# Patient Record
Sex: Male | Born: 1955 | Race: White | Hispanic: No | Marital: Married | Smoking: Former smoker
Health system: Southern US, Community
[De-identification: ages and names within clinical notes are randomized; demographics above are authoritative.]

## PROBLEM LIST (undated history)

## (undated) DIAGNOSIS — C61 Malignant neoplasm of prostate: Secondary | ICD-10-CM

## (undated) DIAGNOSIS — R351 Nocturia: Secondary | ICD-10-CM

## (undated) DIAGNOSIS — R399 Unspecified symptoms and signs involving the genitourinary system: Secondary | ICD-10-CM

## (undated) DIAGNOSIS — M1990 Unspecified osteoarthritis, unspecified site: Secondary | ICD-10-CM

## (undated) DIAGNOSIS — R350 Frequency of micturition: Secondary | ICD-10-CM

## (undated) DIAGNOSIS — R319 Hematuria, unspecified: Secondary | ICD-10-CM

## (undated) HISTORY — PX: PROSTATE BIOPSY: SHX241

## (undated) HISTORY — PX: ILEO CONDUIT: SHX1778

---

## 2009-05-13 ENCOUNTER — Emergency Department (HOSPITAL_COMMUNITY): Admission: EM | Admit: 2009-05-13 | Discharge: 2009-05-13 | Payer: Self-pay | Admitting: Emergency Medicine

## 2009-05-30 ENCOUNTER — Emergency Department (HOSPITAL_COMMUNITY): Admission: EM | Admit: 2009-05-30 | Discharge: 2009-05-30 | Payer: Self-pay | Admitting: Family Medicine

## 2012-01-08 ENCOUNTER — Emergency Department (HOSPITAL_COMMUNITY)
Admission: EM | Admit: 2012-01-08 | Discharge: 2012-01-08 | Disposition: A | Discharge status: Home / Self Care | Payer: Worker's Compensation | Attending: Emergency Medicine | Admitting: Emergency Medicine

## 2012-01-08 ENCOUNTER — Encounter (HOSPITAL_COMMUNITY): Payer: Self-pay

## 2012-01-08 DIAGNOSIS — W268XXA Contact with other sharp object(s), not elsewhere classified, initial encounter: Secondary | ICD-10-CM | POA: Insufficient documentation

## 2012-01-08 DIAGNOSIS — S61409A Unspecified open wound of unspecified hand, initial encounter: Secondary | ICD-10-CM | POA: Insufficient documentation

## 2012-01-08 DIAGNOSIS — IMO0002 Reserved for concepts with insufficient information to code with codable children: Secondary | ICD-10-CM

## 2012-01-08 MED ORDER — TETANUS-DIPHTH-ACELL PERTUSSIS 5-2.5-18.5 LF-MCG/0.5 IM SUSP
0.5000 mL | Freq: Once | INTRAMUSCULAR | Status: AC
  Administered 2012-01-08: 0.5 mL via INTRAMUSCULAR
  Filled 2012-01-08: qty 0.5

## 2012-01-08 NOTE — ED Notes (Signed)
When washing machine he cut the back of hand. X 2 1/2" lacerations on back of hand - behind rt. Pinky and rt. Ring finger; bleeding controlled.

## 2012-01-08 NOTE — Discharge Instructions (Signed)
Keep your wound clean and dry, you may wash it with soap and water.  Follow up with your Dr. in 2 weeks for suture removal.  Return for uncontrolled pain, spreading, swelling, fevers, or pus

## 2012-01-08 NOTE — ED Provider Notes (Signed)
History   This chart was scribed for Cheri Guppy, MD by Sofie Rower. The patient was seen in room TR05C/TR05C and the patient's care was started at 1:45 PM     CSN: [id number]  Arrival date & time 01/08/12  1234   First MD Initiated Contact with Patient 01/08/12 1344      Chief Complaint  Patient presents with  . Laceration    (Consider location/radiation/quality/duration/timing/severity/associated sxs/prior treatment) Patient is a 56 y.o. male presenting with skin laceration. The history is provided by the patient. No language interpreter was used.  Laceration  The incident occurred 3 to 5 hours ago. The laceration is located on the right hand. The laceration is 1 cm in size. The laceration mechanism was a a metal edge. The pain is moderate. The pain has been constant since onset. He reports no foreign bodies present.      History  Substance Use Topics  . Smoking status: Never Smoker   . Smokeless tobacco: Not on file  . Alcohol Use: Yes     occasionally      Review of Systems  Constitutional: Negative for fever.  Gastrointestinal: Negative for nausea and vomiting.  Skin: Positive for wound (Laceration). Negative for rash.    Allergies  Review of patient's allergies indicates no known allergies.  Home Medications  No current outpatient prescriptions on file.  BP 108/65  Pulse 87  Temp 97.6 F (36.4 C) (Oral)  Resp 12  SpO2 96%  Physical Exam  Nursing note and vitals reviewed. Constitutional: He appears well-developed and well-nourished.  Musculoskeletal: Normal range of motion. He exhibits no edema and no tenderness.        Two (2): 1 cm Lacerations over the 4th and 5th MCP joint on the right hand side. Clean, no bleeding.   Skin: Skin is warm and dry. No rash noted.  Psychiatric: He has a normal mood and affect. His behavior is normal.    ED Course  Procedures (including critical care time)  DIAGNOSTIC STUDIES: Oxygen Saturation is 96% on room  air, adequate by my interpretation.    COORDINATION OF CARE:  PROCEDURE Laceration repair X2 Location dorsum of left hand over 4-5 mcp. Superficial, clean, no active bleeding. No tendon nor nerve injuries. Betadine cleanse NS irrigation, moderate vol. With bulb syringe Lidocaine with epi anesthesia. 2 ml total Interrupted suture repair.  2 proline sutures in each lac, ( four total )  no complications.    Labs Reviewed - No data to display No results found.   No diagnosis found.    MDM  Left hand lac X2       I personally performed the services described in this documentation, which was scribed in my presence. The recorded information has been reviewed and considered.      Cheri Guppy, MD 01/08/12 1459

## 2012-01-22 ENCOUNTER — Emergency Department (HOSPITAL_COMMUNITY)
Admission: EM | Admit: 2012-01-22 | Discharge: 2012-01-22 | Disposition: A | Discharge status: Home / Self Care | Payer: Worker's Compensation | Attending: Emergency Medicine | Admitting: Emergency Medicine

## 2012-01-22 ENCOUNTER — Encounter (HOSPITAL_COMMUNITY): Payer: Self-pay | Admitting: Emergency Medicine

## 2012-01-22 DIAGNOSIS — IMO0002 Reserved for concepts with insufficient information to code with codable children: Secondary | ICD-10-CM

## 2012-01-22 DIAGNOSIS — Z4802 Encounter for removal of sutures: Secondary | ICD-10-CM | POA: Insufficient documentation

## 2012-01-22 NOTE — ED Notes (Signed)
Pt here for suture removal to R hand.  No complaints.

## 2012-01-22 NOTE — ED Notes (Signed)
Patient returns to have the sutures in his right hand removed.

## 2012-01-22 NOTE — ED Provider Notes (Signed)
History   This chart was scribed for Howard B. Bernette Mayers, MD by Toya Smothers. The patient was seen in room TR07C/TR07C. Patient's care was started at 1830.  CSN: [id number]  Arrival date & time 01/22/12  1830   First MD Initiated Contact with Patient 01/22/12 1853      Chief Complaint  Patient presents with  . Suture / Staple Removal   HPI  Howard Valentine is a 56 y.o. male who presents to the Emergency Department for remove sutures. Pt sutures for laceration to the  R hand 2 weeks ago. Pt is asymptomatic and lacerations are healing well.   History reviewed. No pertinent past medical history.  History reviewed. No pertinent past surgical history.  No family history on file.  History  Substance Use Topics  . Smoking status: Never Smoker   . Smokeless tobacco: Not on file  . Alcohol Use: Yes     occasionally    Review of Systems  Constitutional: Negative for fever and chills.  HENT: Negative for rhinorrhea and neck pain.   Eyes: Negative for pain.  Respiratory: Negative for cough and shortness of breath.   Cardiovascular: Negative for chest pain.  Gastrointestinal: Negative for nausea, vomiting, abdominal pain and diarrhea.  Genitourinary: Negative for dysuria.  Musculoskeletal: Negative for back pain.  Skin: Positive for wound (Healing laceration). Negative for rash.  Neurological: Negative for dizziness and weakness.  All other systems reviewed and are negative.    Allergies  Review of patient's allergies indicates no known allergies.  Home Medications  No current outpatient prescriptions on file.  BP 129/78  Pulse 86  Temp 98.2 F (36.8 C) (Oral)  Resp 20  SpO2 99%  Physical Exam  Nursing note and vitals reviewed. Constitutional: He is oriented to person, place, and time. He appears well-developed and well-nourished. No distress.  HENT:  Head: Normocephalic and atraumatic.  Eyes: EOM are normal. Pupils are equal, round, and reactive to light.  Neck: Neck  supple. No tracheal deviation present.  Cardiovascular: Normal rate.   Pulmonary/Chest: Effort normal. No respiratory distress.  Abdominal: Soft. He exhibits no distension.  Musculoskeletal: Normal range of motion. He exhibits no edema.  Neurological: He is alert and oriented to person, place, and time. No sensory deficit.  Skin: Skin is warm and dry.       2 small lacerations on the dorsal R hand without signs of infection.  Psychiatric: He has a normal mood and affect. His behavior is normal.    ED Course  Procedures (including critical care time) DIAGNOSTIC STUDIES: Oxygen Saturation is 99% on room air, normal by my interpretation.    COORDINATION OF CARE: 1856- Evaluated Pt and removed sutures.   Labs Reviewed - No data to display No results found.   No diagnosis found.    MDM  Sutures removed.      I personally performed the services described in the documentation, which were scribed in my presence. The recorded information has been reviewed and considered.     Howard B. Bernette Mayers, MD 01/22/12 6213

## 2012-11-22 ENCOUNTER — Other Ambulatory Visit (HOSPITAL_COMMUNITY): Payer: Self-pay | Admitting: Urology

## 2012-11-22 DIAGNOSIS — C61 Malignant neoplasm of prostate: Secondary | ICD-10-CM

## 2012-12-02 ENCOUNTER — Other Ambulatory Visit (HOSPITAL_COMMUNITY): Payer: Self-pay | Admitting: Urology

## 2012-12-02 ENCOUNTER — Ambulatory Visit (HOSPITAL_COMMUNITY)
Admission: RE | Admit: 2012-12-02 | Discharge: 2012-12-02 | Disposition: A | Discharge status: Home / Self Care | Payer: BC Managed Care – PPO | Source: Ambulatory Visit | Attending: Urology | Admitting: Urology

## 2012-12-02 DIAGNOSIS — S22009A Unspecified fracture of unspecified thoracic vertebra, initial encounter for closed fracture: Secondary | ICD-10-CM | POA: Insufficient documentation

## 2012-12-02 DIAGNOSIS — C61 Malignant neoplasm of prostate: Secondary | ICD-10-CM

## 2012-12-02 DIAGNOSIS — C7951 Secondary malignant neoplasm of bone: Secondary | ICD-10-CM | POA: Insufficient documentation

## 2012-12-02 DIAGNOSIS — X58XXXA Exposure to other specified factors, initial encounter: Secondary | ICD-10-CM | POA: Insufficient documentation

## 2012-12-02 MED ORDER — GADOBENATE DIMEGLUMINE 529 MG/ML IV SOLN
12.0000 mL | Freq: Once | INTRAVENOUS | Status: AC | PRN
  Administered 2012-12-02: 12 mL via INTRAVENOUS

## 2013-02-03 ENCOUNTER — Encounter (HOSPITAL_COMMUNITY): Payer: Self-pay | Admitting: Pharmacy Technician

## 2013-02-03 ENCOUNTER — Other Ambulatory Visit: Payer: Self-pay | Admitting: Urology

## 2013-02-03 NOTE — Patient Instructions (Addendum)
Howard Valentine  02/03/2013   Your procedure is scheduled on:  02/08/13                Surgery 0830am-222pm  Report to [facility] at    0630 AM.  Call this number if you have problems the morning of surgery: [phone]   Remember:   Do not eat food or drink liquids after midnight.   Take these medicines the morning of surgery with A SIP OF WATER:    Do not wear jewelry,  Do not wear lotions, powders, or perfumes.   . Men may shave face and neck.  Do not bring valuables to the hospital.  Contacts, dentures or bridgework may not be worn into surgery.  Leave suitcase in the car. After surgery it may be brought to your room.  For patients admitted to the hospital, checkout time is 11:00 AM the day of  discharge.       SEE CHG INSTRUCTION SHEET    Please read over the following fact sheets that you were given: , coughing and deep breathing exercises, leg exercises, Blood Transfusion Fact sheet                Failure to comply with these instructions may result in cancellation of your surgery.                Patient Signature ____________________________              Nurse Signature _____________________________

## 2013-02-06 ENCOUNTER — Encounter (HOSPITAL_COMMUNITY): Payer: Self-pay

## 2013-02-06 ENCOUNTER — Encounter (HOSPITAL_COMMUNITY)
Admission: RE | Admit: 2013-02-06 | Discharge: 2013-02-06 | Disposition: A | Discharge status: Home / Self Care | Payer: BC Managed Care – PPO | Source: Ambulatory Visit | Attending: Urology | Admitting: Urology

## 2013-02-06 HISTORY — DX: Unspecified osteoarthritis, unspecified site: M19.90

## 2013-02-06 LAB — CBC
HCT: 40.6 % (ref 39.0–52.0)
Platelets: 232 10*3/uL (ref 150–400)
WBC: 7.9 10*3/uL (ref 4.0–10.5)

## 2013-02-06 LAB — BASIC METABOLIC PANEL
BUN: 8 mg/dL (ref 6–23)
Chloride: 95 mEq/L — ABNORMAL LOW (ref 96–112)
GFR calc non Af Amer: >90 mL/min (ref >90)
Glucose, Bld: 89 mg/dL (ref 70–99)

## 2013-02-06 LAB — ABO/RH: ABO/RH(D): A POS

## 2013-02-08 ENCOUNTER — Ambulatory Visit (HOSPITAL_COMMUNITY)
Admission: RE | Admit: 2013-02-08 | Discharge: 2013-02-09 | Disposition: A | Discharge status: Home / Self Care | Payer: BC Managed Care – PPO | Source: Ambulatory Visit | Attending: Urology | Admitting: Urology

## 2013-02-08 ENCOUNTER — Encounter (HOSPITAL_COMMUNITY): Payer: Self-pay | Admitting: *Deleted

## 2013-02-08 ENCOUNTER — Encounter (HOSPITAL_COMMUNITY): Payer: Self-pay | Admitting: Anesthesiology

## 2013-02-08 ENCOUNTER — Encounter (HOSPITAL_COMMUNITY)
Admission: RE | Disposition: A | Discharge status: Home / Self Care | Payer: Self-pay | Source: Ambulatory Visit | Attending: Urology

## 2013-02-08 ENCOUNTER — Ambulatory Visit (HOSPITAL_COMMUNITY): Payer: BC Managed Care – PPO | Admitting: Anesthesiology

## 2013-02-08 DIAGNOSIS — C61 Malignant neoplasm of prostate: Secondary | ICD-10-CM | POA: Insufficient documentation

## 2013-02-08 DIAGNOSIS — Z01812 Encounter for preprocedural laboratory examination: Secondary | ICD-10-CM | POA: Insufficient documentation

## 2013-02-08 HISTORY — PX: LYMPHADENECTOMY: SHX5960

## 2013-02-08 HISTORY — PX: ROBOT ASSISTED LAPAROSCOPIC RADICAL PROSTATECTOMY: SHX5141

## 2013-02-08 LAB — CBC
MCH: 33 pg (ref 26.0–34.0)
Platelets: 203 10*3/uL (ref 150–400)
RBC: 4.03 MIL/uL — ABNORMAL LOW (ref 4.22–5.81)

## 2013-02-08 LAB — TYPE AND SCREEN
ABO/RH(D): A POS
Antibody Screen: NEGATIVE

## 2013-02-08 LAB — CREATININE, SERUM: Creatinine, Ser: 0.74 mg/dL (ref 0.50–1.35)

## 2013-02-08 SURGERY — ROBOTIC ASSISTED LAPAROSCOPIC RADICAL PROSTATECTOMY LEVEL 2
Anesthesia: General | Wound class: Clean Contaminated

## 2013-02-08 MED ORDER — ACETAMINOPHEN 10 MG/ML IV SOLN
1000.0000 mg | Freq: Four times a day (QID) | INTRAVENOUS | Status: DC
  Administered 2013-02-08 – 2013-02-09 (×3): 1000 mg via INTRAVENOUS
  Filled 2013-02-08 (×4): qty 100

## 2013-02-08 MED ORDER — HEPARIN SODIUM (PORCINE) 5000 UNIT/ML IJ SOLN
5000.0000 [IU] | INTRAMUSCULAR | Status: AC
  Administered 2013-02-08: 5000 [IU] via SUBCUTANEOUS
  Filled 2013-02-08: qty 1

## 2013-02-08 MED ORDER — NEOSTIGMINE METHYLSULFATE 1 MG/ML IJ SOLN
INTRAMUSCULAR | Status: DC | PRN
  Administered 2013-02-08: 4 mg via INTRAVENOUS

## 2013-02-08 MED ORDER — HYDROMORPHONE HCL PF 1 MG/ML IJ SOLN
0.2500 mg | INTRAMUSCULAR | Status: DC | PRN
  Administered 2013-02-08: 0.5 mg via INTRAVENOUS

## 2013-02-08 MED ORDER — ACETAMINOPHEN 325 MG PO TABS: 650.0000 mg | ORAL_TABLET | ORAL | Status: DC | PRN

## 2013-02-08 MED ORDER — SODIUM CHLORIDE 0.9 % IV BOLUS (SEPSIS)
1000.0000 mL | Freq: Once | INTRAVENOUS | Status: AC
  Administered 2013-02-08: 1000 mL via INTRAVENOUS

## 2013-02-08 MED ORDER — DEXAMETHASONE SODIUM PHOSPHATE 10 MG/ML IJ SOLN
INTRAMUSCULAR | Status: DC | PRN
  Administered 2013-02-08: 10 mg via INTRAVENOUS

## 2013-02-08 MED ORDER — ONDANSETRON HCL 4 MG/2ML IJ SOLN: 4.0000 mg | INTRAMUSCULAR | Status: DC | PRN

## 2013-02-08 MED ORDER — SODIUM CHLORIDE 0.9 % IR SOLN
Status: DC | PRN
  Administered 2013-02-08: 1000 mL

## 2013-02-08 MED ORDER — HEPARIN SODIUM (PORCINE) 5000 UNIT/ML IJ SOLN
5000.0000 [IU] | Freq: Three times a day (TID) | INTRAMUSCULAR | Status: DC
  Administered 2013-02-08 – 2013-02-09 (×2): 5000 [IU] via SUBCUTANEOUS
  Filled 2013-02-08 (×5): qty 1

## 2013-02-08 MED ORDER — HEPARIN SODIUM (PORCINE) 1000 UNIT/ML IJ SOLN
INTRAMUSCULAR | Status: AC
  Filled 2013-02-08: qty 1

## 2013-02-08 MED ORDER — MIDAZOLAM HCL 5 MG/5ML IJ SOLN
INTRAMUSCULAR | Status: DC | PRN
  Administered 2013-02-08: 2 mg via INTRAVENOUS

## 2013-02-08 MED ORDER — GLYCOPYRROLATE 0.2 MG/ML IJ SOLN
INTRAMUSCULAR | Status: DC | PRN
  Administered 2013-02-08: .6 mg via INTRAVENOUS

## 2013-02-08 MED ORDER — FENTANYL CITRATE 0.05 MG/ML IJ SOLN
INTRAMUSCULAR | Status: DC | PRN
  Administered 2013-02-08: 50 ug via INTRAVENOUS
  Administered 2013-02-08: 100 ug via INTRAVENOUS
  Administered 2013-02-08 (×7): 50 ug via INTRAVENOUS

## 2013-02-08 MED ORDER — INDIGOTINDISULFONATE SODIUM 8 MG/ML IJ SOLN
INTRAMUSCULAR | Status: AC
  Filled 2013-02-08: qty 10

## 2013-02-08 MED ORDER — SENNOSIDES-DOCUSATE SODIUM 8.6-50 MG PO TABS
1.0000 | ORAL_TABLET | Freq: Two times a day (BID) | ORAL | Status: DC
  Administered 2013-02-08: 1 via ORAL
  Filled 2013-02-08 (×3): qty 1

## 2013-02-08 MED ORDER — ACETAMINOPHEN 160 MG/5ML PO SOLN: 650.0000 mg | ORAL | Status: DC | PRN

## 2013-02-08 MED ORDER — BISACODYL 10 MG RE SUPP
10.0000 mg | Freq: Two times a day (BID) | RECTAL | Status: DC
  Administered 2013-02-08 – 2013-02-09 (×2): 10 mg via RECTAL
  Filled 2013-02-08 (×2): qty 1

## 2013-02-08 MED ORDER — LACTATED RINGERS IV SOLN
INTRAVENOUS | Status: DC | PRN
  Administered 2013-02-08: 09:00:00

## 2013-02-08 MED ORDER — HYDROMORPHONE HCL PF 1 MG/ML IJ SOLN
INTRAMUSCULAR | Status: AC
  Filled 2013-02-08: qty 1

## 2013-02-08 MED ORDER — OXYCODONE-ACETAMINOPHEN 5-325 MG/5ML PO SOLN: 5.0000 mL | ORAL | Status: DC | PRN

## 2013-02-08 MED ORDER — BACITRACIN-NEOMYCIN-POLYMYXIN 400-5-5000 EX OINT: 1.0000 "application " | TOPICAL_OINTMENT | Freq: Three times a day (TID) | CUTANEOUS | Status: DC | PRN

## 2013-02-08 MED ORDER — MORPHINE SULFATE 2 MG/ML IJ SOLN
2.0000 mg | INTRAMUSCULAR | Status: DC | PRN
  Administered 2013-02-08: 4 mg via INTRAVENOUS
  Filled 2013-02-08: qty 2

## 2013-02-08 MED ORDER — SODIUM CHLORIDE 0.9 % IJ SOLN: 3.0000 mL | Freq: Two times a day (BID) | INTRAMUSCULAR | Status: DC

## 2013-02-08 MED ORDER — INDIGOTINDISULFONATE SODIUM 8 MG/ML IJ SOLN
INTRAMUSCULAR | Status: DC | PRN
  Administered 2013-02-08 (×2): 5 mL via INTRAVENOUS

## 2013-02-08 MED ORDER — CEFAZOLIN SODIUM-DEXTROSE 2-3 GM-% IV SOLR
2.0000 g | INTRAVENOUS | Status: AC
  Administered 2013-02-08: 2 g via INTRAVENOUS

## 2013-02-08 MED ORDER — HYOSCYAMINE SULFATE 0.125 MG SL SUBL
0.1250 mg | SUBLINGUAL_TABLET | SUBLINGUAL | Status: DC | PRN
  Filled 2013-02-08: qty 1

## 2013-02-08 MED ORDER — SODIUM CHLORIDE 0.9 % IV SOLN
INTRAVENOUS | Status: DC
  Administered 2013-02-08 – 2013-02-09 (×2): via INTRAVENOUS

## 2013-02-08 MED ORDER — KETOROLAC TROMETHAMINE 30 MG/ML IJ SOLN: 15.0000 mg | Freq: Once | INTRAMUSCULAR | Status: DC | PRN

## 2013-02-08 MED ORDER — BUPIVACAINE-EPINEPHRINE PF 0.25-1:200000 % IJ SOLN
INTRAMUSCULAR | Status: AC
  Filled 2013-02-08: qty 30

## 2013-02-08 MED ORDER — PROMETHAZINE HCL 25 MG/ML IJ SOLN: 6.2500 mg | INTRAMUSCULAR | Status: DC | PRN

## 2013-02-08 MED ORDER — ROCURONIUM BROMIDE 100 MG/10ML IV SOLN
INTRAVENOUS | Status: DC | PRN
  Administered 2013-02-08 (×4): 10 mg via INTRAVENOUS
  Administered 2013-02-08: 40 mg via INTRAVENOUS
  Administered 2013-02-08: 20 mg via INTRAVENOUS
  Administered 2013-02-08 (×2): 10 mg via INTRAVENOUS

## 2013-02-08 MED ORDER — BUPIVACAINE LIPOSOME 1.3 % IJ SUSP
20.0000 mL | Freq: Once | INTRAMUSCULAR | Status: DC
  Filled 2013-02-08: qty 20

## 2013-02-08 MED ORDER — PROPOFOL 10 MG/ML IV BOLUS
INTRAVENOUS | Status: DC | PRN
  Administered 2013-02-08: 140 mg via INTRAVENOUS

## 2013-02-08 MED ORDER — ONDANSETRON HCL 4 MG/2ML IJ SOLN
INTRAMUSCULAR | Status: DC | PRN
  Administered 2013-02-08: 4 mg via INTRAVENOUS

## 2013-02-08 MED ORDER — SODIUM CHLORIDE 0.9 % IJ SOLN: 3.0000 mL | INTRAMUSCULAR | Status: DC | PRN

## 2013-02-08 MED ORDER — CEFAZOLIN SODIUM-DEXTROSE 2-3 GM-% IV SOLR
INTRAVENOUS | Status: AC
Start: 2013-02-08 — End: 2013-02-08
  Filled 2013-02-08: qty 50

## 2013-02-08 MED ORDER — LACTATED RINGERS IV SOLN
INTRAVENOUS | Status: DC
  Administered 2013-02-08: 1000 mL via INTRAVENOUS
  Administered 2013-02-08: 10:00:00 via INTRAVENOUS

## 2013-02-08 MED ORDER — CEFAZOLIN SODIUM-DEXTROSE 2-3 GM-% IV SOLR
2.0000 g | Freq: Three times a day (TID) | INTRAVENOUS | Status: AC
  Administered 2013-02-08 – 2013-02-09 (×2): 2 g via INTRAVENOUS
  Filled 2013-02-08 (×2): qty 50

## 2013-02-08 MED ORDER — BUPIVACAINE LIPOSOME 1.3 % IJ SUSP
INTRAMUSCULAR | Status: DC | PRN
  Administered 2013-02-08: 20 mL

## 2013-02-08 MED ORDER — CEFAZOLIN SODIUM-DEXTROSE 2-3 GM-% IV SOLR
INTRAVENOUS | Status: AC
  Filled 2013-02-08: qty 50

## 2013-02-08 MED ORDER — SODIUM CHLORIDE 0.9 % IV SOLN: 250.0000 mL | INTRAVENOUS | Status: DC | PRN

## 2013-02-08 SURGICAL SUPPLY — 49 items
APPLIER CLIP 5 13 M/L LIGAMAX5 (MISCELLANEOUS) ×3
CANISTER SUCTION 2500CC (MISCELLANEOUS) ×3 IMPLANT
CATH FOLEY 2WAY SLVR 30CC 16FR (CATHETERS) ×3 IMPLANT
CATH ROBINSON RED A/P 16FR (CATHETERS) ×3 IMPLANT
CATH ROBINSON RED A/P 8FR (CATHETERS) ×3 IMPLANT
CATH TIEMANN FOLEY 18FR 5CC (CATHETERS) ×3 IMPLANT
CHLORAPREP W/TINT 26ML (MISCELLANEOUS) ×3 IMPLANT
CLIP APPLIE 5 13 M/L LIGAMAX5 (MISCELLANEOUS) ×2 IMPLANT
CLIP LIGATING HEM O LOK PURPLE (MISCELLANEOUS) ×12 IMPLANT
CLOTH BEACON ORANGE TIMEOUT ST (SAFETY) ×3 IMPLANT
CORD HIGH FREQUENCY UNIPOLAR (ELECTROSURGICAL) ×3 IMPLANT
CORDS BIPOLAR (ELECTRODE) ×3 IMPLANT
COVER SURGICAL LIGHT HANDLE (MISCELLANEOUS) ×3 IMPLANT
COVER TIP SHEARS 8 DVNC (MISCELLANEOUS) ×2 IMPLANT
COVER TIP SHEARS 8MM DA VINCI (MISCELLANEOUS) ×1
CUTTER ECHEON FLEX ENDO 45 340 (ENDOMECHANICALS) ×3 IMPLANT
DECANTER SPIKE VIAL GLASS SM (MISCELLANEOUS) IMPLANT
DRAPE SURG IRRIG POUCH 19X23 (DRAPES) ×3 IMPLANT
DRSG TEGADERM 6X8 (GAUZE/BANDAGES/DRESSINGS) ×6 IMPLANT
ELECT REM PT RETURN 9FT ADLT (ELECTROSURGICAL) ×3
ELECTRODE REM PT RTRN 9FT ADLT (ELECTROSURGICAL) ×2 IMPLANT
GAUZE SPONGE 2X2 8PLY STRL LF (GAUZE/BANDAGES/DRESSINGS) ×2 IMPLANT
GLOVE BIOGEL M 7.0 STRL (GLOVE) IMPLANT
GLOVE BIOGEL PI IND STRL 7.5 (GLOVE) ×2 IMPLANT
GLOVE BIOGEL PI INDICATOR 7.5 (GLOVE) ×1
GLOVE ECLIPSE 7.0 STRL STRAW (GLOVE) ×3 IMPLANT
GOWN PREVENTION PLUS XLARGE (GOWN DISPOSABLE) ×6 IMPLANT
GOWN STRL NON-REIN LRG LVL3 (GOWN DISPOSABLE) ×6 IMPLANT
GOWN STRL REIN XL XLG (GOWN DISPOSABLE) ×6 IMPLANT
HOLDER FOLEY CATH W/STRAP (MISCELLANEOUS) ×3 IMPLANT
IV LACTATED RINGERS 1000ML (IV SOLUTION) IMPLANT
KIT ACCESSORY DA VINCI DISP (KITS) ×1
KIT ACCESSORY DVNC DISP (KITS) ×2 IMPLANT
NDL SAFETY ECLIPSE 18X1.5 (NEEDLE) ×2 IMPLANT
NEEDLE HYPO 18GX1.5 SHARP (NEEDLE) ×1
PACK ROBOT UROLOGY CUSTOM (CUSTOM PROCEDURE TRAY) ×3 IMPLANT
RELOAD GREEN ECHELON 45 (STAPLE) ×3 IMPLANT
SCRUB PCMX 4 OZ (MISCELLANEOUS) IMPLANT
SET TUBE IRRIG SUCTION NO TIP (IRRIGATION / IRRIGATOR) ×3 IMPLANT
SOLUTION ELECTROLUBE (MISCELLANEOUS) ×3 IMPLANT
SPONGE GAUZE 2X2 STER 10/PKG (GAUZE/BANDAGES/DRESSINGS) ×1
SUT V-LOC BARB 180 2/0GR6 GS22 (SUTURE) ×3
SUT VICRYL 0 UR6 27IN ABS (SUTURE) ×6 IMPLANT
SUTURE V-LC BRB 180 2/0GR6GS22 (SUTURE) ×2 IMPLANT
SYR 27GX1/2 1ML LL SAFETY (SYRINGE) ×3 IMPLANT
TOWEL OR 17X26 10 PK STRL BLUE (TOWEL DISPOSABLE) ×3 IMPLANT
TOWEL OR NON WOVEN STRL DISP B (DISPOSABLE) ×3 IMPLANT
TROCAR 12M 150ML BLUNT (TROCAR) IMPLANT
WATER STERILE IRR 1500ML POUR (IV SOLUTION) ×6 IMPLANT

## 2013-02-08 NOTE — Preoperative (Signed)
Beta Blockers   Reason not to administer Beta Blockers:Not Applicable 

## 2013-02-08 NOTE — Transfer of Care (Signed)
Immediate Anesthesia Transfer of Care Note  Patient: Howard Valentine  Procedure(s) Performed: Procedure(s) with comments: ROBOTIC ASSISTED LAPAROSCOPIC RADICAL PROSTATECTOMY LEVEL 2 (N/A) - ROBOTIC PROSTATECTOMY, BILATERAL PELVIC LYMPH NODE DISSECTION, RIGHT NON NERVE SPARING  LYMPHADENECTOMY (Bilateral)  Patient Location: PACU  Anesthesia Type:General  Level of Consciousness: awake, alert , oriented and patient cooperative  Airway & Oxygen Therapy: Patient Spontanous Breathing and Patient connected to face mask oxygen  Post-op Assessment: Report given to PACU RN and Post -op Vital signs reviewed and stable  Post vital signs: Reviewed and stable  Complications: No apparent anesthesia complications

## 2013-02-08 NOTE — Progress Notes (Signed)
Report received from Carlyn Reichert RN, agree with previous assessment.

## 2013-02-08 NOTE — Care Management Note (Addendum)
    Page 1 of 1   02/09/2013     11:25:55 AM   CARE MANAGEMENT NOTE 02/09/2013  Patient:  AMARIS, GARRETTE   Account Number:  [id number]  Date Initiated:  02/08/2013  Documentation initiated by:  Lanier Clam  Subjective/Objective Assessment:   ADMITTED W/PROSTATE CA.     Action/Plan:   FROM HOME.HAS PCP,PHARMACY.   Anticipated DC Date:  02/09/2013   Anticipated DC Plan:  HOME/SELF CARE      DC Planning Services  CM consult      Choice offered to / List presented to:             Status of service:  Completed, signed off Medicare Important Message given?   (If response is "NO", the following Medicare IM given date fields will be blank) Date Medicare IM given:   Date Additional Medicare IM given:    Discharge Disposition:  HOME/SELF CARE  Per UR Regulation:  Reviewed for med. necessity/level of care/duration of stay  If discussed at Long Length of Stay Meetings, dates discussed:    Comments:  02/09/13 Mikinzie Maciejewski RN,BSN NCM [phone] D/C HOME NO ORDERS OR NEEDS.  02/08/13 Kalyiah Saintil RN,BSN NCM [phone] S/P LAP RADICAL PROSTATECTOMY.

## 2013-02-08 NOTE — Anesthesia Postprocedure Evaluation (Signed)
  Anesthesia Post-op Note  Patient: Howard Valentine  Procedure(s) Performed: Procedure(s) (LRB): ROBOTIC ASSISTED LAPAROSCOPIC RADICAL PROSTATECTOMY LEVEL 2 (N/A) LYMPHADENECTOMY (Bilateral)  Patient Location: PACU  Anesthesia Type: General  Level of Consciousness: awake and alert   Airway and Oxygen Therapy: Patient Spontanous Breathing  Post-op Pain: mild  Post-op Assessment: Post-op Vital signs reviewed, Patient's Cardiovascular Status Stable, Respiratory Function Stable, Patent Airway and No signs of Nausea or vomiting  Last Vitals:  Filed Vitals:   02/08/13 1400  BP: 135/70  Pulse: 91  Temp: 36.4 C  Resp: 13    Post-op Vital Signs: stable   Complications: No apparent anesthesia complications

## 2013-02-08 NOTE — Op Note (Signed)
DATE OF PROCEDURE: 02/08/13   OPERATIVE REPORT  SURGEON: Natalia Leatherwood, MD  ASSISTANT:  Pecola Leisure, PA   PREOPERATIVE DIAGNOSIS: Prostate cancer.  POSTOPERATIVE DIAGNOSIS: Prostate cancer.   PROCEDURE PERFORMED:  Robotic-assisted laparoscopic radical prostatectomy (Bilateral non-nerve sparing.) Bilateral pelvic lymph node dissection.   ESTIMATED BLOOD LOSS: 150 cc.  DRAINS: Jackson-Pratt drain, left lower quadrant and Foley catheter.   SPECIMEN: Prostate sent with seminal vesicles in its entirety. Tip of left seminal vesicles sent separately. 3 frozen section sent during surgery including right pelvic sidewall near the apex of the prostate, right base of prostate/bladder neck, and left prostatic pedicle.  FINDINGS:  Frozen section of all 3 listed above specimen returned negative.    HISTORY OF PRESENT ILLNESS: Patient was discovered to have prostate cancer. Staging revealed concern over extraprostatic extension. After evaluation and discussion of management options, he elected for surgical extirpation of his prostate.   PROCEDURE IN DETAIL: Informed consent was obtained. He received subcutaneous heparin for DVT prophylaxis before being taken to the OR. The patient was taken to the operating room, where he was placed in the supine position. IV antibiotics were infused and general anesthesia was induced. A time- out was performed, in which the correct patient, surgical site, and procedure were identified and agreed upon by the team. Hair was removed  from his abdomen and genitals and then he was placed in a dorsal  lithotomy position. All pertinent neurovascular pressure points were padded appropriately. His arms were tucked to the side using gel padding. After this, his abdomen and genitals were prepped and draped in the usual sterile fashion. A Foley catheter was placed on the field, 10 cc of sterile water was placed into the balloon.   He was then placed in a Trendelenburg  position. Access was gained for laparoscopic procedure by using the Hasson technique by cutting down to the fascia in the midline above the umbilicus. Once the peritoneum was accessed, a 10 mm  port was placed and abdomen was insufflated with carbon dioxide. Opening pressures were initially low so insufflation was continued. Next, markings were made for placement of the 1st & 3rd robotic arm ports in the usual areas with the ports being placed approximately 10 cm from the midline on either  side of camera (2nd) arm.  A 10 mm assistant port was placed on the far right lateral side of the abdomen. A 5 mm assistant port was placed between the right robotic (1st arm) and the camera port (2nd arm). The 4th robotic arm port was placed at the far left lateral side of the abdomen. All these were placed under direct visualization, and the robot was docked to the robotic ports.  After this was done, the urachal remnant was identified and divided and the bladder was  Dissected from the anterior wall of the abdomen. This was taken down to  the endopelvic fascia bilaterally and the peritoneum was split down to the  vas deferens bilaterally. The vas deferens was divided with bipolar and monopolar electrocautery. After this was done, the prograsp was used to  retract the bladder cephalad.   Attention was turned to the right pelvis. The peritoneum was incised until the right external iliac vein was identified. Careful dissection was performed to release the nodal packet lateral to the vein. Dissection was then carried out inferior and medially to the vein until the lateral pelvic wall was encountered. Gentle dissection was then carried proximally and distally along the vein. The obturator nerve was  identified early at its proximal and distal ends. Dissection of this packet was carried out making sure not to cause injury to the obturator nerve. Hemostasis and lymph vessels were controlled with Weck clips. The lymph node  packet was then removed and sent for permanent pathology. Dissection of the tissue between the left external iliac vein and left iliac artery as well as lateral to the iliac artery was then dissected. I made sure to avoid the genitofemoral nerve. This tissue was removed and hemostasis was maintained with wet clips. This was sent as external iliac lymph node packages.  Attention was turned to the left pelvis. The peritoneum was incised until the left external iliac vein was identified. Careful dissection was performed to release the nodal packet lateral to the vein. Dissection was then carried out inferior and medially to the vein until the lateral pelvic wall was encountered. Gentle dissection was then carried proximally and distally along the vein. The obturator nerve was identified early at its proximal and distal ends. Dissection of this packet was carried out making sure not to cause injury to the obturator nerve. Hemostasis and lymph vessels were controlled with weck clips. The lymph node packet was then removed and sent for permanent pathology. Dissection of the tissue between the right external iliac vein and right iliac artery as well as lateral to the iliac artery was then dissected. I made sure to avoid the genitofemoral nerve. This tissue was removed and hemostasis was maintained with wet clips. This was sent as external iliac lymph node packages.   After this was done, the endopelvic fascia was incised  bilaterally and carried anterior and posteriorly bilaterally. The  puboprostatic ligaments were then divided and then the stapler device  was used to divide and ligate the dorsal venous complex.   There was noted to be adherent tissue at the right apex to the lateral pelvic wall. After division of this area additional tissue was taken off of the lateral pelvic wall and sent for frozen pathology. This returned negative for cancer.  After this was complete, attention was turned back to the  junction between the prostate and bladder neck.  Dissection was carried down between the prostate and the bladder until  the Foley catheter was encountered. It was deflated and then placed  through the dissection site and then anterior traction was placed by  grasping the catheter with the 4th robot arm. The remainder of the bladder neck  was then dissected out, and dissection was carried down posteriorly making sure to avoid reentry into the bladder until the seminal vesicles were encountered.   The seminal vesicles and vas deferens were then dissected out completely bilaterally. After  these were done, they were placed on traction anteriorly and blunt  dissection was carried out between the rectum and the prostate. During this dissection the tip of the left seminal vesicle was torn off the specimen and this was sent separately for permanent pathology.  Due to the large amount cancer in his right base and concerning findings on MRI after the bladder was divided off of the prostate additional tissue was taken off of the bladder neck and sent for frozen pathology. This returned negative for cancer.  Attention was turned to the left prostatic pedicle. It was evaluated and found to have no good surgical plane.  It was felt that further attempt at nerve sparing on this left side would jeopardize cancer control and a wide dissection of this side was carried out. The left  prostatic pedicle was controlled with sharp dissection and Hem-o-lok clips. Additional tissue was removed from the pelvis at the left pedicle and sent for frozen section and returned negative for cancer.  Attention was turned to the right prostatic pedicle. It was evaluated and found to have no good surgical plane.  It was felt that further attempt at nerve sparing on this right side would jeopardize cancer control and a wide dissection of this side was carried out. The right prostatic pedicle was controlled with sharp dissection and  Hem-o-lok clips.    Dissection was then carried up to the urethra which was all that remained attaching the prostate to the body. The urethra was  then divided with scissors and the prostate was placed to the side.   The pelvis was irrigated and flushing of air into the rectal catheter showed no air bubbles.   I was able to visualize efflux from both ureter orifices. After this, anastomosis of the bladder neck to the urethra was carried out. A 3-0 V-lock suture was used to approximate the posterior bladder to the posterior urethra.   Double-armed Monocryl suture was used in a running fashion to perform the anastomosis. I was careful to ensure mucosal to mucosal approximation.  After this was done, a Foley catheter was placed through the anastomosis and into the bladder. It was irrigated and found to be water tight. It was tied down and the suture needles were removed from the body.   A Jackson-Pratt drain was placed through the port of the 4th robot arm. The specimen was placed in the EndoCatch bag. The robot was  undocked and the operating table was leveled. The EndoCatch bag was removed from the body by enlarging  the midline of the umbilicus. All ports were checked and found to be  free of bleeding. The 10 mm assistant port was closed with a suture  passer under direct visualization. The fascia of the umbilical port was close with interrupted vicryl suture in a figure-of-eight fashion until there was no defect palpable. Care was taken to avoid incorporation of intra-abdominal contents into the closure. After this was done, the drain was sutured into place, and the local injection with Exparel was performed. All wounds were irrigated with  sterile normal saline and then closed with staples.  Tegaderm and drain gauze was placed over the Jackson-Pratt drain & incision sites, and  Foley catheter was placed to closed drainage. The patient was placed  back in supine position. Anesthesia was  reversed. He was taken to PACU  in stable condition. He will be kept overnight for evaluation.    All counts were correct at the end of the case.

## 2013-02-08 NOTE — Progress Notes (Signed)
GU Post-op check  Pain controlled.  We discussed the surgical findings and course of the surgery.  Filed Vitals:   02/08/13 1420  BP: 134/72  Pulse:   Temp: 97.5 F (36.4 C)  Resp: 17    Gen: NAD, AAO Abd: soft, appropriately TTP GU: foley draining appropriately  Hgb: 13.4  A/P: Prostate cancer Robotic prostatectomy today.  -IV fluids. -Ambulate today. -Regular diet. -Incentive spirometry. -Heparin

## 2013-02-08 NOTE — Progress Notes (Signed)
Stood at bedside, queasy, shaking, pale. Sat back down for a few minutes, then stood and walked 4 steps and back to bed.

## 2013-02-08 NOTE — H&P (Signed)
Urology History and Physical Exam  CC: Prostate cancer  HPI:   57 year old male presents today for prostate cancer. This was discovered based on elevated PSA of 15.2 on 10/18/12. He underwent prostate biopsy which revealed Gleason score 3+3 equals 6 involving 11/12 cores anywhere from 5-80% of the cores. His prostate volume was 28 cc. Preoperative SHIM score was 24. AUA symptom score was 2 with a cord of life score of 2. Staging was negative for metastatic disease. He underwent MRI of the prostate in May 2014 which revealed a right lateral 1.4 cm lesion and carcinoma lead to be in the right base and lateral base of the prostate. There appeared to be loss of capsular integrity on the right lateral mid gland as well as loss of normal signal in the right neurovascular bundle. There was negative involvement of the seminal vesicles were the left neurovascular bundle, bone, or adenopathy. We reviewed the various management options along with the risks, benefits, alternatives, and likelihood of achieving goals. He presents today for robotic assisted radical laparoscopic prostatectomy, collateral pelvic lymph node dissection, right non-nerve sparing, and left nerve sparing to be determined at the time of surgery. UA of 01/26/13 was negative for signs of infection. He has been typed and screened in preparation for surgery.  PMH: Past Medical History  Diagnosis Date  . Arthritis     PSH: Past Surgical History  Procedure Laterality Date  . Prostate biopsy      Allergies: No Known Allergies  Medications: No prescriptions prior to admission     Social History: History   Social History  . Marital Status: Married    Spouse Name: N/A    Number of Children: N/A  . Years of Education: N/A   Occupational History  . Not on file.   Social History Main Topics  . Smoking status: Former Games developer  . Smokeless tobacco: Never Used  . Alcohol Use: Yes     Comment: occasionally  . Drug Use: No  .  Sexually Active: Not on file   Other Topics Concern  . Not on file   Social History Narrative  . No narrative on file    Family History: No family history on file.  Review of Systems: Positive: None. Negative: Fever, SOB, or chest pain.  A further 10 point review of systems was negative except what is listed in the HPI.  Physical Exam: Filed Vitals:   02/08/13 0652  Pulse: 104  Temp: 98.5 F (36.9 C)  Resp: 18    General: No acute distress.  Awake. Head:  Normocephalic.  Atraumatic. ENT:  EOMI.  Mucous membranes moist Neck:  Supple.  No lymphadenopathy. CV:  S1 present. S2 present. Regular rate. Pulmonary: Equal effort bilaterally.  Clear to auscultation bilaterally. Abdomen: Soft.  Non- tender to palpation. Skin:  Normal turgor.  No visible rash. Extremity: No gross deformity of bilateral upper extremities.  No gross deformity of    bilateral lower extremities. Neurologic: Alert. Appropriate mood.    Studies:  Recent Labs     02/06/13  0845  HGB  13.9  WBC  7.9  PLT  232    Recent Labs     02/06/13  0845  NA  133*  K  4.4  CL  95*  CO2  30  BUN  8  CREATININE  0.84  CALCIUM  9.3  GFRNONAA  >90  GFRAA  >90     No results found for this basename: PT, INR, APTT,  in the last 72 hours   No components found with this basename: ABG,     Assessment:  Prostate cancer  Plan: To OR for robotic assisted radical laparoscopic prostatectomy, collateral pelvic lymph node dissection, right non-nerve sparing, and left nerve sparing to be determined at the time of surgery.

## 2013-02-08 NOTE — Anesthesia Preprocedure Evaluation (Addendum)
Anesthesia Evaluation  Patient identified by MRN, date of birth, ID band Patient awake    Reviewed: Allergy & Precautions, H&P , NPO status , Patient's Chart, lab work & pertinent test results  Airway Mallampati: II TM Distance: >3 FB Neck ROM: Full    Dental  (+) Caps and Dental Advisory Given   Pulmonary neg pulmonary ROS,  breath sounds clear to auscultation  Pulmonary exam normal       Cardiovascular negative cardio ROS  Rhythm:Regular Rate:Normal     Neuro/Psych negative neurological ROS  negative psych ROS   GI/Hepatic negative GI ROS, Neg liver ROS,   Endo/Other  negative endocrine ROS  Renal/GU negative Renal ROS  negative genitourinary   Musculoskeletal negative musculoskeletal ROS (+)   Abdominal   Peds negative pediatric ROS (+)  Hematology negative hematology ROS (+)   Anesthesia Other Findings   Reproductive/Obstetrics negative OB ROS                          Anesthesia Physical Anesthesia Plan  ASA: I  Anesthesia Plan: General   Post-op Pain Management:    Induction: Intravenous  Airway Management Planned: Oral ETT  Additional Equipment:   Intra-op Plan:   Post-operative Plan: Extubation in OR  Informed Consent: I have reviewed the patients History and Physical, chart, labs and discussed the procedure including the risks, benefits and alternatives for the proposed anesthesia with the patient or authorized representative who has indicated his/her understanding and acceptance.   Dental advisory given  Plan Discussed with: CRNA and Surgeon  Anesthesia Plan Comments:         Anesthesia Quick Evaluation

## 2013-02-09 ENCOUNTER — Encounter (HOSPITAL_COMMUNITY): Payer: Self-pay | Admitting: Urology

## 2013-02-09 LAB — HEMOGLOBIN AND HEMATOCRIT, BLOOD: HCT: 34.3 % — ABNORMAL LOW (ref 39.0–52.0)

## 2013-02-09 LAB — BASIC METABOLIC PANEL
BUN: 7 mg/dL (ref 6–23)
Calcium: 8.1 mg/dL — ABNORMAL LOW (ref 8.4–10.5)
GFR calc non Af Amer: >90 mL/min (ref >90)
Glucose, Bld: 186 mg/dL — ABNORMAL HIGH (ref 70–99)
Sodium: 132 mEq/L — ABNORMAL LOW (ref 135–145)

## 2013-02-09 MED ORDER — OXYCODONE-ACETAMINOPHEN 5-325 MG/5ML PO SOLN: 5.0000 mL | ORAL | Status: DC | PRN

## 2013-02-09 MED ORDER — CIPROFLOXACIN HCL 500 MG PO TABS: 500.0000 mg | ORAL_TABLET | Freq: Two times a day (BID) | ORAL | Status: DC

## 2013-02-09 MED ORDER — HYOSCYAMINE SULFATE 0.125 MG SL SUBL: 0.1250 mg | SUBLINGUAL_TABLET | SUBLINGUAL | Status: DC | PRN

## 2013-02-09 MED ORDER — SENNOSIDES-DOCUSATE SODIUM 8.6-50 MG PO TABS: 1.0000 | ORAL_TABLET | Freq: Two times a day (BID) | ORAL | Status: DC

## 2013-02-09 MED ORDER — BISACODYL 10 MG RE SUPP: 10.0000 mg | Freq: Two times a day (BID) | RECTAL | Status: DC

## 2013-02-09 NOTE — Discharge Summary (Signed)
Physician Discharge Summary  Patient ID: Howard Valentine MRN: [id number] DOB/AGE: 1955-09-18 57 y.o.  Admit date: 02/08/2013 Discharge date: 02/09/2013  Admission Diagnoses: Prostate cancer  Discharge Diagnoses:  Prostate cancer  Discharged Condition: good  Hospital Course:  Patient admitted following robotic radical prostatectomy for prostate cancer. This was uncomplicated. He was admitted overnight for IV fluids and pain control. He was able to ambulate early. He had some initial nausea, but this resolved. He was able to tolerate PO pain medication. His urine output was excellent and his JP output was appropriately low. He met discharge criteria and was discharged home.  Consults: None  Significant Diagnostic Studies: None.  Treatments: surgery: Robotic radical prostatectomy.  Discharge Exam: Blood pressure 109/61, pulse 84, temperature 98 F (36.7 C), temperature source Oral, resp. rate 18, height 5\' 5"  (1.651 m), weight 63.05 kg (139 lb), SpO2 100.00%. Refer to PE from progress note on discharge date.  Disposition: 01-Home or Self Care  Discharge Orders   Future Orders Complete By Expires     Discharge patient  As directed         Medication List         bisacodyl 10 MG suppository  Commonly known as:  DULCOLAX  Place 1 suppository (10 mg total) rectally 2 (two) times daily.     ciprofloxacin 500 MG tablet  Commonly known as:  CIPRO  Take 1 tablet (500 mg total) by mouth 2 (two) times daily. Begin this antibiotic the day before you return to clinic.     hyoscyamine 0.125 MG SL tablet  Commonly known as:  LEVSIN SL  Take 1 tablet (0.125 mg total) by mouth every 4 (four) hours as needed (Bladder spasms.).     oxyCODONE-acetaminophen 5-325 MG/5ML solution  Commonly known as:  ROXICET  Take 5-10 mLs by mouth every 4 (four) hours as needed.     senna-docusate 8.6-50 MG per tablet  Commonly known as:  Senokot-S  Take 1 tablet by mouth 2 (two) times daily.            Follow-up Information   Follow up with Milford Cage, MD On 02/20/2013. (at 9:30)    Contact information:   [address] [place] [address] [phone]       Signed: Milford Cage 02/09/2013, 7:30 AM

## 2013-02-09 NOTE — Progress Notes (Signed)
Urology Progress Note  Subjective:     No acute urologic events overnight. Nausea early last night; none now. Negative flatus or BM. Tolerating regular diet. Ambulated last night and today. Pain well controlled; states it is more sore than pain.  ROS: Negative: chest pain or SOB.  Objective:  Patient Vitals for the past 24 hrs:  BP Temp Temp src Pulse Resp SpO2 Height Weight  02/09/13 0556 109/61 mmHg 98 F (36.7 C) Oral 84 18 100 % - -  02/09/13 0019 101/56 mmHg 98.2 F (36.8 C) Oral 92 14 95 % - -  02/08/13 2105 123/76 mmHg 97.9 F (36.6 C) Oral 95 16 100 % - -  02/08/13 1420 134/72 mmHg 97.5 F (36.4 C) - - 17 - 5\' 5"  (1.651 m) 63.05 kg (139 lb)  02/08/13 1419 134/72 mmHg 97.5 F (36.4 C) - 84 17 100 % - -  02/08/13 1400 135/70 mmHg 97.5 F (36.4 C) - 91 13 100 % - -  02/08/13 1345 134/74 mmHg - - 81 12 100 % - -  02/08/13 1330 134/89 mmHg - - 80 17 100 % - -  02/08/13 1315 139/80 mmHg - - 87 13 100 % - -  02/08/13 1307 140/75 mmHg 97.5 F (36.4 C) - 90 15 100 % - -    Physical Exam: General:  No acute distress, awake Cardiovascular:    [x]   S1/S2 present, RRR  []   Irregularly irregular Chest:  CTA-B Abdomen:               []  Soft, appropriately TTP  []  Soft, NTTP  [x]  Soft, appropriately TTP, incision(s) dressed, JP serosanguinous.  Genitourinary: Foley in place. Clear yellow urine.     I/O last 3 completed shifts: In: 5404.3 [P.O.:600; I.V.:3454.3; IV Piggyback:1350] Out: 3225 [Urine:2975; Drains:150; Blood:100]  Recent Labs     02/06/13  0845   02/08/13  1448  02/09/13  0451  HGB  13.9   < >  13.3  11.5*  WBC  7.9   --   13.1*   --   PLT  232   --   203   --    < > = values in this interval not displayed.    Recent Labs     02/06/13  0845  02/08/13  1448  02/09/13  0451  NA  133*   --   132*  K  4.4   --   3.7  CL  95*   --   96  CO2  30   --   28  BUN  8   --   7  CREATININE  0.84  0.74  0.82  CALCIUM  9.3   --   8.1*  GFRNONAA  >90   >90  >90  GFRAA  >90  >90  >90     No results found for this basename: PT, INR, APTT,  in the last 72 hours   No components found with this basename: ABG,     Length of stay: 1 days.  Assessment: Prostate cancer POD#1 Robotic radical prostatectomy   Plan: Discharge home.  D/c JP drain.  Patient and wife given discharge instructions.   Natalia Leatherwood, MD [phone]

## 2014-09-25 ENCOUNTER — Other Ambulatory Visit: Payer: Self-pay | Admitting: Family Medicine

## 2014-09-25 DIAGNOSIS — R7989 Other specified abnormal findings of blood chemistry: Secondary | ICD-10-CM

## 2014-09-25 DIAGNOSIS — R945 Abnormal results of liver function studies: Principal | ICD-10-CM

## 2014-10-01 ENCOUNTER — Ambulatory Visit
Admission: RE | Admit: 2014-10-01 | Discharge: 2014-10-01 | Disposition: A | Discharge status: Home / Self Care | Payer: BLUE CROSS/BLUE SHIELD | Source: Ambulatory Visit | Attending: Family Medicine | Admitting: Family Medicine

## 2014-10-01 DIAGNOSIS — R945 Abnormal results of liver function studies: Principal | ICD-10-CM

## 2014-10-01 DIAGNOSIS — R7989 Other specified abnormal findings of blood chemistry: Secondary | ICD-10-CM

## 2014-10-05 ENCOUNTER — Other Ambulatory Visit: Payer: Self-pay | Admitting: Family Medicine

## 2014-10-05 DIAGNOSIS — R932 Abnormal findings on diagnostic imaging of liver and biliary tract: Secondary | ICD-10-CM

## 2014-10-23 ENCOUNTER — Ambulatory Visit
Admission: RE | Admit: 2014-10-23 | Discharge: 2014-10-23 | Disposition: A | Discharge status: Home / Self Care | Payer: BLUE CROSS/BLUE SHIELD | Source: Ambulatory Visit | Attending: Family Medicine | Admitting: Family Medicine

## 2014-10-23 DIAGNOSIS — R932 Abnormal findings on diagnostic imaging of liver and biliary tract: Secondary | ICD-10-CM

## 2014-10-23 MED ORDER — GADOBENATE DIMEGLUMINE 529 MG/ML IV SOLN
14.0000 mL | Freq: Once | INTRAVENOUS | Status: AC | PRN
Start: 2014-10-23 — End: 2014-10-23
  Administered 2014-10-23: 14 mL via INTRAVENOUS

## 2015-01-29 NOTE — Progress Notes (Addendum)
GU Location of Tumor / Histology:    If Prostate Cancer, Gleason Score is (3 + 3) and PSA is (15.20) 01/2013.  PSA 0.04 on 12/28/14   Howard Valentine presented with an "elevated PSA of 15.2 on 10/18/12. He underwent prostate biopsy which revealed Gleason score 3+3 equals 6 involving 11/12 cores anywhere from 5-80% of the cores. His prostate volume was 28 cc. Preoperative SHIM score was 24. AUA symptom score was 2 with a cord of life score of 2. Staging was negative for metastatic disease. He underwent MRI of the prostate in May 2014 which revealed a right lateral 1.4 cm lesion and carcinoma lead to be in the right base and lateral base of the prostate. There appeared to be loss of capsular integrity on the right lateral mid gland as well as loss of normal signal in the right neurovascular bundle. There was negative involvement of the seminal vesicles were the left neurovascular bundle, bone, or adenopathy."  Past/Anticipated interventions by urology, if any: Biopsy of the Prostate and Prostatectomy 01/2013  Past/Anticipated interventions by medical oncology, if any: None  Weight changes, if any: NO  Bowel/Bladder complaints, if any: Urinary Incontinence, strong stream, , does not strain to void. Intermittent blood on pad, sometimes rusty, sometimes pink,  wears depends,  Regular bowel movments  Nausea/Vomiting, if any: NO  Pain issues, if any: NO  SAFETY ISSUES:  Prior radiation? No  Pacemaker/ICD? No  Possible current pregnancy? N/A  Is the patient on methotrexate? No  Current Complaints / other details:  Married, 1 son, smoker 25 years 1/2ppd quit 1999/11/06, drinks beers  3-6 cans 12 oz daily, sister dioed colon ca Nov 06, 2011 age 54, Brother  Prostate ca, surgery dx 2013/11/05, Paternal Uncle died brain tumor, paternal 1st cousin, prostate cancer living, mother dx Pagets  Disease, had  radiation, Radiation tx in [place] age 64, paternal grandfather Liver cancer died age 9 7:52 AM BP 119/79 mmHg  Pulse 94   Temp(Src) 98.5 F (36.9 C) (Oral)  Resp 20  Ht 5\' 5"  (1.651 m)  Wt 141 lb 6.4 oz (64.139 kg)  BMI 23.53 kg/m2  Wt Readings from Last 3 Encounters:  02/05/15 141 lb 6.4 oz (64.139 kg)  02/08/13 139 lb (63.05 kg)  02/06/13 139 lb (63.05 kg)

## 2015-02-05 ENCOUNTER — Encounter: Payer: Self-pay | Admitting: Radiation Oncology

## 2015-02-05 ENCOUNTER — Ambulatory Visit
Admission: RE | Admit: 2015-02-05 | Discharge: 2015-02-05 | Disposition: A | Discharge status: Home / Self Care | Payer: BLUE CROSS/BLUE SHIELD | Source: Ambulatory Visit | Attending: Radiation Oncology | Admitting: Radiation Oncology

## 2015-02-05 VITALS — BP 119/79 | HR 94 | Temp 98.5°F | Resp 20 | Ht 65.0 in | Wt 141.4 lb

## 2015-02-05 DIAGNOSIS — C61 Malignant neoplasm of prostate: Secondary | ICD-10-CM

## 2015-02-05 DIAGNOSIS — Z510 Encounter for antineoplastic radiation therapy: Secondary | ICD-10-CM | POA: Insufficient documentation

## 2015-02-05 DIAGNOSIS — Z8042 Family history of malignant neoplasm of prostate: Secondary | ICD-10-CM | POA: Insufficient documentation

## 2015-02-05 HISTORY — DX: Malignant neoplasm of prostate: C61

## 2015-02-05 NOTE — Progress Notes (Signed)
Please see the Nurse Progress Note in the MD Initial Consult Encounter for this patient. 

## 2015-02-05 NOTE — Progress Notes (Signed)
[facility] NEW PATIENT EVALUATION  Name: Howard Valentine MRN: [id number]  Date:   02/05/2015           DOB: Nov 23, 1955  Status: outpatient   CC: [name], MD  [name], MD    REFERRING PHYSICIAN: [name], MD   DIAGNOSIS: PSA recurrent carcinoma the prostate   HISTORY OF PRESENT ILLNESS:  Howard Valentine is a 59 y.o. male who is seen today through the courtesy of Dr. Burman Nieves for evaluation of his PSA recurrent carcinoma the prostate.  He presented with an elevated PSA of 15.2 in April 2014.  Ultrasound-guided biopsies were diagnostic for Gleason score 6 (3+3) involving 11 of 12 cores with 5-80% involvement of each core.  Staging was negative for metastatic disease.  Prostate MRI showed apparent loss of capsular integrity along the right lateral mid gland as well as loss of signal in the right neurovascular bundle worrisome for extracapsular extension.  There was no adenopathy.  On 02/08/2013 he underwent a non-nerve sparing robotic prostatectomy by Dr. Jasmine December.  He was found to have disease involving both lobes and surgical margins were negative with no evidence for extracapsular extension.  3 lymph nodes were free of metastatic disease.  His Gleason score 7 (3+4).  There was perineural invasion and also LV I identified.  His postoperative PSA became undetectable but it did rise to 0.03 on 09/24/2014 with a further rise to 0.04 on 12/28/2014.  Over the past one to 2 months he has had intermittent blood on his pad was slightly more incontinence day and night.  His incontinence is not every day.  No GI difficulties.  He does have erectile dysfunction.  PREVIOUS RADIATION THERAPY: No   PAST MEDICAL HISTORY:  has a past medical history of Arthritis; Cancer; and Prostate cancer (02/09/13).     PAST SURGICAL HISTORY:  Past Surgical History  Procedure Laterality Date  . Prostate biopsy    . Robot assisted laparoscopic radical  prostatectomy N/A 02/08/2013    Procedure: ROBOTIC ASSISTED LAPAROSCOPIC RADICAL PROSTATECTOMY LEVEL 2;  Surgeon: Molli Hazard, MD;  Location: [facility];  Service: Urology;  Laterality: N/A;  ROBOTIC PROSTATECTOMY, BILATERAL PELVIC LYMPH NODE DISSECTION, RIGHT NON NERVE SPARING   . Lymphadenectomy Bilateral 02/08/2013    Procedure: LYMPHADENECTOMY;  Surgeon: Molli Hazard, MD;  Location: [facility];  Service: Urology;  Laterality: Bilateral;     FAMILY HISTORY: His father is alive and well at 28 and his mother is alive and well at 37.  A brother was diagnosed with prostate cancer at 16.  A paternal cousin was diagnosed in his 71s or 32s.  SOCIAL HISTORY:  reports that he has quit smoking. He has never used smokeless tobacco. He reports that he drinks about 2.4 oz of alcohol per week. He reports that he does not use illicit drugs.  Remarried, one son from his first marriage, age 36.  He works as an Public librarian.   ALLERGIES: Review of patient's allergies indicates no known allergies.   MEDICATIONS:  Current Outpatient Prescriptions  Medication Sig Dispense Refill  . Garlic 10 MG CAPS Take 1 capsule by mouth daily.    . naproxen sodium (ANAPROX) 220 MG tablet Take 220 mg by mouth as needed.     No current facility-administered medications for this encounter.     REVIEW OF SYSTEMS:  Pertinent items are noted in HPI.    PHYSICAL EXAM:  height is 5\' 5"  (  1.651 m) and weight is 141 lb 6.4 oz (64.139 kg). His oral temperature is 98.5 F (36.9 C). His blood pressure is 119/79 and his pulse is 94. His respiration is 20.   Alert and oriented 59 year old white male appearing his stated age.  Head and neck examination: Grossly unremarkable.  Nodes: Without palpable cervical or supraclavicular lymphadenopathy.  Chest: Lungs clear.  Abdomen: Without masses organomegaly.  Genitalia: Unremarkable to inspection.  Rectal: The prostate bed is flat and is without masses or nodularity.   Extremities: Without edema.     LABORATORY DATA:  Lab Results  Component Value Date   WBC 13.1* 02/08/2013   HGB 11.5* 02/09/2013   HCT 34.3* 02/09/2013   MCV 99.3 02/08/2013   PLT 203 02/08/2013   Lab Results  Component Value Date   NA 132* 02/09/2013   K 3.7 02/09/2013   CL 96 02/09/2013   CO2 28 02/09/2013   No results found for: ALT, AST, GGT, ALKPHOS, BILITOT   PSA 0.04 from 12/28/2014   IMPRESSION:  PSA recurrent carcinoma the prostate.  I explained to the patient and his wife that there are clinical predictors for a local regional recurrence, and possible "salvage" with radiation therapy.  The best predictive the factor is a positive surgical margin which he does not have.  In his favor is a disease-free interval and initial Gleason score of 6/7.  Not in his favor is an initial PSA of over 10.  I explained to them that he presents with a mixed picture.  Considering his young age, I think it would be reasonable to offer him potentially curable radiation therapy but success is probably less than one third.  We discussed the potential acute and late toxicities of radiation therapy including the possibility that his urinary incontinence may worsen.  We also discussed treatment with a comfortably full bladder to minimize urinary toxicity.  I think it does make sense to have him undergo office cystoscopy prior to initiation of radiation therapy just to make sure there is not another urologic/bladder issue.  I defer to Dr. Louis Meckel.  Consent is signed today.  From a technical standpoint, I will consider treatment of his the proximal lymphatics as well as his prostate bed.  PLAN: As discussed above.  We will try to have him come in next week for his CT simulation and then start him in early August.  I spent 45  minutes face to face with the patient and more than 50% of that time was spent in counseling and/or coordination of care.

## 2015-02-07 ENCOUNTER — Encounter: Payer: Self-pay | Admitting: Radiation Oncology

## 2015-02-07 NOTE — Progress Notes (Signed)
Chart note: Dr. Burman Nieves me to tell me that he performed office cystoscopy and found changes within the bladder requiring cystoscopy under anesthesia for biopsies.  This may be done within the next 2-3 weeks.  We will postpone his radiation therapy.

## 2015-02-11 ENCOUNTER — Other Ambulatory Visit: Payer: Self-pay | Admitting: Urology

## 2015-02-11 ENCOUNTER — Ambulatory Visit: Payer: BLUE CROSS/BLUE SHIELD | Admitting: Radiation Oncology

## 2015-02-25 ENCOUNTER — Encounter (HOSPITAL_BASED_OUTPATIENT_CLINIC_OR_DEPARTMENT_OTHER): Payer: Self-pay | Admitting: *Deleted

## 2015-02-25 NOTE — Progress Notes (Signed)
NPO AFTER MN.  ARRIVE AT 0700.  NEEDS HG.  

## 2015-03-01 ENCOUNTER — Ambulatory Visit (HOSPITAL_BASED_OUTPATIENT_CLINIC_OR_DEPARTMENT_OTHER): Payer: BLUE CROSS/BLUE SHIELD | Admitting: Anesthesiology

## 2015-03-01 ENCOUNTER — Encounter (HOSPITAL_BASED_OUTPATIENT_CLINIC_OR_DEPARTMENT_OTHER): Payer: Self-pay

## 2015-03-01 ENCOUNTER — Ambulatory Visit (HOSPITAL_BASED_OUTPATIENT_CLINIC_OR_DEPARTMENT_OTHER)
Admission: RE | Admit: 2015-03-01 | Discharge: 2015-03-01 | Disposition: A | Discharge status: Home / Self Care | Payer: BLUE CROSS/BLUE SHIELD | Source: Ambulatory Visit | Attending: Urology | Admitting: Urology

## 2015-03-01 ENCOUNTER — Encounter (HOSPITAL_BASED_OUTPATIENT_CLINIC_OR_DEPARTMENT_OTHER)
Admission: RE | Disposition: A | Discharge status: Home / Self Care | Payer: Self-pay | Source: Ambulatory Visit | Attending: Urology

## 2015-03-01 DIAGNOSIS — C61 Malignant neoplasm of prostate: Secondary | ICD-10-CM | POA: Diagnosis not present

## 2015-03-01 DIAGNOSIS — R310 Gross hematuria: Secondary | ICD-10-CM

## 2015-03-01 DIAGNOSIS — C679 Malignant neoplasm of bladder, unspecified: Secondary | ICD-10-CM | POA: Diagnosis not present

## 2015-03-01 DIAGNOSIS — Z87891 Personal history of nicotine dependence: Secondary | ICD-10-CM | POA: Insufficient documentation

## 2015-03-01 HISTORY — DX: Hematuria, unspecified: R31.9

## 2015-03-01 HISTORY — DX: Unspecified symptoms and signs involving the genitourinary system: R39.9

## 2015-03-01 HISTORY — PX: CYSTOSCOPY WITH RETROGRADE PYELOGRAM, URETEROSCOPY AND STENT PLACEMENT: SHX5789

## 2015-03-01 LAB — POCT HEMOGLOBIN-HEMACUE: Hemoglobin: 14 g/dL (ref 13.0–17.0)

## 2015-03-01 SURGERY — CYSTOURETEROSCOPY, WITH RETROGRADE PYELOGRAM AND STENT INSERTION
Anesthesia: General | Laterality: Bilateral

## 2015-03-01 MED ORDER — FENTANYL CITRATE (PF) 100 MCG/2ML IJ SOLN
INTRAMUSCULAR | Status: DC | PRN
  Administered 2015-03-01: 25 ug via INTRAVENOUS
  Administered 2015-03-01: 50 ug via INTRAVENOUS
  Administered 2015-03-01: 25 ug via INTRAVENOUS

## 2015-03-01 MED ORDER — CIPROFLOXACIN IN D5W 400 MG/200ML IV SOLN
400.0000 mg | INTRAVENOUS | Status: AC
  Administered 2015-03-01: 400 mg via INTRAVENOUS
  Filled 2015-03-01: qty 200

## 2015-03-01 MED ORDER — MIDAZOLAM HCL 5 MG/5ML IJ SOLN
INTRAMUSCULAR | Status: DC | PRN
  Administered 2015-03-01: 2 mg via INTRAVENOUS

## 2015-03-01 MED ORDER — BELLADONNA ALKALOIDS-OPIUM 16.2-60 MG RE SUPP
RECTAL | Status: AC
  Filled 2015-03-01: qty 1

## 2015-03-01 MED ORDER — LIDOCAINE HCL 2 % EX GEL
CUTANEOUS | Status: DC | PRN
  Administered 2015-03-01: 1 via URETHRAL

## 2015-03-01 MED ORDER — SODIUM CHLORIDE 0.9 % IR SOLN
Status: DC | PRN
  Administered 2015-03-01: 3000 mL via INTRAVESICAL

## 2015-03-01 MED ORDER — DEXAMETHASONE SODIUM PHOSPHATE 4 MG/ML IJ SOLN
INTRAMUSCULAR | Status: DC | PRN
  Administered 2015-03-01: 8 mg via INTRAVENOUS

## 2015-03-01 MED ORDER — CIPROFLOXACIN IN D5W 400 MG/200ML IV SOLN
INTRAVENOUS | Status: AC
  Filled 2015-03-01: qty 200

## 2015-03-01 MED ORDER — PHENAZOPYRIDINE HCL 100 MG PO TABS
ORAL_TABLET | ORAL | Status: AC
  Filled 2015-03-01: qty 2

## 2015-03-01 MED ORDER — LIDOCAINE HCL (CARDIAC) 20 MG/ML IV SOLN
INTRAVENOUS | Status: DC | PRN
  Administered 2015-03-01: 60 mg via INTRAVENOUS

## 2015-03-01 MED ORDER — PHENAZOPYRIDINE HCL 200 MG PO TABS
200.0000 mg | ORAL_TABLET | Freq: Three times a day (TID) | ORAL | Status: DC
  Administered 2015-03-01: 200 mg via ORAL
  Filled 2015-03-01: qty 1

## 2015-03-01 MED ORDER — ONDANSETRON HCL 4 MG/2ML IJ SOLN
INTRAMUSCULAR | Status: DC | PRN
  Administered 2015-03-01: 4 mg via INTRAVENOUS

## 2015-03-01 MED ORDER — PROPOFOL 10 MG/ML IV BOLUS
INTRAVENOUS | Status: DC | PRN
  Administered 2015-03-01: 150 mg via INTRAVENOUS

## 2015-03-01 MED ORDER — FENTANYL CITRATE (PF) 100 MCG/2ML IJ SOLN
INTRAMUSCULAR | Status: AC
  Filled 2015-03-01: qty 6

## 2015-03-01 MED ORDER — FENTANYL CITRATE (PF) 100 MCG/2ML IJ SOLN
25.0000 ug | INTRAMUSCULAR | Status: DC | PRN
  Filled 2015-03-01: qty 1

## 2015-03-01 MED ORDER — BELLADONNA ALKALOIDS-OPIUM 16.2-60 MG RE SUPP
RECTAL | Status: DC | PRN
  Administered 2015-03-01: 1 via RECTAL

## 2015-03-01 MED ORDER — STERILE WATER FOR IRRIGATION IR SOLN
Status: DC | PRN
  Administered 2015-03-01: 3000 mL via INTRAVESICAL

## 2015-03-01 MED ORDER — MIDAZOLAM HCL 2 MG/2ML IJ SOLN
INTRAMUSCULAR | Status: AC
  Filled 2015-03-01: qty 2

## 2015-03-01 MED ORDER — PHENAZOPYRIDINE HCL 200 MG PO TABS: 200.0000 mg | ORAL_TABLET | Freq: Three times a day (TID) | ORAL | Status: DC | PRN

## 2015-03-01 MED ORDER — PROMETHAZINE HCL 25 MG/ML IJ SOLN
6.2500 mg | INTRAMUSCULAR | Status: DC | PRN
  Filled 2015-03-01: qty 1

## 2015-03-01 MED ORDER — LACTATED RINGERS IV SOLN
INTRAVENOUS | Status: DC
  Administered 2015-03-01: 08:00:00 via INTRAVENOUS
  Filled 2015-03-01: qty 1000

## 2015-03-01 SURGICAL SUPPLY — 28 items
BAG DRAIN URO-CYSTO SKYTR STRL (DRAIN) ×3 IMPLANT
BASKET LASER NITINOL 1.9FR (BASKET) IMPLANT
BASKET STNLS GEMINI 4WIRE 3FR (BASKET) IMPLANT
BASKET STONE 1.7 NGAGE (UROLOGICAL SUPPLIES) IMPLANT
BASKET ZERO TIP NITINOL 2.4FR (BASKET) IMPLANT
CANISTER SUCT LVC 12 LTR MEDI- (MISCELLANEOUS) ×3 IMPLANT
CATH URET 5FR 28IN OPEN ENDED (CATHETERS) ×3 IMPLANT
CATH URET DUAL LUMEN 6-10FR 50 (CATHETERS) IMPLANT
CLOTH BEACON ORANGE TIMEOUT ST (SAFETY) ×3 IMPLANT
FIBER LASER FLEXIVA 365 (UROLOGICAL SUPPLIES) IMPLANT
FIBER LASER TRAC TIP (UROLOGICAL SUPPLIES) IMPLANT
GLOVE BIO SURGEON STRL SZ7.5 (GLOVE) ×3 IMPLANT
GOWN STRL REUS W/ TWL XL LVL3 (GOWN DISPOSABLE) ×1 IMPLANT
GOWN STRL REUS W/TWL XL LVL3 (GOWN DISPOSABLE) ×2
GUIDEWIRE 0.038 PTFE COATED (WIRE) IMPLANT
GUIDEWIRE ANG ZIPWIRE 038X150 (WIRE) IMPLANT
GUIDEWIRE STR DUAL SENSOR (WIRE) ×3 IMPLANT
IV NS IRRIG 3000ML ARTHROMATIC (IV SOLUTION) IMPLANT
KIT BALLIN UROMAX 15FX10 (LABEL) IMPLANT
KIT BALLN UROMAX 15FX4 (MISCELLANEOUS) IMPLANT
KIT BALLN UROMAX 26 75X4 (MISCELLANEOUS)
MANIFOLD NEPTUNE II (INSTRUMENTS) IMPLANT
NS IRRIG 500ML POUR BTL (IV SOLUTION) ×3 IMPLANT
PACK CYSTO (CUSTOM PROCEDURE TRAY) ×3 IMPLANT
SET HIGH PRES BAL DIL (LABEL)
SHEATH ACCESS URETERAL 38CM (SHEATH) IMPLANT
TUBE FEEDING 8FR 16IN STR KANG (MISCELLANEOUS) IMPLANT
WATER STERILE IRR 3000ML UROMA (IV SOLUTION) ×6 IMPLANT

## 2015-03-01 NOTE — Discharge Instructions (Signed)
Transurethral Resection of Bladder Tumor (TURBT) or Bladder Biopsy ° ° °Definition: ° Transurethral Resection of the Bladder Tumor is a surgical procedure used to diagnose and remove tumors within the bladder. TURBT is the most common treatment for early stage bladder cancer. ° °General instructions: °   ° Your recent bladder surgery requires very little post hospital care but some definite precautions. ° °Despite the fact that no skin incisions were used, the area around the bladder incisions are raw and covered with scabs to promote healing and prevent bleeding. Certain precautions are needed to insure that the scabs are not disturbed over the next 2-4 weeks while the healing proceeds. ° °Because the raw surface inside your bladder and the irritating effects of urine you may expect frequency of urination and/or urgency (a stronger desire to urinate) and perhaps even getting up at night more often. This will usually resolve or improve slowly over the healing period. You may see some blood in your urine over the first 6 weeks. Do not be alarmed, even if the urine was clear for a while. Get off your feet and drink lots of fluids until clearing occurs. If you start to pass clots or don't improve call us. ° °Diet: ° °You may return to your normal diet immediately. Because of the raw surface of your bladder, alcohol, spicy foods, foods high in acid and drinks with caffeine may cause irritation or frequency and should be used in moderation. To keep your urine flowing freely and avoid constipation, drink plenty of fluids during the day (8-10 glasses). Tip: Avoid cranberry juice because it is very acidic. ° °Activity: ° °Your physical activity doesn't need to be restricted. However, if you are very active, you may see some blood in the urine. We suggest that you reduce your activity under the circumstances until the bleeding has stopped. ° °Bowels: ° °It is important to keep your bowels regular during the postoperative  period. Straining with bowel movements can cause bleeding. A bowel movement every other day is reasonable. Use a mild laxative if needed, such as milk of magnesia 2-3 tablespoons, or 2 Dulcolax tablets. Call if you continue to have problems. If you had been taking narcotics for pain, before, during or after your surgery, you may be constipated. Take a laxative if necessary. ° ° ° °Medication: ° °You should resume your pre-surgery medications unless told not to. In addition you may be given an antibiotic to prevent or treat infection. Antibiotics are not always necessary. All medication should be taken as prescribed until the bottles are finished unless you are having an unusual reaction to one of the drugs. ° ° ° ° ° °Post Anesthesia Home Care Instructions ° °Activity: °Get plenty of rest for the remainder of the day. A responsible adult should stay with you for 24 hours following the procedure.  °For the next 24 hours, DO NOT: °-Drive a car °-Operate machinery °-Drink alcoholic beverages °-Take any medication unless instructed by your physician °-Make any legal decisions or sign important papers. ° °Meals: °Start with liquid foods such as gelatin or soup. Progress to regular foods as tolerated. Avoid greasy, spicy, heavy foods. If nausea and/or vomiting occur, drink only clear liquids until the nausea and/or vomiting subsides. Call your physician if vomiting continues. ° °Special Instructions/Symptoms: °Your throat may feel dry or sore from the anesthesia or the breathing tube placed in your throat during surgery. If this causes discomfort, gargle with warm salt water. The discomfort should disappear within 24   hours. ° °If you had a scopolamine patch placed behind your ear for the management of post- operative nausea and/or vomiting: ° °1. The medication in the patch is effective for 72 hours, after which it should be removed.  Wrap patch in a tissue and discard in the trash. Wash hands thoroughly with soap and  water. °2. You may remove the patch earlier than 72 hours if you experience unpleasant side effects which may include dry mouth, dizziness or visual disturbances. °3. Avoid touching the patch. Wash your hands with soap and water after contact with the patch. °  ° °

## 2015-03-01 NOTE — Transfer of Care (Signed)
  Last Vitals:  Filed Vitals:   03/01/15 0705  BP: 134/70  Pulse: 89  Temp: 36.9 C  Resp: 14    Immediate Anesthesia Transfer of Care Note  Patient: Howard Valentine  Procedure(s) Performed: Procedure(s) (LRB): CYSTOSCOPY WITH BLADDER BIOPSY RETROGRADE BILATERAL  PYELOGRAM (Bilateral)  Patient Location: PACU  Anesthesia Type: General  Level of Consciousness: awake, alert  and oriented  Airway & Oxygen Therapy: Patient Spontanous Breathing and Patient connected to face mask oxygen  Post-op Assessment: Report given to PACU RN and Post -op Vital signs reviewed and stable  Post vital signs: Reviewed and stable  Complications: No apparent anesthesia complications

## 2015-03-01 NOTE — Anesthesia Procedure Notes (Signed)
Procedure Name: LMA Insertion Date/Time: 03/01/2015 8:40 AM Performed by: Denna Haggard D Pre-anesthesia Checklist: Patient identified, Emergency Drugs available, Suction available and Patient being monitored Patient Re-evaluated:Patient Re-evaluated prior to inductionOxygen Delivery Method: Circle System Utilized Preoxygenation: Pre-oxygenation with 100% oxygen Intubation Type: IV induction Ventilation: Mask ventilation without difficulty LMA: LMA inserted LMA Size: 4.0 Number of attempts: 1 Airway Equipment and Method: Bite block Placement Confirmation: positive ETCO2 Tube secured with: Tape Dental Injury: Teeth and Oropharynx as per pre-operative assessment  Comments: Very loose upper front teeth

## 2015-03-01 NOTE — Anesthesia Preprocedure Evaluation (Addendum)
Anesthesia Evaluation  Patient identified by MRN, date of birth, ID band Patient awake    Reviewed: Allergy & Precautions, NPO status , Patient's Chart, lab work & pertinent test results  Airway Mallampati: II  TM Distance: >3 FB Neck ROM: Full    Dental  (+) Poor Dentition,    Pulmonary former smoker,  breath sounds clear to auscultation  Pulmonary exam normal       Cardiovascular negative cardio ROS Normal cardiovascular examRhythm:Regular Rate:Normal     Neuro/Psych negative neurological ROS  negative psych ROS   GI/Hepatic negative GI ROS, Neg liver ROS,   Endo/Other  negative endocrine ROS  Renal/GU negative Renal ROS  negative genitourinary   Musculoskeletal negative musculoskeletal ROS (+)   Abdominal   Peds negative pediatric ROS (+)  Hematology negative hematology ROS (+)   Anesthesia Other Findings   Reproductive/Obstetrics negative OB ROS                           Anesthesia Physical Anesthesia Plan  ASA: II  Anesthesia Plan: General   Post-op Pain Management:    Induction: Intravenous  Airway Management Planned: LMA  Additional Equipment:   Intra-op Plan:   Post-operative Plan: Extubation in OR  Informed Consent: I have reviewed the patients History and Physical, chart, labs and discussed the procedure including the risks, benefits and alternatives for the proposed anesthesia with the patient or authorized representative who has indicated his/her understanding and acceptance.   Dental advisory given  Plan Discussed with: CRNA  Anesthesia Plan Comments:         Anesthesia Quick Evaluation

## 2015-03-01 NOTE — Anesthesia Postprocedure Evaluation (Signed)
  Anesthesia Post-op Note  Patient: Howard Valentine  Procedure(s) Performed: Procedure(s) (LRB): CYSTOSCOPY WITH BLADDER BIOPSY RETROGRADE BILATERAL  PYELOGRAM (Bilateral)  Patient Location: PACU  Anesthesia Type: General  Level of Consciousness: awake and alert   Airway and Oxygen Therapy: Patient Spontanous Breathing  Post-op Pain: mild  Post-op Assessment: Post-op Vital signs reviewed, Patient's Cardiovascular Status Stable, Respiratory Function Stable, Patent Airway and No signs of Nausea or vomiting  Last Vitals:  Filed Vitals:   03/01/15 1045  BP: 129/70  Pulse: 66  Temp: 36.4 C  Resp: 14    Post-op Vital Signs: stable   Complications: No apparent anesthesia complications

## 2015-03-01 NOTE — Op Note (Signed)
Preoperative diagnosis:  1. Gross hematuria 2. Recurrent prostate cancer   Postoperative diagnosis:  1. same   Procedure: 1. Cystoscopy with bilateral retrograde pyelogram and interpretations. 2. Bladder biopsy x4 with fulguration  Surgeon: [name], MD  Anesthesia: General  Complications: None  Intraoperative findings: The bladder findings were diffuse, involving the entire bladder. There was one discrete tumor in the back wall which was fibrotic and broad-based. Entire urothelial lining was thickened and edematous with streaks of velvety red mucosal lesions.  EBL: Minimal  Specimens: #1 back wall bladder lesion, #2 left lateral wall biopsy, #3 right lateral wall biopsy, #4 trigonal mucosal biopsy   Indication: Howard Valentine is a 59 y.o. patient with history of prostate cancer with subsequent biochemical recurrence.the patient also recently developed gross hematuria. As part of a workup for his hematuria he was noted to have unusual appearance of his bladder necessitating exam under anesthesia and bladder biopsies.  After reviewing the management options for treatment, he elected to proceed with the above surgical procedure(s). We have discussed the potential benefits and risks of the procedure, side effects of the proposed treatment, the likelihood of the patient achieving the goals of the procedure, and any potential problems that might occur during the procedure or recuperation. Informed consent has been obtained.  Description of procedure:  The patient was taken to the operating room and general anesthesia was induced.  The patient was placed in the dorsal lithotomy position, prepped and draped in the usual sterile fashion, and preoperative antibiotics were administered. A preoperative time-out was performed.   21 French 30 cystoscope was injected passed to the patient's urethra and into the bladder. Prostate was surgically absent. A 360 cystoscopic evaluation was  then performed with the above findings. The ureteral orifices were noted to be orthotopic. A 5 French open-ended ureteral catheter was then used to perform retrograde pyelograms bilaterally. Using 10 mL of Omnipaque contrast the distal ureter was injected and an retrograde pyelograms were performed. The ureters were noted to have normal caliber throughout the ureter with no evidence of filling defects or hydronephrosis in the renal pelvis. There may be some narrowing of the distal ureters bilaterally. This was hard to ascertain, but there was no significant hydronephrosis or transition point. Yet, the ureters did appear to be narrowed at the distal aspect.  Then using a cold cup biopsy forcep, I took multiple biopsies from the posterior bladder wall lesion. I then performed random biopsies on the left lateral wall, right lateral wall, and trigonal region. Then using the Bugbee cautery, I fulgurated all the biopsy sites. Hemostasis was noted to be excellent.  An exam under anesthesia was then performed noting a mobile bladder without evidence of any tumors or masses.  A B and O suppository was then placed in the patient's rectum and lidocaine jelly injected into patient's urethra. He was subsequently awoken and returned to PACU in excellent condition.  [name], M.D.

## 2015-03-01 NOTE — H&P (Signed)
Reason For Visit hematuria   History of Present Illness 59 year old male seen for eval of hematuria.    #1 Prostate cancer:  RALP, BPLND 02/08/13: 3+4=7, negative margins, pT2c N0, positive perineural and lymphovascular.  PSA:   0.04 on 12/28/14  0.03 on 09/24/14  <0.01 on 12/07/13.  <0.01 on 03/16/14  >0.01 on 06/18/14    #2 impotence. non-nerve sparing procedure - has chosen no treatment up to now.     #3 urinary incontinence. Post-prostatectomy, He uses one shield every 2 days. Improved with physical therapy exercises. Worse over the last several months.    Interval: Patient has noted to have biochemical recurrent prostate cancer. He was referred to Dr. Valere Dross for salvage EBRT. Patient has had intermittent hematuria over the last several months. Presents today for more thorough eval prior to initiation of radiation therapy. The patient states that he is had intermittent hematuria, this seems to drip after his voids into his underwear. There is minimal. He also has the feeling of incomplete bladder emptying. This is intermittent as well. Denies any dysuria. He has had some worsening leakage. The patient has no history of recurrent urinary tract infections.   Past Medical History Problems  1. History of No Medical Problems  Surgical History Problems  1. History of Laparoscopy With Bilateral Total Pelvic Lymphadenectomy 2. History of No Surgical Problems 3. History of Prostatect Retropubic Radical W/ Nerve Sparing Laparoscopic  Current Meds 1. No Reported Medications  Requested for: [id number] Recorded  Allergies Medication  1. No Known Drug Allergies  Family History Problems  1. Family history of Family Health Status - Father's Age : Father 2. Family history of Family Health Status - Mother's Age : Mother 3. Family history of Family Health Status Number Of Children : Father   1 son 4. Family history of Hypercholesterolemia : Father  Social History Problems   1. Alcohol Use   4 beers per day 2. Caffeine Use   weekends only 3. Exercise Habits   Does not currently participate in a scheduled exercise program. 4. Former smoker [phone])   smoked 1/2 ppd for 32 yrs & quit in 2002 5. Living Independently With Spouse 6. Marital History - Currently Married 7. Occupation:   Pharmacist, community Vital Signs [Data Includes: Last 1 Day]  Recorded: 21Jul2016 02:03PM  Blood Pressure: 132 / 83 Temperature: 98 F Heart Rate: 95  Physical Exam Constitutional: Well nourished and well developed . No acute distress.  ENT:. The ears and nose are normal in appearance.  Neck: The appearance of the neck is normal and no neck mass is present.  Abdomen: The abdomen is soft and nontender. No masses are palpated. No CVA tenderness. No hernias are palpable. No hepatosplenomegaly noted.  Genitourinary: Examination of the penis demonstrates no discharge, no masses, no lesions and a normal meatus. The scrotum is without lesions. The right epididymis is palpably normal and non-tender. The left epididymis is palpably normal and non-tender. The right testis is non-tender and without masses. The left testis is non-tender and without masses.  Lymphatics: The femoral and inguinal nodes are not enlarged or tender.  Skin: Normal skin turgor, no visible rash and no visible skin lesions.  Neuro/Psych:. Mood and affect are appropriate.    Results/Data Urine [Data Includes: Last 1 Day]   [id number]  COLOR RED   APPEARANCE CLOUDY   SPECIFIC GRAVITY <1.005   pH 5.5   GLUCOSE NEG mg/dL  BILIRUBIN NEG   KETONE NEG mg/dL  BLOOD  LARGE   PROTEIN NEG mg/dL  UROBILINOGEN 0.2 mg/dL  NITRITE NEG   LEUKOCYTE ESTERASE NEG   SQUAMOUS EPITHELIAL/HPF NONE SEEN   WBC 0-2 WBC/hpf  RBC TNTC RBC/hpf  BACTERIA RARE   CRYSTALS NONE SEEN   CASTS NONE SEEN    The patient's urinalysis today demonstrates microscopic hematuria, too numerous to count, without evidence of  infection.   Procedure  Procedure: Cystoscopy   Indication: Hematuria. Lower Urinary Tract Symptoms.  Informed Consent: Risks, benefits, and potential adverse events were discussed and informed consent was obtained from the patient.  Prep: The patient was prepped with betadine.  Anesthesia:. Local anesthesia was administered intraurethrally with 2% lidocaine jelly.  Antibiotic prophylaxis: Ciprofloxacin.  Procedure Note:  Urethral meatus:. No abnormalities.  Anterior urethra: No abnormalities.  Bladder: Visulization was clear. The ureteral orifices were in the normal anatomic position bilaterally. Examination of the bladder demonstrated erythematous mucosa and edema. There were small nodules scattered throughout the bladder, these were not typical papillary transitional cell nodules. There is also a velvet appearance over the vasculature on the floor of the bladder. A saline bladder washing was obtained and sent for cytologic analysis. Urine was sent for culture. The patient tolerated the procedure well.  Complications: None.    Assessment Assessed  1. Microscopic hematuria (R31.2) 2. Prostate cancer (C61)  The patient's hematuria is likely coming from whenever process is involved in the unusual appearance of his bladder. 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 It may well just be Malacaplakia. However either way the patient needs a biopsy.    Patient with intermediate risk prostate cancer status post robotic-assisted laparoscopic prostatectomy July 2014, and ED - he now appears to have biochemical recurrence. He is scheduled to undergo salvage radiation in the immediate future.   Plan Health Maintenance  1. UA With REFLEX; [Do Not Release]; Status:Complete;   Done: [id number] 01:48PM Male stress incontinence  2. Cysto; Status:Hold For - Appointment,Date of Service; Requested for:21Jul2016;  Microscopic hematuria  3. URINE CULTURE; Status:Hold For - Specimen/Data  Collection,Appointment; Requested  for:21Jul2016;  4. URINE CYTOLOGY; Status:Hold For - Specimen/Data Collection,Appointment;  Requested for:21Jul2016;  5. Follow-up Schedule Surgery Office  Follow-up  Status: Complete  Done: [id number]  Discussion/Summary I discussed the findings with the patient from our cystoscopy. I recommended that the patient consider bladder biopsy under anesthesia. At this point we would also perform retrograde pyelograms bilaterally. I discussed the surgery with the patient in detail. We went over the risks and benefits of it. I explained to the patient the potential risks of pain, progressive voiding symptoms, worsening incontinence, gross hematuria. We'll try to get this scheduled as soon as possible. Once this has been sorted out, we will touch base radiation oncology so they can initiate salvage radiation therapy for his biochemical recurrent prostate cancer .

## 2015-03-04 ENCOUNTER — Encounter (HOSPITAL_BASED_OUTPATIENT_CLINIC_OR_DEPARTMENT_OTHER): Payer: Self-pay | Admitting: Urology

## 2015-03-12 ENCOUNTER — Other Ambulatory Visit: Payer: Self-pay | Admitting: Urology

## 2015-03-12 ENCOUNTER — Ambulatory Visit (HOSPITAL_COMMUNITY)
Admission: RE | Admit: 2015-03-12 | Discharge: 2015-03-12 | Disposition: A | Discharge status: Home / Self Care | Payer: BLUE CROSS/BLUE SHIELD | Source: Ambulatory Visit | Attending: Urology | Admitting: Urology

## 2015-03-12 DIAGNOSIS — Z87891 Personal history of nicotine dependence: Secondary | ICD-10-CM | POA: Diagnosis not present

## 2015-03-12 DIAGNOSIS — Z8546 Personal history of malignant neoplasm of prostate: Secondary | ICD-10-CM | POA: Diagnosis not present

## 2015-03-12 DIAGNOSIS — C679 Malignant neoplasm of bladder, unspecified: Secondary | ICD-10-CM

## 2015-03-15 ENCOUNTER — Other Ambulatory Visit: Payer: Self-pay | Admitting: Urology

## 2015-03-26 ENCOUNTER — Encounter (HOSPITAL_COMMUNITY): Payer: Self-pay

## 2015-03-26 ENCOUNTER — Encounter (HOSPITAL_COMMUNITY)
Admission: RE | Admit: 2015-03-26 | Discharge: 2015-03-26 | Disposition: A | Discharge status: Home / Self Care | Payer: BLUE CROSS/BLUE SHIELD | Source: Ambulatory Visit | Attending: Urology | Admitting: Urology

## 2015-03-26 DIAGNOSIS — C679 Malignant neoplasm of bladder, unspecified: Secondary | ICD-10-CM | POA: Diagnosis not present

## 2015-03-26 DIAGNOSIS — Z01812 Encounter for preprocedural laboratory examination: Secondary | ICD-10-CM | POA: Diagnosis present

## 2015-03-26 HISTORY — DX: Frequency of micturition: R35.0

## 2015-03-26 HISTORY — DX: Nocturia: R35.1

## 2015-03-26 LAB — COMPREHENSIVE METABOLIC PANEL
ALBUMIN: 4.1 g/dL (ref 3.5–5.0)
ALT: 20 U/L (ref 17–63)
AST: 30 U/L (ref 15–41)
Alkaline Phosphatase: 50 U/L (ref 38–126)
Anion gap: 7 (ref 5–15)
BUN: 5 mg/dL — ABNORMAL LOW (ref 6–20)
CHLORIDE: 99 mmol/L — AB (ref 101–111)
CO2: 28 mmol/L (ref 22–32)
CREATININE: 0.86 mg/dL (ref 0.61–1.24)
Calcium: 9.3 mg/dL (ref 8.9–10.3)
GFR calc non Af Amer: >60 mL/min (ref >60)
GLUCOSE: 86 mg/dL (ref 65–99)
Potassium: 4.8 mmol/L (ref 3.5–5.1)
SODIUM: 134 mmol/L — AB (ref 135–145)
Total Bilirubin: 0.6 mg/dL (ref 0.3–1.2)
Total Protein: 7.9 g/dL (ref 6.5–8.1)

## 2015-03-26 LAB — CBC
HCT: 39.2 % (ref 39.0–52.0)
Hemoglobin: 13.5 g/dL (ref 13.0–17.0)
MCH: 34.1 pg — ABNORMAL HIGH (ref 26.0–34.0)
MCHC: 34.4 g/dL (ref 30.0–36.0)
MCV: 99 fL (ref 78.0–100.0)
PLATELETS: 255 10*3/uL (ref 150–400)
RBC: 3.96 MIL/uL — AB (ref 4.22–5.81)
RDW: 12.4 % (ref 11.5–15.5)
WBC: 10.5 10*3/uL (ref 4.0–10.5)

## 2015-03-26 NOTE — Patient Instructions (Signed)
Howard Valentine  03/26/2015   Your procedure is scheduled on: Monday April 01, 2015   Report to [facility] Main  Entrance take [place]  elevators to 3rd floor to  [place] at 5:30 AM.  Call this number if you have problems the morning of surgery [phone]   Remember: ONLY 1 PERSON MAY GO WITH YOU TO SHORT STAY TO GET  READY MORNING OF [place].  Do not eat food or drink liquids :After Midnight.     Take these medicines the morning of surgery with A SIP OF WATER:Loratadine (Claritin) if needed                                You may not have any metal on your body including hair pins and              piercings  Do not wear jewelry lotions, powders or colognes, deodorant                           Men may shave face and neck.   Do not bring valuables to the hospital. [place].  Contacts, dentures or bridgework may not be worn into surgery.  Leave suitcase in the car. After surgery it may be brought to your room.                Please read over the following fact sheets you were given:BLOOD TRANSFUSION INFORMATION SHEET _____________________________________________________________________             [facility] - Preparing for Surgery Before surgery, you can play an important role.  Because skin is not sterile, your skin needs to be as free of germs as possible.  You can reduce the number of germs on your skin by washing with CHG (chlorahexidine gluconate) soap before surgery.  CHG is an antiseptic cleaner which kills germs and bonds with the skin to continue killing germs even after washing. Please DO NOT use if you have an allergy to CHG or antibacterial soaps.  If your skin becomes reddened/irritated stop using the CHG and inform your nurse when you arrive at Short Stay. Do not shave (including legs and underarms) for at least 48 hours prior to the first CHG shower.  You may shave your  face/neck. Please follow these instructions carefully:  1.  Shower with CHG Soap the night before surgery and the  morning of Surgery.  2.  If you choose to wash your hair, wash your hair first as usual with your  normal  shampoo.  3.  After you shampoo, rinse your hair and body thoroughly to remove the  shampoo.                           4.  Use CHG as you would any other liquid soap.  You can apply chg directly  to the skin and wash                       Gently with a scrungie or clean washcloth.  5.  Apply the CHG Soap to your body ONLY FROM THE NECK DOWN.  Do not use on face/ open                           Wound or open sores. Avoid contact with eyes, ears mouth and genitals (private parts).                       Wash face,  Genitals (private parts) with your normal soap.             6.  Wash thoroughly, paying special attention to the area where your surgery  will be performed.  7.  Thoroughly rinse your body with warm water from the neck down.  8.  DO NOT shower/wash with your normal soap after using and rinsing off  the CHG Soap.                9.  Pat yourself dry with a clean towel.            10.  Wear clean pajamas.            11.  Place clean sheets on your bed the night of your first shower and do not  sleep with pets. Day of Surgery : Do not apply any lotions/deodorants the morning of surgery.  Please wear clean clothes to the hospital/surgery center.  FAILURE TO FOLLOW THESE INSTRUCTIONS MAY RESULT IN THE CANCELLATION OF YOUR SURGERY PATIENT SIGNATURE_________________________________  NURSE SIGNATURE__________________________________  ________________________________________________________________________  WHAT IS A BLOOD TRANSFUSION? Blood Transfusion Information  A transfusion is the replacement of blood or some of its parts. Blood is made up of multiple cells which provide different functions.  Red blood cells carry oxygen and are used for blood loss  replacement.  White blood cells fight against infection.  Platelets control bleeding.  Plasma helps clot blood.  Other blood products are available for specialized needs, such as hemophilia or other clotting disorders. BEFORE THE TRANSFUSION  Who gives blood for transfusions?   Healthy volunteers who are fully evaluated to make sure their blood is safe. This is blood bank blood. Transfusion therapy is the safest it has ever been in the practice of medicine. Before blood is taken from a donor, a complete history is taken to make sure that person has no history of diseases nor engages in risky social behavior (examples are intravenous drug use or sexual activity with multiple partners). The donor's travel history is screened to minimize risk of transmitting infections, such as malaria. The donated blood is tested for signs of infectious diseases, such as HIV and hepatitis. The blood is then tested to be sure it is compatible with you in order to minimize the chance of a transfusion reaction. If you or a relative donates blood, this is often done in anticipation of surgery and is not appropriate for emergency situations. It takes many days to process the donated blood. RISKS AND COMPLICATIONS Although transfusion therapy is very safe and saves many lives, the main dangers of transfusion include:   Getting an infectious disease.  Developing a transfusion reaction. This is an allergic reaction to something in the blood you were given. Every precaution is taken to prevent this. The decision to have a blood transfusion has been considered carefully by your caregiver before blood is given. Blood is not given unless the benefits outweigh the risks. AFTER THE TRANSFUSION  Right after receiving a blood transfusion, you will usually feel much better and more energetic. This is especially  true if your red blood cells have gotten low (anemic). The transfusion raises the level of the red blood cells which  carry oxygen, and this usually causes an energy increase.  The nurse administering the transfusion will monitor you carefully for complications. HOME CARE INSTRUCTIONS  No special instructions are needed after a transfusion. You may find your energy is better. Speak with your caregiver about any limitations on activity for underlying diseases you may have. SEEK MEDICAL CARE IF:   Your condition is not improving after your transfusion.  You develop redness or irritation at the intravenous (IV) site. SEEK IMMEDIATE MEDICAL CARE IF:  Any of the following symptoms occur over the next 12 hours:  Shaking chills.  You have a temperature by mouth above 102 F (38.9 C), not controlled by medicine.  Chest, back, or muscle pain.  People around you feel you are not acting correctly or are confused.  Shortness of breath or difficulty breathing.  Dizziness and fainting.  You get a rash or develop hives.  You have a decrease in urine output.  Your urine turns a dark color or changes to pink, red, or brown. Any of the following symptoms occur over the next 10 days:  You have a temperature by mouth above 102 F (38.9 C), not controlled by medicine.  Shortness of breath.  Weakness after normal activity.  The white part of the eye turns yellow (jaundice).  You have a decrease in the amount of urine or are urinating less often.  Your urine turns a dark color or changes to pink, red, or brown. Document Released: 07/03/2000 Document Revised: 09/28/2011 Document Reviewed: 02/20/2008 [facility] Patient Information 2014 [place], [place].  _______________________________________________________________________

## 2015-03-26 NOTE — Consult Note (Signed)
WOC ostomy consult note Patient seen today for preoperative stoma site selection and teaching session per Dr. Carlton Adam request.  Wife present with patient for session.  Abdomen assessed in the standing and sitting position.  Two sites are selected, both in the upper quadrants as patient wears his pants, belt and underwear low on his hips.   Right site is marked 5.5cm to the right of the umbilicus and in line with the umbilicus (which presents with a mild umbilical hernia) Left site is marked 6.5cm to the left of the umbilicus and 8.2PP above the umbilicus (which presents with a mild umbilical hernia) Sites are marked with a surgical skin marking pen and are covered with a thin film transparent dressing. Patient and wife are provided with information regarding stoma characteristics, pouch characteristics, GI and GU anatomy.  They are taught that there will be a specialty nurse (a [place]) working with the surgeon to select the post operative pouching system and to teach them to care for the stoma and pouching system. Patient and spouse are asking appropriate questions and express appreciation for conversation and teaching session. The only unresolved questions they have pertain to his ability to return to work and they seem pleased to know about the possibility of compression undergarments that can provide abdominal support. They are provided my contact information in the event they have questions prior to Monday. [place] nursing team will follow, and will remain available to this patient, the nursing, surgical and medical teams.  Please re-consult post operatively for stoma management. Thanks, Maudie Flakes, MSN, RN, [place], [place], [place] [phone])

## 2015-03-26 NOTE — Progress Notes (Signed)
CXR epic 03/12/2015

## 2015-03-27 NOTE — Progress Notes (Signed)
CMP results in epic per PAT visit 03/26/2015 sent to Dr Louis Meckel

## 2015-04-01 ENCOUNTER — Inpatient Hospital Stay (HOSPITAL_COMMUNITY)
Admission: RE | Admit: 2015-04-01 | Discharge: 2015-04-07 | DRG: 653 | Disposition: A | Discharge status: Home Health | Payer: BLUE CROSS/BLUE SHIELD | Source: Ambulatory Visit | Attending: Urology | Admitting: Urology

## 2015-04-01 ENCOUNTER — Inpatient Hospital Stay (HOSPITAL_COMMUNITY): Payer: BLUE CROSS/BLUE SHIELD | Admitting: Anesthesiology

## 2015-04-01 ENCOUNTER — Encounter (HOSPITAL_COMMUNITY)
Admission: RE | Disposition: A | Discharge status: Home Health | Payer: Self-pay | Source: Ambulatory Visit | Attending: Urology

## 2015-04-01 ENCOUNTER — Encounter (HOSPITAL_COMMUNITY): Payer: Self-pay | Admitting: *Deleted

## 2015-04-01 DIAGNOSIS — D696 Thrombocytopenia, unspecified: Secondary | ICD-10-CM | POA: Diagnosis present

## 2015-04-01 DIAGNOSIS — M1990 Unspecified osteoarthritis, unspecified site: Secondary | ICD-10-CM | POA: Diagnosis present

## 2015-04-01 DIAGNOSIS — E872 Acidosis: Secondary | ICD-10-CM | POA: Diagnosis present

## 2015-04-01 DIAGNOSIS — Z800 Family history of malignant neoplasm of digestive organs: Secondary | ICD-10-CM | POA: Diagnosis not present

## 2015-04-01 DIAGNOSIS — E875 Hyperkalemia: Secondary | ICD-10-CM | POA: Diagnosis present

## 2015-04-01 DIAGNOSIS — A419 Sepsis, unspecified organism: Secondary | ICD-10-CM | POA: Diagnosis not present

## 2015-04-01 DIAGNOSIS — D62 Acute posthemorrhagic anemia: Secondary | ICD-10-CM | POA: Diagnosis not present

## 2015-04-01 DIAGNOSIS — Z01812 Encounter for preprocedural laboratory examination: Secondary | ICD-10-CM

## 2015-04-01 DIAGNOSIS — Z87891 Personal history of nicotine dependence: Secondary | ICD-10-CM | POA: Diagnosis not present

## 2015-04-01 DIAGNOSIS — R579 Shock, unspecified: Secondary | ICD-10-CM | POA: Diagnosis not present

## 2015-04-01 DIAGNOSIS — E876 Hypokalemia: Secondary | ICD-10-CM | POA: Diagnosis not present

## 2015-04-01 DIAGNOSIS — E869 Volume depletion, unspecified: Secondary | ICD-10-CM | POA: Diagnosis present

## 2015-04-01 DIAGNOSIS — R351 Nocturia: Secondary | ICD-10-CM | POA: Diagnosis present

## 2015-04-01 DIAGNOSIS — D6489 Other specified anemias: Secondary | ICD-10-CM | POA: Diagnosis not present

## 2015-04-01 DIAGNOSIS — I9581 Postprocedural hypotension: Secondary | ICD-10-CM | POA: Diagnosis not present

## 2015-04-01 DIAGNOSIS — C679 Malignant neoplasm of bladder, unspecified: Secondary | ICD-10-CM | POA: Diagnosis present

## 2015-04-01 DIAGNOSIS — R739 Hyperglycemia, unspecified: Secondary | ICD-10-CM | POA: Diagnosis not present

## 2015-04-01 DIAGNOSIS — Z9079 Acquired absence of other genital organ(s): Secondary | ICD-10-CM | POA: Diagnosis present

## 2015-04-01 DIAGNOSIS — D649 Anemia, unspecified: Secondary | ICD-10-CM | POA: Diagnosis present

## 2015-04-01 DIAGNOSIS — Z8546 Personal history of malignant neoplasm of prostate: Secondary | ICD-10-CM

## 2015-04-01 DIAGNOSIS — G8918 Other acute postprocedural pain: Secondary | ICD-10-CM | POA: Diagnosis not present

## 2015-04-01 DIAGNOSIS — J9601 Acute respiratory failure with hypoxia: Secondary | ICD-10-CM | POA: Diagnosis not present

## 2015-04-01 DIAGNOSIS — E871 Hypo-osmolality and hyponatremia: Secondary | ICD-10-CM | POA: Diagnosis present

## 2015-04-01 DIAGNOSIS — R350 Frequency of micturition: Secondary | ICD-10-CM | POA: Diagnosis present

## 2015-04-01 HISTORY — PX: CYSTECTOMY: SHX5119

## 2015-04-01 LAB — BASIC METABOLIC PANEL WITH GFR
Anion gap: 10 (ref 5–15)
BUN: 8 mg/dL (ref 6–20)
CO2: 20 mmol/L — ABNORMAL LOW (ref 22–32)
Calcium: 7.1 mg/dL — ABNORMAL LOW (ref 8.9–10.3)
Chloride: 105 mmol/L (ref 101–111)
Creatinine, Ser: 1.13 mg/dL (ref 0.61–1.24)
GFR calc Af Amer: >60 mL/min
GFR calc non Af Amer: >60 mL/min
Glucose, Bld: 252 mg/dL — ABNORMAL HIGH (ref 65–99)
Potassium: 5.6 mmol/L — ABNORMAL HIGH (ref 3.5–5.1)
Sodium: 135 mmol/L (ref 135–145)

## 2015-04-01 LAB — BASIC METABOLIC PANEL
ANION GAP: 8 (ref 5–15)
BUN: 10 mg/dL (ref 6–20)
CALCIUM: 6.9 mg/dL — AB (ref 8.9–10.3)
CO2: 21 mmol/L — ABNORMAL LOW (ref 22–32)
Chloride: 106 mmol/L (ref 101–111)
Creatinine, Ser: 1.05 mg/dL (ref 0.61–1.24)
GFR calc Af Amer: >60 mL/min (ref >60)
GLUCOSE: 251 mg/dL — AB (ref 65–99)
Potassium: 4.7 mmol/L (ref 3.5–5.1)
SODIUM: 135 mmol/L (ref 135–145)

## 2015-04-01 LAB — CBC
HCT: 24.3 % — ABNORMAL LOW (ref 39.0–52.0)
HEMATOCRIT: 26 % — AB (ref 39.0–52.0)
HEMOGLOBIN: 8.8 g/dL — AB (ref 13.0–17.0)
Hemoglobin: 8.3 g/dL — ABNORMAL LOW (ref 13.0–17.0)
MCH: 31.7 pg (ref 26.0–34.0)
MCH: 31.9 pg (ref 26.0–34.0)
MCHC: 33.8 g/dL (ref 30.0–36.0)
MCHC: 34.2 g/dL (ref 30.0–36.0)
MCV: 93.5 fL (ref 78.0–100.0)
MCV: 93.5 fL (ref 78.0–100.0)
PLATELETS: 135 10*3/uL — AB (ref 150–400)
Platelets: 158 10*3/uL (ref 150–400)
RBC: 2.78 MIL/uL — ABNORMAL LOW (ref 4.22–5.81)
RBC: 2.6 MIL/uL — ABNORMAL LOW (ref 4.22–5.81)
RDW: 17.3 % — AB (ref 11.5–15.5)
RDW: 17.9 % — AB (ref 11.5–15.5)
WBC: 22.9 10*3/uL — AB (ref 4.0–10.5)
WBC: 23.4 10*3/uL — ABNORMAL HIGH (ref 4.0–10.5)

## 2015-04-01 LAB — POCT I-STAT 4, (NA,K, GLUC, HGB,HCT)
GLUCOSE: 223 mg/dL — AB (ref 65–99)
Glucose, Bld: 167 mg/dL — ABNORMAL HIGH (ref 65–99)
Glucose, Bld: 166 mg/dL — ABNORMAL HIGH (ref 65–99)
HCT: 25 % — ABNORMAL LOW (ref 39.0–52.0)
HCT: 24 % — ABNORMAL LOW (ref 39.0–52.0)
HEMATOCRIT: 18 % — AB (ref 39.0–52.0)
HEMOGLOBIN: 6.1 g/dL — AB (ref 13.0–17.0)
Hemoglobin: 8.5 g/dL — ABNORMAL LOW (ref 13.0–17.0)
Hemoglobin: 8.2 g/dL — ABNORMAL LOW (ref 13.0–17.0)
POTASSIUM: 4.9 mmol/L (ref 3.5–5.1)
Potassium: 4.2 mmol/L (ref 3.5–5.1)
Potassium: 4.6 mmol/L (ref 3.5–5.1)
SODIUM: 133 mmol/L — AB (ref 135–145)
SODIUM: 134 mmol/L — AB (ref 135–145)
Sodium: 134 mmol/L — ABNORMAL LOW (ref 135–145)

## 2015-04-01 LAB — GLUCOSE, CAPILLARY: Glucose-Capillary: 227 mg/dL — ABNORMAL HIGH (ref 65–99)

## 2015-04-01 LAB — MAGNESIUM: Magnesium: 1.3 mg/dL — ABNORMAL LOW (ref 1.7–2.4)

## 2015-04-01 LAB — PREPARE RBC (CROSSMATCH)

## 2015-04-01 LAB — PHOSPHORUS: PHOSPHORUS: 3.8 mg/dL (ref 2.5–4.6)

## 2015-04-01 LAB — LACTIC ACID, PLASMA
Lactic Acid, Venous: 3.4 mmol/L (ref 0.5–2.0)
Lactic Acid, Venous: 3.2 mmol/L (ref 0.5–2.0)

## 2015-04-01 LAB — PROCALCITONIN: Procalcitonin: 0.6 ng/mL

## 2015-04-01 LAB — TSH: TSH: 1.623 u[IU]/mL (ref 0.350–4.500)

## 2015-04-01 LAB — TROPONIN I: Troponin I: <0.03 ng/mL (ref <0.031)

## 2015-04-01 SURGERY — CYSTECTOMY, COMPLETE
Anesthesia: General

## 2015-04-01 MED ORDER — ROPIVACAINE HCL 2 MG/ML IJ SOLN
4.0000 mL/h | INTRAMUSCULAR | Status: DC
Start: 2015-04-01 — End: 2015-04-07
  Administered 2015-04-01: 8 mL/h via EPIDURAL
  Administered 2015-04-03: 4 mL/h via EPIDURAL
  Filled 2015-04-01 (×5): qty 200

## 2015-04-01 MED ORDER — PROPOFOL 10 MG/ML IV BOLUS
INTRAVENOUS | Status: DC | PRN
  Administered 2015-04-01: 100 mg via INTRAVENOUS

## 2015-04-01 MED ORDER — HYDROMORPHONE HCL 2 MG/ML IJ SOLN
INTRAMUSCULAR | Status: AC
  Filled 2015-04-01: qty 1

## 2015-04-01 MED ORDER — ALBUMIN HUMAN 5 % IV SOLN
INTRAVENOUS | Status: AC
  Filled 2015-04-01: qty 250

## 2015-04-01 MED ORDER — SUCCINYLCHOLINE CHLORIDE 20 MG/ML IJ SOLN
INTRAMUSCULAR | Status: DC | PRN
  Administered 2015-04-01: 100 mg via INTRAVENOUS

## 2015-04-01 MED ORDER — SODIUM CHLORIDE 0.9 % IV SOLN
INTRAVENOUS | Status: DC | PRN
  Administered 2015-04-01: 13:00:00 via INTRAVENOUS

## 2015-04-01 MED ORDER — ROCURONIUM BROMIDE 100 MG/10ML IV SOLN
INTRAVENOUS | Status: AC
  Filled 2015-04-01: qty 1

## 2015-04-01 MED ORDER — PHENYLEPHRINE HCL 10 MG/ML IJ SOLN: 30.0000 ug/min | INTRAVENOUS | Status: DC

## 2015-04-01 MED ORDER — SODIUM CHLORIDE 0.9 % IJ SOLN
INTRAMUSCULAR | Status: AC
  Filled 2015-04-01: qty 20

## 2015-04-01 MED ORDER — HYDROMORPHONE HCL 1 MG/ML IJ SOLN
INTRAMUSCULAR | Status: DC | PRN
  Administered 2015-04-01: .2 mg via INTRAVENOUS
  Administered 2015-04-01: .4 mg via INTRAVENOUS
  Administered 2015-04-01: .2 mg via INTRAVENOUS
  Administered 2015-04-01: .4 mg via INTRAVENOUS
  Administered 2015-04-01: .2 mg via INTRAVENOUS
  Administered 2015-04-01: .4 mg via INTRAVENOUS

## 2015-04-01 MED ORDER — BUPIVACAINE HCL (PF) 0.25 % IJ SOLN
INTRAMUSCULAR | Status: AC
  Filled 2015-04-01: qty 30

## 2015-04-01 MED ORDER — MIDAZOLAM HCL 5 MG/5ML IJ SOLN
INTRAMUSCULAR | Status: DC | PRN
  Administered 2015-04-01 (×2): 1 mg via INTRAVENOUS

## 2015-04-01 MED ORDER — SODIUM CHLORIDE 0.9 % IV SOLN: INTRAVENOUS | Status: DC | PRN

## 2015-04-01 MED ORDER — DEXTROSE 5 % IV SOLN
1.0000 g | INTRAVENOUS | Status: DC
  Filled 2015-04-01: qty 10

## 2015-04-01 MED ORDER — CALCIUM CARBONATE ANTACID 500 MG PO CHEW: 1.0000 | CHEWABLE_TABLET | Freq: Every day | ORAL | Status: DC | PRN

## 2015-04-01 MED ORDER — GLYCOPYRROLATE 0.2 MG/ML IJ SOLN
INTRAMUSCULAR | Status: DC | PRN
  Administered 2015-04-01: 0.6 mg via INTRAVENOUS

## 2015-04-01 MED ORDER — PHENYLEPHRINE HCL 10 MG/ML IJ SOLN
INTRAMUSCULAR | Status: AC
  Filled 2015-04-01: qty 1

## 2015-04-01 MED ORDER — ALVIMOPAN 12 MG PO CAPS
12.0000 mg | ORAL_CAPSULE | Freq: Two times a day (BID) | ORAL | Status: DC
  Administered 2015-04-02 – 2015-04-03 (×5): 12 mg via ORAL
  Filled 2015-04-01 (×6): qty 1

## 2015-04-01 MED ORDER — ALBUMIN HUMAN 5 % IV SOLN
INTRAVENOUS | Status: DC | PRN
  Administered 2015-04-01 (×2): via INTRAVENOUS

## 2015-04-01 MED ORDER — SODIUM CHLORIDE 0.9 % IV BOLUS (SEPSIS)
500.0000 mL | Freq: Once | INTRAVENOUS | Status: AC
  Administered 2015-04-01: 500 mL via INTRAVENOUS

## 2015-04-01 MED ORDER — LACTATED RINGERS IV SOLN
INTRAVENOUS | Status: DC | PRN
  Administered 2015-04-01 (×8): via INTRAVENOUS

## 2015-04-01 MED ORDER — DOPAMINE-DEXTROSE 3.2-5 MG/ML-% IV SOLN
INTRAVENOUS | Status: AC
  Filled 2015-04-01: qty 250

## 2015-04-01 MED ORDER — PHENYLEPHRINE HCL 10 MG/ML IJ SOLN
INTRAMUSCULAR | Status: DC | PRN
  Administered 2015-04-01: 40 ug via INTRAVENOUS
  Administered 2015-04-01: 80 ug via INTRAVENOUS
  Administered 2015-04-01: 40 ug via INTRAVENOUS
  Administered 2015-04-01 (×2): 80 ug via INTRAVENOUS
  Administered 2015-04-01 (×2): 40 ug via INTRAVENOUS

## 2015-04-01 MED ORDER — 0.9 % SODIUM CHLORIDE (POUR BTL) OPTIME
TOPICAL | Status: DC | PRN
  Administered 2015-04-01: 3000 mL

## 2015-04-01 MED ORDER — HYDROMORPHONE HCL 1 MG/ML IJ SOLN: 0.2500 mg | INTRAMUSCULAR | Status: DC | PRN

## 2015-04-01 MED ORDER — ONDANSETRON HCL 4 MG/2ML IJ SOLN
4.0000 mg | INTRAMUSCULAR | Status: DC | PRN
  Administered 2015-04-01 – 2015-04-07 (×6): 4 mg via INTRAVENOUS
  Filled 2015-04-01 (×5): qty 2

## 2015-04-01 MED ORDER — ONDANSETRON HCL 4 MG/2ML IJ SOLN
INTRAMUSCULAR | Status: AC
  Filled 2015-04-01: qty 2

## 2015-04-01 MED ORDER — CHLORHEXIDINE GLUCONATE 0.12 % MT SOLN
15.0000 mL | Freq: Two times a day (BID) | OROMUCOSAL | Status: DC
  Administered 2015-04-01 – 2015-04-06 (×9): 15 mL via OROMUCOSAL
  Filled 2015-04-01 (×7): qty 15

## 2015-04-01 MED ORDER — MIDAZOLAM HCL 2 MG/2ML IJ SOLN
INTRAMUSCULAR | Status: AC
  Filled 2015-04-01: qty 4

## 2015-04-01 MED ORDER — DOPAMINE-DEXTROSE 3.2-5 MG/ML-% IV SOLN
2.0000 ug/kg/min | INTRAVENOUS | Status: DC
  Administered 2015-04-01: 4.246 ug/kg/min via INTRAVENOUS

## 2015-04-01 MED ORDER — CETYLPYRIDINIUM CHLORIDE 0.05 % MT LIQD
7.0000 mL | Freq: Two times a day (BID) | OROMUCOSAL | Status: DC
  Administered 2015-04-02 – 2015-04-06 (×8): 7 mL via OROMUCOSAL

## 2015-04-01 MED ORDER — DEXTROSE 5 % IV SOLN
1.0000 g | Freq: Once | INTRAVENOUS | Status: AC
  Administered 2015-04-01: 1 g via INTRAVENOUS
  Filled 2015-04-01: qty 10

## 2015-04-01 MED ORDER — SODIUM CHLORIDE 0.9 % IV SOLN
INTRAVENOUS | Status: DC
  Administered 2015-04-01 – 2015-04-02 (×2): via INTRAVENOUS

## 2015-04-01 MED ORDER — LACTATED RINGERS IV SOLN: INTRAVENOUS | Status: DC

## 2015-04-01 MED ORDER — LACTATED RINGERS IV SOLN
INTRAVENOUS | Status: DC
  Administered 2015-04-01: 21:00:00 via INTRAVENOUS

## 2015-04-01 MED ORDER — DEXTROSE 5 % IV SOLN: 1.0000 g | INTRAVENOUS | Status: DC

## 2015-04-01 MED ORDER — PROPOFOL 10 MG/ML IV BOLUS
INTRAVENOUS | Status: AC
  Filled 2015-04-01: qty 20

## 2015-04-01 MED ORDER — DEXAMETHASONE SODIUM PHOSPHATE 10 MG/ML IJ SOLN
INTRAMUSCULAR | Status: DC | PRN
  Administered 2015-04-01: 10 mg via INTRAVENOUS

## 2015-04-01 MED ORDER — DEXTROSE 5 % IV SOLN
2.0000 g | INTRAVENOUS | Status: AC
  Administered 2015-04-02: 2 g via INTRAVENOUS
  Filled 2015-04-01: qty 2

## 2015-04-01 MED ORDER — FENTANYL CITRATE (PF) 250 MCG/5ML IJ SOLN
INTRAMUSCULAR | Status: DC | PRN
  Administered 2015-04-01 (×2): 25 ug via INTRAVENOUS
  Administered 2015-04-01: 50 ug via INTRAVENOUS
  Administered 2015-04-01 (×4): 25 ug via INTRAVENOUS
  Administered 2015-04-01: 50 ug via INTRAVENOUS

## 2015-04-01 MED ORDER — GLYCOPYRROLATE 0.2 MG/ML IJ SOLN
INTRAMUSCULAR | Status: AC
  Filled 2015-04-01: qty 3

## 2015-04-01 MED ORDER — INSULIN ASPART 100 UNIT/ML ~~LOC~~ SOLN
0.0000 [IU] | SUBCUTANEOUS | Status: DC
  Administered 2015-04-02: 2 [IU] via SUBCUTANEOUS
  Administered 2015-04-02: 3 [IU] via SUBCUTANEOUS
  Administered 2015-04-03 (×2): 1 [IU] via SUBCUTANEOUS

## 2015-04-01 MED ORDER — PROMETHAZINE HCL 25 MG/ML IJ SOLN: 6.2500 mg | INTRAMUSCULAR | Status: DC | PRN

## 2015-04-01 MED ORDER — PHENYLEPHRINE 40 MCG/ML (10ML) SYRINGE FOR IV PUSH (FOR BLOOD PRESSURE SUPPORT)
PREFILLED_SYRINGE | INTRAVENOUS | Status: AC
  Filled 2015-04-01: qty 10

## 2015-04-01 MED ORDER — ROCURONIUM BROMIDE 100 MG/10ML IV SOLN
INTRAVENOUS | Status: DC | PRN
  Administered 2015-04-01 (×2): 10 mg via INTRAVENOUS
  Administered 2015-04-01: 30 mg via INTRAVENOUS
  Administered 2015-04-01 (×2): 20 mg via INTRAVENOUS
  Administered 2015-04-01: 5 mg via INTRAVENOUS
  Administered 2015-04-01: 10 mg via INTRAVENOUS
  Administered 2015-04-01 (×3): 20 mg via INTRAVENOUS
  Administered 2015-04-01: 10 mg via INTRAVENOUS
  Administered 2015-04-01: 20 mg via INTRAVENOUS

## 2015-04-01 MED ORDER — BUPIVACAINE LIPOSOME 1.3 % IJ SUSP
20.0000 mL | Freq: Once | INTRAMUSCULAR | Status: DC
  Filled 2015-04-01: qty 20

## 2015-04-01 MED ORDER — SODIUM CHLORIDE 0.9 % IV SOLN
INTRAVENOUS | Status: DC
  Administered 2015-04-01: 20:00:00 via INTRAVENOUS

## 2015-04-01 MED ORDER — NEOSTIGMINE METHYLSULFATE 10 MG/10ML IV SOLN
INTRAVENOUS | Status: DC | PRN
  Administered 2015-04-01: 4 mg via INTRAVENOUS

## 2015-04-01 MED ORDER — MEPERIDINE HCL 50 MG/ML IJ SOLN: 6.2500 mg | INTRAMUSCULAR | Status: DC | PRN

## 2015-04-01 MED ORDER — DEXAMETHASONE SODIUM PHOSPHATE 10 MG/ML IJ SOLN
INTRAMUSCULAR | Status: AC
  Filled 2015-04-01: qty 1

## 2015-04-01 MED ORDER — LIDOCAINE-EPINEPHRINE 2 %-1:100000 IJ SOLN
INTRAMUSCULAR | Status: DC | PRN
  Administered 2015-04-01: 3 mL via INTRADERMAL
  Administered 2015-04-01: 2 mL via INTRADERMAL

## 2015-04-01 MED ORDER — PHENYLEPHRINE HCL 10 MG/ML IJ SOLN
10.0000 mg | INTRAVENOUS | Status: DC | PRN
  Administered 2015-04-01: 18:00:00 via INTRAVENOUS
  Administered 2015-04-01: 5 ug/min via INTRAVENOUS

## 2015-04-01 MED ORDER — ALVIMOPAN 12 MG PO CAPS
12.0000 mg | ORAL_CAPSULE | Freq: Once | ORAL | Status: AC
  Administered 2015-04-01: 12 mg via ORAL
  Filled 2015-04-01: qty 1

## 2015-04-01 MED ORDER — BUPIVACAINE HCL (PF) 0.25 % IJ SOLN
INTRAMUSCULAR | Status: DC | PRN
  Administered 2015-04-01: 3 mL via EPIDURAL
  Administered 2015-04-01: 4 mL via EPIDURAL
  Administered 2015-04-01: 3 mL via EPIDURAL

## 2015-04-01 MED ORDER — FENTANYL CITRATE (PF) 250 MCG/5ML IJ SOLN
INTRAMUSCULAR | Status: AC
  Filled 2015-04-01: qty 25

## 2015-04-01 MED ORDER — PHENYLEPHRINE HCL 10 MG/ML IJ SOLN
30.0000 ug/min | INTRAMUSCULAR | Status: DC
  Administered 2015-04-01: 60 ug/min via INTRAVENOUS
  Administered 2015-04-02: 100 ug/min via INTRAVENOUS
  Administered 2015-04-02 (×2): 80 ug/min via INTRAVENOUS
  Administered 2015-04-02: 60 ug/min via INTRAVENOUS
  Filled 2015-04-01 (×7): qty 1

## 2015-04-01 MED ORDER — PANTOPRAZOLE SODIUM 40 MG IV SOLR
40.0000 mg | Freq: Every day | INTRAVENOUS | Status: DC
  Administered 2015-04-01 – 2015-04-03 (×3): 40 mg via INTRAVENOUS
  Filled 2015-04-01 (×3): qty 40

## 2015-04-01 MED ORDER — ONDANSETRON HCL 4 MG/2ML IJ SOLN
INTRAMUSCULAR | Status: DC | PRN
  Administered 2015-04-01: 4 mg via INTRAVENOUS

## 2015-04-01 SURGICAL SUPPLY — 78 items
BLADE EXTENDED COATED 6.5IN (ELECTRODE) ×3 IMPLANT
BLADE HEX COATED 2.75 (ELECTRODE) IMPLANT
CATH FOLEY 2WAY SLVR 30CC 24FR (CATHETERS) ×3 IMPLANT
CATH SIMPLASTIC 24 30ML (CATHETERS) IMPLANT
CHLORAPREP W/TINT 26ML (MISCELLANEOUS) ×3 IMPLANT
CLIP LIGATING HEM O LOK PURPLE (MISCELLANEOUS) ×6 IMPLANT
CLIP LIGATING HEMO O LOK GREEN (MISCELLANEOUS) ×6 IMPLANT
COVER MAYO STAND STRL (DRAPES) ×3 IMPLANT
COVER SURGICAL LIGHT HANDLE (MISCELLANEOUS) ×3 IMPLANT
DISSECTOR ROUND CHERRY 3/8 STR (MISCELLANEOUS) ×3 IMPLANT
DRAIN CHANNEL RND F F (WOUND CARE) ×3 IMPLANT
DRAPE LAPAROSCOPIC ABDOMINAL (DRAPES) IMPLANT
DRAPE LG THREE QUARTER DISP (DRAPES) ×3 IMPLANT
DRAPE TABLE BACK 44X90 PK DISP (DRAPES) IMPLANT
DRAPE UTILITY XL STRL (DRAPES) ×3 IMPLANT
DRAPE WARM FLUID 44X44 (DRAPE) ×3 IMPLANT
DRSG TEGADERM 8X12 (GAUZE/BANDAGES/DRESSINGS) ×3 IMPLANT
DRSG TELFA 4X10 ISLAND STR (GAUZE/BANDAGES/DRESSINGS) ×3 IMPLANT
ELECT REM PT RETURN 9FT ADLT (ELECTROSURGICAL) ×3
ELECTRODE REM PT RTRN 9FT ADLT (ELECTROSURGICAL) ×1 IMPLANT
EVACUATOR SILICONE 100CC (DRAIN) ×3 IMPLANT
GAUZE SPONGE 4X4 12PLY STRL (GAUZE/BANDAGES/DRESSINGS) IMPLANT
GAUZE SPONGE 4X4 16PLY XRAY LF (GAUZE/BANDAGES/DRESSINGS) ×3 IMPLANT
GLOVE BIO SURGEON STRL SZ 6.5 (GLOVE) ×6 IMPLANT
GLOVE BIO SURGEON STRL SZ8 (GLOVE) ×3 IMPLANT
GLOVE BIO SURGEONS STRL SZ 6.5 (GLOVE) ×3
GLOVE BIOGEL M STRL SZ7.5 (GLOVE) ×12 IMPLANT
GLOVE BIOGEL PI IND STRL 6.5 (GLOVE) ×2 IMPLANT
GLOVE BIOGEL PI INDICATOR 6.5 (GLOVE) ×4
GLOVE INDICATOR 8.0 STRL GRN (GLOVE) ×9 IMPLANT
GOWN STRL REUS W/ TWL XL LVL3 (GOWN DISPOSABLE) ×3 IMPLANT
GOWN STRL REUS W/TWL LRG LVL3 (GOWN DISPOSABLE) ×12 IMPLANT
GOWN STRL REUS W/TWL XL LVL3 (GOWN DISPOSABLE) ×6
HEMOSTAT SURGICEL 2X14 (HEMOSTASIS) ×3 IMPLANT
KIT BASIN OR (CUSTOM PROCEDURE TRAY) ×3 IMPLANT
LIGASURE IMPACT 36 18CM CVD LR (INSTRUMENTS) ×3 IMPLANT
LOOP VESSEL MAXI BLUE (MISCELLANEOUS) ×3 IMPLANT
LUBRICANT JELLY K Y 4OZ (MISCELLANEOUS) ×3 IMPLANT
NS IRRIG 1000ML POUR BTL (IV SOLUTION) ×6 IMPLANT
PACK GENERAL/GYN (CUSTOM PROCEDURE TRAY) ×3 IMPLANT
PLUG CATH AND CAP STER (CATHETERS) ×3 IMPLANT
RELOAD LINEAR CUT PROX 55 BLUE (ENDOMECHANICALS) ×6 IMPLANT
RELOAD PROXIMATE 75MM BLUE (ENDOMECHANICALS) IMPLANT
SHEET LAVH (DRAPES) ×3 IMPLANT
SPONGE LAP 18X18 X RAY DECT (DISPOSABLE) ×12 IMPLANT
STAPLER GUN LINEAR PROX 60 (STAPLE) ×3 IMPLANT
STAPLER PROXIMATE 55 BLUE (STAPLE) ×3 IMPLANT
STAPLER PROXIMATE 75MM BLUE (STAPLE) IMPLANT
STAPLER VISISTAT 35W (STAPLE) IMPLANT
STENT SET URETHERAL LEFT 7FR (STENTS) ×3 IMPLANT
STENT SET URETHERAL RIGHT 7FR (STENTS) ×3 IMPLANT
SUCTION FRAZIER TIP 10 FR DISP (SUCTIONS) IMPLANT
SUT CHROMIC 3 0 SH 27 (SUTURE) ×36 IMPLANT
SUT ETHILON 3 0 PS 1 (SUTURE) ×3 IMPLANT
SUT MON AB 3-0 SH 27 (SUTURE)
SUT MON AB 3-0 SH27 (SUTURE) IMPLANT
SUT PDS AB 1 CTX 36 (SUTURE) IMPLANT
SUT PDS AB 1 TP1 96 (SUTURE) ×6 IMPLANT
SUT SILK 0 (SUTURE) ×2
SUT SILK 0 30XBRD TIE 6 (SUTURE) ×1 IMPLANT
SUT SILK 0 CT 1 30 (SUTURE) ×3 IMPLANT
SUT SILK 3 0 SH CR/8 (SUTURE) ×6 IMPLANT
SUT VIC AB 0 CT1 27 (SUTURE) ×4
SUT VIC AB 0 CT1 27XBRD ANTBC (SUTURE) ×2 IMPLANT
SUT VIC AB 2-0 SH 27 (SUTURE) ×2
SUT VIC AB 2-0 SH 27X BRD (SUTURE) ×1 IMPLANT
SUT VIC AB 2-0 UR6 27 (SUTURE) IMPLANT
SUT VIC AB 3-0 SH 18 (SUTURE) IMPLANT
SUT VIC AB 3-0 SH 27 (SUTURE) ×4
SUT VIC AB 3-0 SH 27X BRD (SUTURE) ×2 IMPLANT
SUT VIC AB 4-0 RB1 27 (SUTURE) ×18
SUT VIC AB 4-0 RB1 27XBRD (SUTURE) ×9 IMPLANT
SUT VICRYL 0 27 CT2 27 ABS (SUTURE) ×3 IMPLANT
SYR 30ML LL (SYRINGE) ×3 IMPLANT
SYRINGE 10CC LL (SYRINGE) ×3 IMPLANT
SYSTEM UROSTOMY GENTLE TOUCH (WOUND CARE) IMPLANT
TOWEL BLUE STERILE X RAY DET (MISCELLANEOUS) ×3 IMPLANT
TOWEL OR 17X26 10 PK STRL BLUE (TOWEL DISPOSABLE) ×3 IMPLANT

## 2015-04-01 NOTE — Anesthesia Preprocedure Evaluation (Addendum)
Anesthesia Evaluation  Patient identified by MRN, date of birth, ID band Patient awake    Reviewed: Allergy & Precautions, NPO status , Patient's Chart, lab work & pertinent test results  Airway Mallampati: I  TM Distance: >3 FB Neck ROM: Full    Dental  (+) Teeth Intact, Poor Dentition, Loose, Dental Advisory Given   Pulmonary former smoker,    breath sounds clear to auscultation       Cardiovascular negative cardio ROS   Rhythm:Regular Rate:Normal     Neuro/Psych negative neurological ROS  negative psych ROS   GI/Hepatic negative GI ROS, Neg liver ROS,   Endo/Other  negative endocrine ROS  Renal/GU negative Renal ROS  negative genitourinary   Musculoskeletal  (+) Arthritis ,   Abdominal   Peds negative pediatric ROS (+)  Hematology negative hematology ROS (+)   Anesthesia Other Findings   Reproductive/Obstetrics negative OB ROS                          Lab Results  Component Value Date   WBC 10.5 03/26/2015   HGB 13.5 03/26/2015   HCT 39.2 03/26/2015   MCV 99.0 03/26/2015   PLT 255 03/26/2015   Lab Results  Component Value Date   CREATININE 0.86 03/26/2015   BUN 5* 03/26/2015   NA 134* 03/26/2015   K 4.8 03/26/2015   CL 99* 03/26/2015   CO2 28 03/26/2015    Anesthesia Physical Anesthesia Plan  ASA: II  Anesthesia Plan: General   Post-op Pain Management: GA combined w/ Regional for post-op pain   Induction: Intravenous  Airway Management Planned: Oral ETT  Additional Equipment:   Intra-op Plan:   Post-operative Plan:   Informed Consent: I have reviewed the patients History and Physical, chart, labs and discussed the procedure including the risks, benefits and alternatives for the proposed anesthesia with the patient or authorized representative who has indicated his/her understanding and acceptance.   Dental advisory given  Plan Discussed with:  CRNA  Anesthesia Plan Comments:        Anesthesia Quick Evaluation

## 2015-04-01 NOTE — Progress Notes (Signed)
Dopamine started m mcg/kg/min

## 2015-04-01 NOTE — Progress Notes (Signed)
Spoke with Dr Louis Meckel re BP, Neo gtt.  Instructed to start Dopamine to keep map above 60 if needed.  Order received to tx pt to stepdown bed.

## 2015-04-01 NOTE — Progress Notes (Signed)
Patient ID: Howard Valentine, male   DOB: 1956-05-28, 59 y.o.   MRN: [id number] Took call from [place] about Howard Valentine.    He was tachycardic to about 180 with SVT on EKG.  He was borderline hypotensive.   E-link contacted and they will order a bolus of fluid, check labs and hold the dopamine.

## 2015-04-01 NOTE — Progress Notes (Signed)
[facility] Progress Note Patient Name: QUINTRELL BAZE DOB: 1955-09-01 MRN: [id number]   Date of Service  04/01/2015  HPI/Events of Note  Called by RN , per Urology  S/p OR cystectomy, long case Tachy, likely SV, pt was recently put on dopmaine by primary. Pt alert, no distress  eICU Interventions  STAT ecg, trop, cortisol, tsh, lytes, cbc Dc dop, add neo Bolus Lactic acid      Intervention Category Major Interventions: Arrhythmia - evaluation and management  Aireal Slater J. 04/01/2015, 8:44 PM

## 2015-04-01 NOTE — Anesthesia Postprocedure Evaluation (Signed)
  Anesthesia Post-op Note  Patient: Howard Valentine  Procedure(s) Performed: Procedure(s): CYSTECTOMY WITH ILEAL CONDUIT  (N/A)  Patient Location: PACU  Anesthesia Type:General  Level of Consciousness: awake, alert  and oriented  Airway and Oxygen Therapy: Patient Spontanous Breathing  Post-op Pain: none  Post-op Assessment: Post-op Vital signs reviewed and Patient's Cardiovascular Status Stable              Post-op Vital Signs: Reviewed and stable  Last Vitals:  Filed Vitals:   04/01/15 1952  BP:   Pulse:   Temp: 36.5 C  Resp:     Complications: No apparent anesthesia complications

## 2015-04-01 NOTE — Progress Notes (Signed)
eLink Physician-Brief Progress Note Patient Name: Howard Valentine DOB: [birth year] MRN: [id number]   Date of Service  04/01/2015  HPI/Events of Note  LA noted Increase volume, repeat Dc LR as K was up prior slight No fevers,  demarg WBC hypolomeia  eICU Interventions  LA repeat Volume At risk transient bacteremia cosnider empitc abx PCT     Intervention Category Major Interventions: Hypotension - evaluation and management  Raylene Miyamoto. 04/01/2015, 10:31 PM

## 2015-04-01 NOTE — Transfer of Care (Signed)
Immediate Anesthesia Transfer of Care Note  Patient: Howard Valentine  Procedure(s) Performed: Procedure(s): CYSTECTOMY WITH ILEAL CONDUIT  (N/A)  Patient Location: PACU  Anesthesia Type:General  Level of Consciousness:  sedated, patient cooperative and responds to stimulation  Airway & Oxygen Therapy:Patient Spontanous Breathing and Patient connected to face mask oxgen  Post-op Assessment:  Report given to PACU RN and Post -op Vital signs reviewed and stable  Post vital signs:  Reviewed and stable. BP low, phenylephrine gtt restarted.  Last Vitals:  Filed Vitals:   04/01/15 1800  BP: 64/41  Pulse: 80  Temp:   Resp: 16    Complications: No apparent anesthesia complications

## 2015-04-01 NOTE — Anesthesia Procedure Notes (Addendum)
Epidural Patient location during procedure: pre-op Start time: 04/01/2015 7:10 AM End time: 04/01/2015 7:15 AM  Staffing Anesthesiologist: Suella Broad D Performed by: anesthesiologist   Preanesthetic Checklist Completed: patient identified, site marked, surgical consent, pre-op evaluation, timeout performed, IV checked, risks and benefits discussed and monitors and equipment checked  Epidural Patient position: sitting Prep: DuraPrep Patient monitoring: heart rate, continuous pulse ox and blood pressure Approach: midline Location: thoracic (1-12) Injection technique: LOR saline  Needle:  Needle type: Tuohy  Needle gauge: 18 G Needle length: 9 cm and 9 Catheter type: closed end flexible Catheter size: 20 Guage Test dose: negative and 1.5% lidocaine with Epi 1:200 K  Assessment Events: blood not aspirated, injection not painful, no injection resistance, negative IV test and no paresthesia  Additional Notes No complications noted. LOR @ 5.  Patient tolerated the insertion well without complications.Reason for block:procedure for pain  Procedure Name: Intubation Date/Time: 04/01/2015 7:52 AM Performed by: Dione Booze Pre-anesthesia Checklist: Emergency Drugs available, Suction available, Patient being monitored and Patient identified Patient Re-evaluated:Patient Re-evaluated prior to inductionOxygen Delivery Method: Circle system utilized Preoxygenation: Pre-oxygenation with 100% oxygen Intubation Type: IV induction Laryngoscope Size: Mac and 4 Grade View: Grade II Tube type: Oral Tube size: 8.0 mm Number of attempts: 1 Airway Equipment and Method: Stylet Placement Confirmation: ETT inserted through vocal cords under direct vision and positive ETCO2 Secured at: 22 cm Tube secured with: Tape Dental Injury: Teeth and Oropharynx as per pre-operative assessment  Comments: Poor dentition. Loose upper rt tooth. Gauze bite block

## 2015-04-01 NOTE — Progress Notes (Signed)
Dr. Jeffie Pollock updated about pt status and JP drain dressing saturation. Told to change and reinforce dressing. VSS stable o n 50 mcg of Neosynephrine. Will continue to monitor.

## 2015-04-01 NOTE — H&P (Signed)
Patient presents for cystectomy for HG invasive bladder cancer. He is otherwise healthy.  I last saw him in the office two weeks ago.  He has had no changes to his PMH.  Past Medical History  Diagnosis Date  . Arthritis   . Hematuria   . Lower urinary tract symptoms (LUTS)   . Prostate cancer urology--  dr herrick/ dr Valere Dross    DX 2014 ---  PSA 15.2,  Gleason 6/7,  vol 28cc,  s/p radical prostatectomy and external beam radiation therapy--  recurrent , PSA 0.04 (12-28-2014)  . Nocturia   . Urinary frequency    No current facility-administered medications on file prior to encounter.   Current Outpatient Prescriptions on File Prior to Encounter  Medication Sig Dispense Refill  . phenazopyridine (PYRIDIUM) 200 MG tablet Take 1 tablet (200 mg total) by mouth 3 (three) times daily as needed for pain. (Patient not taking: Reported on 03/18/2015) 10 tablet 0   Past Surgical History  Procedure Laterality Date  . Prostate biopsy    . Robot assisted laparoscopic radical prostatectomy N/A 02/08/2013    Procedure: ROBOTIC ASSISTED LAPAROSCOPIC RADICAL PROSTATECTOMY LEVEL 2;  Surgeon: Molli Hazard, MD;  Location: [facility];  Service: Urology;  Laterality: N/A;  ROBOTIC PROSTATECTOMY, BILATERAL PELVIC LYMPH NODE DISSECTION, RIGHT NON NERVE SPARING   . Lymphadenectomy Bilateral 02/08/2013    Procedure: LYMPHADENECTOMY;  Surgeon: Molli Hazard, MD;  Location: [facility];  Service: Urology;  Laterality: Bilateral;  . Cystoscopy with retrograde pyelogram, ureteroscopy and stent placement Bilateral 03/01/2015    Procedure: CYSTOSCOPY WITH BLADDER BIOPSY RETROGRADE BILATERAL  PYELOGRAM;  Surgeon: [name], MD;  Location: [facility];  Service: Urology;  Laterality: Bilateral;   Filed Vitals:   04/01/15 0534  BP: 126/79  Pulse: 102  Temp: 98 F (36.7 C)  Resp: 18   NAD RRR CTA-B ABdomen is soft Patient has been marked. Ext symmetric  CT - thickened bladder  without lymphadenopathy No results for input(s): WBC, HGB, HCT in the last 72 hours. No results for input(s): NA, K, CL, CO2, GLUCOSE, BUN, CREATININE, CALCIUM in the last 72 hours. No results for input(s): LABPT, INR in the last 72 hours. No results for input(s): PSA in the last 72 hours. No results for input(s): LABURIN in the last 72 hours. No results found for this or any previous visit.   Imp: HG invasive bladder cancer Plan - cystectomy with ileal conduit.

## 2015-04-01 NOTE — Progress Notes (Signed)
CRITICAL VALUE ALERT  Critical value received:  Lactic 3.4  Date of notification:  04/01/15  Time of notification:  2224  Critical value read back:Yes.    Nurse who received alert:  Leola Brazil, RN  MD notified (1st page):  Dr. Titus Mould  Time of first page:  2220  MD notified (2nd page):  Time of second page:  Responding MD:  Dr. Titus Mould  Time MD responded:  2220

## 2015-04-01 NOTE — Op Note (Signed)
Preoperative diagnosis:  1. High grade invasive bladder cancer  Postoperative diagnosis:  1. same  Procedure: 1. Cystectomy with ileal urinary conduit 2. Rigid sigmoidoscopy performed by Dr. Johnathan Hausen  Surgeon: [name], MD 1st assistant: Jearld Adjutant, MD  Anesthesia: General  Complications: None  Intraoperative findings: Bladder stuck anteriorly and all around, especially at the vesico-urethral anastamosis and the posterior plan where the prosate was removed.  Dr. Hassell Done of general surgery consulted intraoperatively to evaluate resection bed and intergrity of the rectum.  He performed rigid simoidoscopy confirming no violation of the rectal mucosa.  Left sided lymph node dissection was attempted but there were no planes and no tissue within the boundaries of external and interal ilac vessels.    EBL: 1500cc  Specimens: Frozen sections sent from distal ureteral margins - negative.  A distal urethral frozen section was also send was negative.  The bladder was sent as a permanent specimen.  Indication: Howard Valentine is a 60 y.o. patient with HG invasive bladder cancer.  He has a history of prostate cancer as well and underwent a prostatectomy in 2014.Marland Kitchen  After reviewing the management options for treatment, he elected to proceed with the above surgical procedure(s). We have discussed the potential benefits and risks of the procedure, side effects of the proposed treatment, the likelihood of the patient achieving the goals of the procedure, and any potential problems that might occur during the procedure or recuperation. Informed consent has been obtained.  Description of procedure:  Prior to taking the patient back to the OR and epidural was placed.  The patient was taken to the operating room and general anesthesia was induced.  The patient was placed in the modified lithotomy position with a gel pad placed under his sacrum. prepped and draped in the usual sterile fashion,  and preoperative antibiotics were administered. A preoperative time-out was performed.   I started by making the ostomy incision which had been marked pre-operatively.  The skin and most of the fat were taken down to the anterior rectus sheath.  Lower midline incision was made and taken down to the anterior rectus fascia.  We then sharply opened up the fascia in the midline and split the rectus muscle before opening of the peritoneum.  The urachus had been taken down during his prior surgery.  We started by mobilizing the bowel out of the pelvis and then packing it into the upper abdomen.  A Bookwalter retractor was used for retraction and placed on the bedside rail at this point.  Once the bowel had been mobilized enough to pack away we isolated the right ureter enough to get a vesi-loop around it.  The same was done on the left.  We then moblized the bladder anteriorly.  The bladder was adherent to the pelvic side wall as well as the pubic bone.  Using a combination of blunt dissection, electric cautery and sharp dissection the anterior and lateral walls of the bladder were dissected and freed from the side wall.  Then using the ligasure the bladder pedicles were taken starting on the left.  We divided the left ureter and came under the bladder to establish the posterior plane bluntly.  The distal left ureteral margin was sent as a frozen specimen - which ultimately returned back as negative for malignancy.  The ureter was tagged with a chromic suture.  The right pedicle was then taken in a similar fashion.  The right ureter was taken and the distal margin sent  as a frozen section - returned as negative for malignancy.   I then began to develop the posterior plane down towards the bladder neck. This proved to be incredibly difficult with dense adhesions to the rectum where his prostate was resected.  There were numerous laparoscopic clips in this area from prior RALP.  Getting these adhesions down ultimately  required a 15 blade knife. Once most of the posterior plane had been freed, I took the urethra sharply with a 15 blade.  A stitch was placed at the at the urethra and after a frozen section was sent as a distal urethral margin (returned negative) the stitch was used to control the bleeding.    I then attempted to obtain lymph node tissue from the obturator fossa, internal and external iliac nodal packets.  There was very little tissue in this area, as it had mostly been removed at the time of his prostatectomy.  Having spent ~1 hour separating out the vessels and getting very little tissue I opted to forego the right nodal dissection.  Next we lysed the left ureter more proximal to free it and provide length so that it could be tunneled through the sigmoid mesentary and passed to the right side.  The terminal ileum was identified and 15 cm of ileum was then isolated proximally from the TI. Using a head lamp to transilluminate the mesentery the mesentery was then taken down so as to avoid any large arcades or vessels. The conduit loop was noted to have a strong blood supply. Using a gastrointestinal stapler 51mm the conduit loop was then transected and removed from continuity of the ileum. This was pushed into the pelvis and the 2 ends of ileum were then reanastomosed using a gastrointestinal 55 mm stapler by passing the 2 ends into the separate loops and firing the stapler. The TA stapler was then used to close the opening of the bowel so as to create a side to side functional end-to-end anastomosis of the ileum. The staple line was then imbricated with 3-0 silk sutures.  The conduit was then opened the distal end and irrigated several times to clear any of the fecal contents. The leftt ureter was then spatulated and the distal end resected to freshen up the end. A small enterotomy was made in the bottom of the conduit loop into 4-0 Vicryl's were placed at the apex of the spatulated ureter and the bottom of  the enterotomy tied down and then sewn up to the apex of the ureter in a running fashion. Once at the top 2 stitches were tied off. Halfway up the anastomosis a blue 75 cm single-J stent was passed through the conduit and into the ureter. The right ureter was then spatulated and the enterotomy was then made on the contralateral side of the conduit loop from the left ureteral ileal anastomosis. Again,  4-0 Vicryl's were placed at the bottom of the spatulated ureter and the sutures were run up each side of the anastomosis, and at the halfway point a red stent was placed in the ureter. The anastomosis was then completed and the sutures were tied at the top.  The conduit was then brought through the ostomy site what had been created at the beginning of the case.  The stoma was secured to the fascia with a 3-0 chromic.  The stoma was then matured with 3-0 vicryl at the corners and 4-0 vicryls in between.  A 19 [place] Blake drain was then placed in the pelvis  at the ureteral ileal anastomoses. This was brought out the patient's left lower quadrant and sewn to the skin with a 3-0 nylon.  Fascia was then closed with a 0-loop PDS starting from each end and meeting/tying the sutures in the middle.  The would was then irrigated prior to closing the skin with a stapler.  Ostomy appliance was applied and the incision was dressed.  The patient was subsequently extubated and returned to the PACU in stable condition.   [name], M.D.

## 2015-04-01 NOTE — Plan of Care (Signed)
Problem: Phase I Progression Outcomes Goal: Incision/dressings dry and intact Outcome: Not Progressing Dressing saturated, MD aware

## 2015-04-01 NOTE — Progress Notes (Signed)
AssistedDr. Smith Robert with epidural placement. Side rails up, monitors on throughout procedure. See vital signs in flow sheet. Tolerated Procedure well.

## 2015-04-02 ENCOUNTER — Encounter (HOSPITAL_COMMUNITY): Payer: Self-pay | Admitting: Pulmonary Disease

## 2015-04-02 DIAGNOSIS — E872 Acidosis: Secondary | ICD-10-CM

## 2015-04-02 DIAGNOSIS — C679 Malignant neoplasm of bladder, unspecified: Principal | ICD-10-CM

## 2015-04-02 DIAGNOSIS — J9601 Acute respiratory failure with hypoxia: Secondary | ICD-10-CM

## 2015-04-02 DIAGNOSIS — R6521 Severe sepsis with septic shock: Secondary | ICD-10-CM

## 2015-04-02 DIAGNOSIS — E875 Hyperkalemia: Secondary | ICD-10-CM

## 2015-04-02 DIAGNOSIS — A419 Sepsis, unspecified organism: Secondary | ICD-10-CM

## 2015-04-02 LAB — BASIC METABOLIC PANEL
ANION GAP: 6 (ref 5–15)
BUN: 8 mg/dL (ref 6–20)
CHLORIDE: 106 mmol/L (ref 101–111)
CO2: 21 mmol/L — AB (ref 22–32)
Calcium: 6.7 mg/dL — ABNORMAL LOW (ref 8.9–10.3)
Creatinine, Ser: 1.01 mg/dL (ref 0.61–1.24)
GFR calc Af Amer: >60 mL/min (ref >60)
GLUCOSE: 190 mg/dL — AB (ref 65–99)
POTASSIUM: 4.1 mmol/L (ref 3.5–5.1)
Sodium: 133 mmol/L — ABNORMAL LOW (ref 135–145)

## 2015-04-02 LAB — PROCALCITONIN: Procalcitonin: 0.75 ng/mL

## 2015-04-02 LAB — CBC
HEMATOCRIT: 22.8 % — AB (ref 39.0–52.0)
Hemoglobin: 7.7 g/dL — ABNORMAL LOW (ref 13.0–17.0)
MCH: 31.6 pg (ref 26.0–34.0)
MCHC: 33.8 g/dL (ref 30.0–36.0)
MCV: 93.4 fL (ref 78.0–100.0)
PLATELETS: 144 10*3/uL — AB (ref 150–400)
RBC: 2.44 MIL/uL — AB (ref 4.22–5.81)
RDW: 17.9 % — ABNORMAL HIGH (ref 11.5–15.5)
WBC: 20.5 10*3/uL — ABNORMAL HIGH (ref 4.0–10.5)

## 2015-04-02 LAB — GLUCOSE, CAPILLARY
GLUCOSE-CAPILLARY: 105 mg/dL — AB (ref 65–99)
GLUCOSE-CAPILLARY: 217 mg/dL — AB (ref 65–99)
GLUCOSE-CAPILLARY: 98 mg/dL (ref 65–99)
Glucose-Capillary: 161 mg/dL — ABNORMAL HIGH (ref 65–99)
Glucose-Capillary: 137 mg/dL — ABNORMAL HIGH (ref 65–99)

## 2015-04-02 LAB — HEMOGLOBIN AND HEMATOCRIT, BLOOD
HEMATOCRIT: 20.8 % — AB (ref 39.0–52.0)
Hemoglobin: 7.3 g/dL — ABNORMAL LOW (ref 13.0–17.0)

## 2015-04-02 LAB — TROPONIN I: Troponin I: <0.03 ng/mL (ref <0.031)

## 2015-04-02 LAB — MRSA PCR SCREENING: MRSA by PCR: NEGATIVE

## 2015-04-02 LAB — LACTIC ACID, PLASMA
LACTIC ACID, VENOUS: 1.3 mmol/L (ref 0.5–2.0)
LACTIC ACID, VENOUS: 1.8 mmol/L (ref 0.5–2.0)
Lactic Acid, Venous: 1.7 mmol/L (ref 0.5–2.0)

## 2015-04-02 LAB — CORTISOL: Cortisol, Plasma: 25.6 ug/dL

## 2015-04-02 MED ORDER — SODIUM CHLORIDE 0.9 % IV BOLUS (SEPSIS): 1000.0000 mL | Freq: Once | INTRAVENOUS | Status: DC

## 2015-04-02 MED ORDER — NALOXONE HCL 0.4 MG/ML IJ SOLN: 0.4000 mg | INTRAMUSCULAR | Status: DC | PRN

## 2015-04-02 MED ORDER — LACTATED RINGERS IV SOLN
INTRAVENOUS | Status: DC
  Administered 2015-04-02: 12:00:00 via INTRAVENOUS

## 2015-04-02 MED ORDER — DIPHENHYDRAMINE HCL 12.5 MG/5ML PO ELIX: 12.5000 mg | ORAL_SOLUTION | Freq: Four times a day (QID) | ORAL | Status: DC | PRN

## 2015-04-02 MED ORDER — SODIUM CHLORIDE 0.9 % IV SOLN
12.5000 mg | Freq: Four times a day (QID) | INTRAVENOUS | Status: DC | PRN
  Administered 2015-04-02 – 2015-04-03 (×2): 12.5 mg via INTRAVENOUS
  Filled 2015-04-02 (×4): qty 0.5

## 2015-04-02 MED ORDER — ACETAMINOPHEN 10 MG/ML IV SOLN
1000.0000 mg | Freq: Four times a day (QID) | INTRAVENOUS | Status: AC
  Administered 2015-04-02 – 2015-04-03 (×4): 1000 mg via INTRAVENOUS
  Filled 2015-04-02 (×4): qty 100

## 2015-04-02 MED ORDER — SODIUM CHLORIDE 0.9 % IJ SOLN: 9.0000 mL | INTRAMUSCULAR | Status: DC | PRN

## 2015-04-02 MED ORDER — ONDANSETRON HCL 4 MG/2ML IJ SOLN: 4.0000 mg | Freq: Four times a day (QID) | INTRAMUSCULAR | Status: DC | PRN

## 2015-04-02 MED ORDER — DIPHENHYDRAMINE HCL 50 MG/ML IJ SOLN: 12.5000 mg | Freq: Four times a day (QID) | INTRAMUSCULAR | Status: DC | PRN

## 2015-04-02 MED ORDER — VANCOMYCIN HCL IN DEXTROSE 750-5 MG/150ML-% IV SOLN
750.0000 mg | Freq: Two times a day (BID) | INTRAVENOUS | Status: DC
  Filled 2015-04-02: qty 150

## 2015-04-02 MED ORDER — HYDROMORPHONE 0.3 MG/ML IV SOLN
INTRAVENOUS | Status: DC
  Administered 2015-04-02: 1.2 mg via INTRAVENOUS
  Administered 2015-04-02: 11:00:00 via INTRAVENOUS
  Filled 2015-04-02: qty 25

## 2015-04-02 MED ORDER — HYDROMORPHONE 0.3 MG/ML IV SOLN
INTRAVENOUS | Status: DC
  Administered 2015-04-02: 2.8 mg via INTRAVENOUS
  Administered 2015-04-02: 0.8 mg via INTRAVENOUS
  Administered 2015-04-02: 1.2 mg via INTRAVENOUS
  Administered 2015-04-03: 0.8 mg via INTRAVENOUS
  Administered 2015-04-03: 0 mg via INTRAVENOUS

## 2015-04-02 MED ORDER — VANCOMYCIN HCL IN DEXTROSE 1-5 GM/200ML-% IV SOLN
1000.0000 mg | Freq: Once | INTRAVENOUS | Status: AC
  Administered 2015-04-02: 1000 mg via INTRAVENOUS
  Filled 2015-04-02: qty 200

## 2015-04-02 MED ORDER — HEPARIN SODIUM (PORCINE) 5000 UNIT/ML IJ SOLN
5000.0000 [IU] | Freq: Three times a day (TID) | INTRAMUSCULAR | Status: DC
  Administered 2015-04-02 – 2015-04-07 (×13): 5000 [IU] via SUBCUTANEOUS
  Filled 2015-04-02 (×13): qty 1

## 2015-04-02 NOTE — Consult Note (Signed)
Name: Howard Valentine MRN: [id number] DOB: 1955-08-27    ADMISSION DATE:  04/01/2015 CONSULTATION DATE:  04/02/2015  REFERRING MD :  Urology Service  CHIEF COMPLAINT:  Septic Shock  BRIEF PATIENT DESCRIPTION: 59 year old male with high-grade invasive bladder cancer. Underwent cystectomy with ileal conduit on 9/13. Patient had postoperative hypotension in the PACU.  SIGNIFICANT EVENTS  9/12 - Admit to [facility] 9/12 - Cystectomy & ileal conduit 9/12 - Transfer to ICU for shock from PACU  STUDIES:   HISTORY OF PRESENT ILLNESS:  59 year old male with high-grade bladder cancer. Status post cystectomy with placement of ileal conduit. Denies any chest pain or pressure. Denies any difficulty breathing or cough. No nausea or vomiting. Does have postoperative abdominal pain. Denies any subjective fever or chills but does feel mildly diaphoretic this morning. Patient underwent volume resuscitation overnight and was transitioned to vasopressor support. Currently has an epidural in place for pain control.  PAST MEDICAL HISTORY :   has a past medical history of Arthritis; Hematuria; Lower urinary tract symptoms (LUTS); Prostate cancer (urology--  dr herrick/ dr Valere Dross); Nocturia; and Urinary frequency.  has past surgical history that includes Prostate biopsy; Robot assisted laparoscopic radical prostatectomy (N/A, 02/08/2013); Lymphadenectomy (Bilateral, 02/08/2013); and Cystoscopy with retrograde pyelogram, ureteroscopy and stent placement (Bilateral, 03/01/2015). Prior to Admission medications   Medication Sig Start Date End Date Taking? Authorizing Provider  acetaminophen (TYLENOL) 500 MG tablet Take 500-1,000 mg by mouth every 6 (six) hours as needed for moderate pain.   Yes Historical Provider, MD  calcium carbonate (TUMS EX) 750 MG chewable tablet Chew 1 tablet by mouth daily.   Yes Historical Provider, MD  loratadine (CLARITIN) 10 MG tablet Take 10 mg by mouth daily as needed for allergies.   Yes  Historical Provider, MD  phenazopyridine (PYRIDIUM) 200 MG tablet Take 1 tablet (200 mg total) by mouth 3 (three) times daily as needed for pain. Patient not taking: Reported on 03/18/2015 03/01/15   [name], MD   No Known Allergies  FAMILY HISTORY:  Family History  Problem Relation Age of Onset  . Colon cancer     SOCIAL HISTORY:  reports that he quit smoking about 15 years ago. His smoking use included Cigarettes. He has a 20 pack-year smoking history. He has never used smokeless tobacco. He reports that he drinks about 12.6 oz of alcohol per week. He reports that he does not use illicit drugs.  REVIEW OF SYSTEMS:  Denies any rashes or bruising. Denies any headache or vision changes. A pertinent 14 point review of systems is negative except as per the history of presenting illness.  SUBJECTIVE:   VITAL SIGNS: Temp:  [97.7 F (36.5 C)-99.4 F (37.4 C)] 99.4 F (37.4 C) (09/13 0345) Pulse Rate:  [75-186] 82 (09/13 0600) Resp:  [8-16] 14 (09/13 0600) BP: (47-122)/(27-83) 110/50 mmHg (09/13 0645) SpO2:  [94 %-100 %] 100 % (09/13 0600)  PHYSICAL EXAMINATION: General:  Awake. Alert. No acute distress. Wife at bedside..  Integument:  No cyanosis. Warm & dry. No rash on exposed skin. No bruising. Lymphatics:  No appreciated cervical or supraclavicular lymphadenoapthy. HEENT:  Tacky mucus membranes. No oral ulcers. No scleral injection or icterus. PERRL. Cardiovascular:  Regular rate. No edema. No appreciable JVD.  Pulmonary:  Good aeration & clear to auscultation bilaterally. Symmetric chest wall rise. No accessory muscle use on nasal cannula oxygen. Abdomen: Soft. Hypoactive bowel sounds. Abdominal dressing in place clean & dry. Abdominal drain in place with minimal blood-tinged serous  drainage. Urostomy in place. Musculoskeletal:  Normal bulk and tone. Hand grip strength 5/5 bilaterally. No joint deformity or effusion appreciated. Neurological:  CN 2-12 grossly in tact. No  meningismus. Moving all 4 extremities equally.  Psychiatric:  Mood and affect congruent. Speech normal rhythm, rate & tone.    Recent Labs Lab 04/01/15 1814 04/01/15 2120 04/02/15 0348  NA 135 135 133*  K 5.6* 4.7 4.1  CL 105 106 106  CO2 20* 21* 21*  BUN 8 10 8   CREATININE 1.13 1.05 1.01  GLUCOSE 252* 251* 190*    Recent Labs Lab 04/01/15 1814 04/01/15 2120 04/02/15 0348  HGB 8.8* 8.3* 7.7*  HCT 26.0* 24.3* 22.8*  WBC 22.9* 23.4* 20.5*  PLT 158 135* 144*   No results found.  ASSESSMENT / PLAN: 59 year old male status post cystectomy with ileal conduit for high-grade bladder cancer. Patient did undergo volume resuscitation overnight. Has a lactic acidosis postoperatively that appears to be largely stable. No chest pain or pressure. Patient does have anemia without obvious signs of blood loss. Currently in a state of shock on a vasopressor infusions through peripheral IV. I did discuss the risk of hand necrosis with the patient at bedside with wife present. Given his difficulty with obtaining labs I would favor placement of central IV access. Given his persistent hypotension and leukocytosis I would also favor broadening antibiotics and checking blood cultures at this time. We will continue to follow this patient while he remains in the ICU.  1. Septic shock: Possible urologic/intra-abdominal source. Checking blood cultures. Plan for central line placement for vasopressor support with Neo-Synephrine to maintain mean arterial pressure >65. Checking blood cultures. Patient continuing on Rocephin & adding vancomycin to her regimen. Trending cardiac biomarkers. Continuing to improve Procalcitonin. 2. Lactic acidosis: Continuing to trend lactic acid every 6 hours. 3. Anemia: No signs of active bleeding. Repeat hemoglobin at 1400 hrs today.  4. Acute hypoxic respiratory failure: Likely some element of ulnar edema from volume resuscitation. Instructed patient on proper technique with  incentive spirometry to minimize atelectasis. Continue supplemental oxygen to maintain saturation greater than 90%. 5. Hyponatremia: Likely due to volume resuscitation and fluids. Continuing to monitor electrolytes daily. 6. Hyperkalemia: Resolved. Continuing to monitor electrolytes daily. 7. Thrombocytopenia: Likely some element of dilution. Continue to monitor platelet count daily. 8. Postoperative pain: Management per primary service. Currently has epidural in place with plan to transition to PCA per documentation. 9. Diet: Per primary service. 10. Prophylaxis: SCDs & heparin subcutaneous every 8 hours.  I spent a total of 36 minutes of critical care time today caring for the patient, reviewing the patient's electronic medical record, & discussing the plan of care with the patient and his wife at bedside.  Sonia Baller Ashok Cordia, M.D. [facility] Pulmonary [facility] Critical Care Pager:  [phone] After 3pm or if no response, call [phone] 04/02/2015, 8:40 AM

## 2015-04-02 NOTE — Progress Notes (Signed)
ANTIBIOTIC CONSULT NOTE - INITIAL  Pharmacy Consult for Vancomycin Indication: sepsis  No Known Allergies  Patient Measurements: Height: 5\' 6"  (167.6 cm) Weight: 138 lb 8 oz (62.823 kg) IBW/kg (Calculated) : 63.8 Adjusted Body Weight:   Vital Signs: Temp: 98.4 F (36.9 C) (09/13 0835) Temp Source: Oral (09/13 0835) BP: 110/50 mmHg (09/13 0645) Pulse Rate: 82 (09/13 0600) Intake/Output from previous day: 09/12 0701 - 09/13 0700 In: 12447.2 [I.V.:10160; Blood:670; IV Piggyback:1550] Out: 2790 [Urine:1050; Drains:240; Blood:1500] Intake/Output from this shift:    Labs:  Recent Labs  04/01/15 1814 04/01/15 2120 04/02/15 0348  WBC 22.9* 23.4* 20.5*  HGB 8.8* 8.3* 7.7*  PLT 158 135* 144*  CREATININE 1.13 1.05 1.01   Estimated Creatinine Clearance: 69.1 mL/min (by C-G formula based on Cr of 1.01). No results for input(s): VANCOTROUGH, VANCOPEAK, VANCORANDOM, GENTTROUGH, GENTPEAK, GENTRANDOM, TOBRATROUGH, TOBRAPEAK, TOBRARND, AMIKACINPEAK, AMIKACINTROU, AMIKACIN in the last 72 hours.   Microbiology: Recent Results (from the past 720 hour(s))  MRSA PCR Screening     Status: None   Collection Time: 04/01/15 10:43 PM  Result Value Ref Range Status   MRSA by PCR NEGATIVE NEGATIVE Final    Comment:        The GeneXpert MRSA Assay (FDA approved for NASAL specimens only), is one component of a comprehensive MRSA colonization surveillance program. It is not intended to diagnose MRSA infection nor to guide or monitor treatment for MRSA infections.     Medical History: Past Medical History  Diagnosis Date  . Arthritis   . Hematuria   . Lower urinary tract symptoms (LUTS)   . Prostate cancer urology--  dr herrick/ dr Valere Dross    DX 2014 ---  PSA 15.2,  Gleason 6/7,  vol 28cc,  s/p radical prostatectomy and external beam radiation therapy--  recurrent , PSA 0.04 (12-28-2014)  . Nocturia   . Urinary frequency      Assessment: 2 yoM with high-grade invasive bladder  cancer. Underwent cystectomy with ileal conduit on 9/12. Patient had postoperative hypotension in the PACU and was transferred to ICU.  WBC, LA, and PCT elevated.  Patient on Ceftriaxone.  Pharmacy consulted to dose vancomycin in addition for septic shock.  Patient already receiving Ceftriaxone.  Possible urologic/intra-abdominal source.  9/12 >> Ceftriaxone >> 9/13 >> Vancomycin >>  9/13 blood cx x 2: ordered  CrCl~68 ml/min (CG)  Goal of Therapy:  Vancomycin trough level 15-20 mcg/ml  Doses adjusted per renal function Eradication of infection  Plan:  1.  Vancomycin 1g x 1 then 750 mg IV q12h. 2.  F/u culture results, trough levels as indicated, clinical course.  Hershal Coria 04/02/2015,9:53 AM

## 2015-04-02 NOTE — Progress Notes (Signed)
1 Day Post-Op Subjective: S/p cystectomy, trouble maintaining BP in PACU, got some dopamine and then transitioned to neosynephrine and on high level of fluids. Pt has good pain control and no nausea or vomiting. No flatus. Lactate overnight elevated, consistent with large open surgery, troponins negative.   Objective: Vital signs in last 24 hours: Temp:  [97.7 F (36.5 C)-99.4 F (37.4 C)] 99.4 F (37.4 C) (09/13 0345) Pulse Rate:  [75-186] 82 (09/13 0600) Resp:  [8-16] 14 (09/13 0600) BP: (47-122)/(27-83) 110/50 mmHg (09/13 0645) SpO2:  [94 %-100 %] 100 % (09/13 0600)  Intake/Output from previous day: 09/12 0701 - 09/13 0700 In: 12447.2 [I.V.:10160; Blood:670; IV Piggyback:1550] Out: 2790 [Urine:1050; Drains:240; Blood:1500] Intake/Output this shift:    Physical Exam:  General: Alert and oriented CV: RRR Lungs: Clear Abdomen: Soft, ND, incision c/d/i, ostomy P/P/P Incisions: Ext: NT, No erythema  Lab Results:  Recent Labs  04/01/15 1814 04/01/15 2120 04/02/15 0348  HGB 8.8* 8.3* 7.7*  HCT 26.0* 24.3* 22.8*   BMET  Recent Labs  04/01/15 2120 04/02/15 0348  NA 135 133*  K 4.7 4.1  CL 106 106  CO2 21* 21*  GLUCOSE 251* 190*  BUN 10 8  CREATININE 1.05 1.01  CALCIUM 6.9* 6.7*     Studies/Results: No results found.   Assessment/Plan: 59 y.o. male s/p open radical cystectomy and ileal conduit on 04/01/15.  - Transition to PCA for hypotension - IV tylenol - Continue Entereg - Wean Neosynephrin  - LR at 125 - NPO - Hgb stable at 7.7, will consider PRBC for low BP if trouble weaning pressors - WBC elevated and down trending, likely related to stress response, afebrile  - Making good UOP - Wound ostomy consulted - SCDs, IC, and heparin prophylaxis    LOS: 1 day   Christell Faith 04/02/2015, 8:11 AM

## 2015-04-02 NOTE — Progress Notes (Signed)
Postop Day 1 Epidural. Has been off mostly due to Cv instability. Remains on Neo gtt for support. Is more uncomfortable at this time but will need better CV stability before restart. For now , will leave in place and follow-up in am . Talked c nurses regarding restart if BP stable.

## 2015-04-02 NOTE — Consult Note (Signed)
WOC ostomy follow up Stoma type/location: RMQ urostomy (ileal conduit). Patient has been leaking constantly since arrival to unit last pm.  Stoma close to midline incision, in a circumferential gulley and with os tipped to 12 o'clock.  Two stents intact, Red/Blue. Stomal assessment/size: 7/8 inches round, edematous, red, moist Peristomal assessment: intact, macerated Treatment options for stomal/peristomal skin: skin barrier ring, convex pouching system Output: amber to yellow urine Ostomy pouching: 1pc.convex pouching system with skin barrier ring.  Education provided: patient is reassured that we will work together to find a pouching system that will keep him clean and dry. Enrolled patient in [place] Start Discharge program: No WOC nursing team will follow, and will remain available to this patient, the nursing, surgical and medical teams.   Thanks, Maudie Flakes, MSN, RN, [place], [place], [place] [phone])

## 2015-04-02 NOTE — Care Management Note (Signed)
Case Management Note  Patient Details  Name: Howard Valentine MRN: [id number] Date of Birth: 28-Jan-1956  Subjective/Objective:          Hypotension and svt post op           Action/Plan:Date:  Sept. 13, 2016 U.R. performed for needs and level of care. Will continue to follow for Case Management needs.  Velva Harman, RN, BSN, [place]   [phone]  Expected Discharge Date:                  Expected Discharge Plan:  Home/Self Care  In-House Referral:  NA  Discharge planning Services  CM Consult  Post Acute Care Choice:  NA Choice offered to:  NA  DME Arranged:    DME Agency:     HH Arranged:    HH Agency:     Status of Service:  In process, will continue to follow  Medicare Important Message Given:    Date Medicare IM Given:    Medicare IM give by:    Date Additional Medicare IM Given:    Additional Medicare Important Message give by:     If discussed at [place] of Stay Meetings, dates discussed:    Additional Comments:  Leeroy Cha, RN 04/02/2015, 10:50 AM

## 2015-04-03 DIAGNOSIS — D6489 Other specified anemias: Secondary | ICD-10-CM

## 2015-04-03 DIAGNOSIS — R579 Shock, unspecified: Secondary | ICD-10-CM

## 2015-04-03 LAB — CBC
HEMATOCRIT: 19.1 % — AB (ref 39.0–52.0)
HEMOGLOBIN: 6.5 g/dL — AB (ref 13.0–17.0)
MCH: 31.4 pg (ref 26.0–34.0)
MCHC: 34 g/dL (ref 30.0–36.0)
MCV: 92.3 fL (ref 78.0–100.0)
Platelets: 123 10*3/uL — ABNORMAL LOW (ref 150–400)
RBC: 2.07 MIL/uL — AB (ref 4.22–5.81)
RDW: 17 % — ABNORMAL HIGH (ref 11.5–15.5)
WBC: 12.1 10*3/uL — ABNORMAL HIGH (ref 4.0–10.5)

## 2015-04-03 LAB — GLUCOSE, CAPILLARY
GLUCOSE-CAPILLARY: 104 mg/dL — AB (ref 65–99)
GLUCOSE-CAPILLARY: 108 mg/dL — AB (ref 65–99)
GLUCOSE-CAPILLARY: 120 mg/dL — AB (ref 65–99)
GLUCOSE-CAPILLARY: 122 mg/dL — AB (ref 65–99)
Glucose-Capillary: 122 mg/dL — ABNORMAL HIGH (ref 65–99)
Glucose-Capillary: 109 mg/dL — ABNORMAL HIGH (ref 65–99)
Glucose-Capillary: 90 mg/dL (ref 65–99)

## 2015-04-03 LAB — BASIC METABOLIC PANEL
Anion gap: 5 (ref 5–15)
BUN: 8 mg/dL (ref 6–20)
CHLORIDE: 102 mmol/L (ref 101–111)
CO2: 26 mmol/L (ref 22–32)
Calcium: 7 mg/dL — ABNORMAL LOW (ref 8.9–10.3)
Creatinine, Ser: 0.84 mg/dL (ref 0.61–1.24)
GFR calc Af Amer: >60 mL/min (ref >60)
GFR calc non Af Amer: >60 mL/min (ref >60)
GLUCOSE: 118 mg/dL — AB (ref 65–99)
POTASSIUM: 3.7 mmol/L (ref 3.5–5.1)
Sodium: 133 mmol/L — ABNORMAL LOW (ref 135–145)

## 2015-04-03 LAB — PROCALCITONIN: PROCALCITONIN: 0.31 ng/mL

## 2015-04-03 LAB — PREPARE RBC (CROSSMATCH)

## 2015-04-03 MED ORDER — FUROSEMIDE 10 MG/ML IJ SOLN
20.0000 mg | Freq: Once | INTRAMUSCULAR | Status: AC
  Administered 2015-04-03: 20 mg via INTRAVENOUS
  Filled 2015-04-03: qty 2

## 2015-04-03 MED ORDER — SENNA 8.6 MG PO TABS
2.0000 | ORAL_TABLET | Freq: Every day | ORAL | Status: DC
  Administered 2015-04-03: 17.2 mg via ORAL
  Filled 2015-04-03 (×3): qty 2

## 2015-04-03 MED ORDER — DOCUSATE SODIUM 100 MG PO CAPS
100.0000 mg | ORAL_CAPSULE | Freq: Two times a day (BID) | ORAL | Status: DC
Start: 2015-04-03 — End: 2015-04-05
  Administered 2015-04-03 – 2015-04-04 (×3): 100 mg via ORAL
  Filled 2015-04-03 (×4): qty 1

## 2015-04-03 MED ORDER — ACETAMINOPHEN 10 MG/ML IV SOLN
1000.0000 mg | Freq: Four times a day (QID) | INTRAVENOUS | Status: AC
  Administered 2015-04-03 – 2015-04-04 (×3): 1000 mg via INTRAVENOUS
  Filled 2015-04-03 (×7): qty 100

## 2015-04-03 MED ORDER — INFLUENZA VAC SPLIT QUAD 0.5 ML IM SUSY
0.5000 mL | PREFILLED_SYRINGE | INTRAMUSCULAR | Status: AC
  Administered 2015-04-06: 0.5 mL via INTRAMUSCULAR
  Filled 2015-04-03: qty 0.5

## 2015-04-03 MED ORDER — HYDROMORPHONE 0.3 MG/ML IV SOLN
INTRAVENOUS | Status: DC
  Administered 2015-04-03: 12:00:00 via INTRAVENOUS
  Filled 2015-04-03: qty 25

## 2015-04-03 MED ORDER — DEXTROSE-NACL 5-0.45 % IV SOLN
INTRAVENOUS | Status: DC
  Administered 2015-04-03: 09:00:00 via INTRAVENOUS

## 2015-04-03 MED ORDER — SODIUM CHLORIDE 0.9 % IV SOLN
Freq: Once | INTRAVENOUS | Status: AC
  Administered 2015-04-03: 07:00:00 via INTRAVENOUS

## 2015-04-03 MED ORDER — LACTATED RINGERS IV SOLN
INTRAVENOUS | Status: DC
  Administered 2015-04-03: 25 mL/h via INTRAVENOUS

## 2015-04-03 NOTE — Progress Notes (Signed)
2 Days Post-Op Subjective: Improved during the day, weaned pressors, decreased IV fluids, afebrile, OOBTC, tolerating small volume clears, pain controlled with PCA, BP stable but low, good UOP.  Objective: Vital signs in last 24 hours: Temp:  [97.5 F (36.4 C)-99.4 F (37.4 C)] 97.7 F (36.5 C) (09/14 0645) Pulse Rate:  [79-116] 102 (09/14 0645) Resp:  [9-22] 11 (09/14 0645) BP: (75-124)/(37-70) 88/43 mmHg (09/14 0645) SpO2:  [94 %-100 %] 100 % (09/14 0645) Weight:  [67.5 kg (148 lb 13 oz)] 67.5 kg (148 lb 13 oz) (09/14 0400)  Intake/Output from previous day: 09/13 0701 - 09/14 0700 In: 4391.4 [I.V.:3362.4; Blood:330; IV Piggyback:675] Out: 1950 [Urine:1580; Drains:370] Intake/Output this shift:    Physical Exam:  General: Alert and oriented CV: RRR Lungs: Clear Abdomen: Soft, ND, incision c/d/i, ostomy P/P/P Incisions: Ext: NT, No erythema  Lab Results:  Recent Labs  04/02/15 0348 04/02/15 1450 04/03/15 0334  HGB 7.7* 7.3* 6.5*  HCT 22.8* 20.8* 19.1*   BMET  Recent Labs  04/02/15 0348 04/03/15 0334  NA 133* 133*  K 4.1 3.7  CL 106 102  CO2 21* 26  GLUCOSE 190* 118*  BUN 8 8  CREATININE 1.01 0.84  CALCIUM 6.7* 7.0*     Studies/Results: No results found.   Assessment/Plan: 59 y.o. male s/p open radical cystectomy and ileal conduit on 04/01/15.  - Restart epidural, decrease PCA, monitor for hypotension - IV tylenol - D5 1/2 NS at 75 - Clears - Continue Entereg - Bowel regimen - Hgb trend down, likely dilutional to 6.5, will give 1 unit PRBC - WBC down, afebrile, stop all antibiotics - Making good UOP - 20 mg IV lasix for fluid status - Wound ostomy consulted - SCDs, IC, and heparin prophylaxis    LOS: 2 days   Christell Faith 04/03/2015, 7:15 AM

## 2015-04-03 NOTE — Progress Notes (Signed)
   Name: BAIRD POLINSKI MRN: [id number] DOB: 02-Jun-1956    ADMISSION DATE:  04/01/2015 CONSULTATION DATE:  04/02/2015  REFERRING MD :  Urology Service  CHIEF COMPLAINT:  Septic Shock  BRIEF PATIENT DESCRIPTION: 59 year old male with high-grade invasive bladder cancer. Underwent cystectomy with ileal conduit on 9/13. Patient had postoperative hypotension in the PACU.  SIGNIFICANT EVENTS  9/12 - Admit to [facility] 9/12 - Cystectomy & ileal conduit 9/12 - Transfer to ICU for shock from PACU  STUDIES:   SUBJECTIVE: No acute events overnight. Patient did receive 1 unit of packed red blood cells in transfusion for anemia this morning. No signs of active bleeding. Denies any chest pain or pressure. Denies any dyspnea with only a mild intermittent cough. Per the nurse the patient is currently ordered to transition to a floor bed.  ROS: Denies any subjective fever, chills, or sweats. Denies any headache or vision changes.  VITAL SIGNS: Temp:  [97.5 F (36.4 C)-99.4 F (37.4 C)] 98.1 F (36.7 C) (09/14 0821) Pulse Rate:  [79-116] 93 (09/14 0821) Resp:  [9-22] 13 (09/14 0821) BP: (75-110)/(37-70) 96/52 mmHg (09/14 0821) SpO2:  [94 %-100 %] 99 % (09/14 0821) Weight:  [67.5 kg (148 lb 13 oz)] 67.5 kg (148 lb 13 oz) (09/14 0400)  PHYSICAL EXAMINATION: General:  Awake. Alert. Wife at bedside. Sitting up in bed. Integument:  No cyanosis. Warm & dry. No bruising. HEENT: Moist mucus membranes. No oral ulcers.  Cardiovascular:  Regular rate. No edema.   Pulmonary:  Good aeration & clear to auscultation bilaterally. Symmetric chest wall expansion. Abdomen: Soft. Hypoactive bowel sounds. Abdominal dressing in place clean & dry.  Neurological:  CN 2-12 grossly in tact. Moving all 4 extremities equally. Oriented to person, place, & time.   Recent Labs Lab 04/01/15 2120 04/02/15 0348 04/03/15 0334  NA 135 133* 133*  K 4.7 4.1 3.7  CL 106 106 102  CO2 21* 21* 26  BUN 10 8 8   CREATININE 1.05  1.01 0.84  GLUCOSE 251* 190* 118*    Recent Labs Lab 04/01/15 2120 04/02/15 0348 04/02/15 1450 04/03/15 0334  HGB 8.3* 7.7* 7.3* 6.5*  HCT 24.3* 22.8* 20.8* 19.1*  WBC 23.4* 20.5*  --  12.1*  PLT 135* 144*  --  123*   No results found.  ASSESSMENT / PLAN: 59 year old male status post cystectomy with ileal conduit for high-grade bladder cancer. Patient has weaned off of vasopressor support. Status post 1 unit packed red blood cells. Patient ordered to transition to a floor bed. Respiratory status appears stable. Urine output is improving as well.   1. Possible Septic shock: Shock resolved. Possible urologic/intra-abdominal source. Cultures pending. Continuing on Rocephin. 2. Lactic acidosis: Resolved. 3. Anemia: No signs of active bleeding. Likely due to dilution. Status post 1 unit of packed red blood cells.  4. Thrombocytopenia: Likely dilutional. Stable. 5. Postoperative Pain: Management per primary service. 6. Prophylaxis:  SCDs & Heparin [place] q8hr. 7. Hyperkalemia:  Resolved.  At this time I will sign off. Please let me know if we can be of any further assistance in the care of this patient.   Sonia Baller Ashok Cordia, M.D. [facility] Pulmonary [facility] Critical [facility] Pager:  [phone] After 3pm or if no response, call [phone] 04/03/2015, 10:17 AM

## 2015-04-03 NOTE — Consult Note (Signed)
WOC ostomy follow up Stoma type/location: RMQ ileal conduit (urostomy) with two stents intact. Patient has been dry/pouch intact since last pouch change (1:30pm 04/02/15) Stomal assessment/size: Seen through pouch today Peristomal assessment: Not seen today Treatment options for stomal/peristomal skin: Skin barrier ring, convex pouching system cut off-center. Ostomy belt Output dark gold urine Ostomy pouching: 1pc.convex urostomy pouching system with skin barrier ring  Education provided: Patient educational booklet and one piece teaching guide left at bedside along with pouching supplies. Enrolled patient in [place] Start Discharge program: Yes.  A urostomy pouching system with a smaller diameter than those we have in house is requested (1-inch, cut-to-fit convexity) along with CeraPlus (ceramide infused) skin barrier rings (4 of each). Smaller convexity is required to place convex protrusion closer to the 7/8 inch stoma than the in-house 1 and 1/2 inch product can provide. [place] nursing team will follow, and will remain available to this patient, the nursing, surgical and medical teams.   Thanks, Maudie Flakes, MSN, RN, [place], [place], [place] [phone])

## 2015-04-03 NOTE — Progress Notes (Signed)
CRITICAL VALUE ALERT  Critical value received:  Hgb 6.5  Date of notification:  04/03/15  Time of notification:  0410  Critical value read back:Yes.    Nurse who received alert:  Leola Brazil, RN  MD notified (1st page):  Dr. Louis Meckel  Time of first page:  0420  MD notified (2nd page):  Time of second page:  Responding MD:  Dr. Louis Meckel  Time MD responded:  [phone]

## 2015-04-03 NOTE — Discharge Summary (Signed)
Date of admission: 04/01/2015  Date of discharge: 04/03/2015  Admission diagnosis: Bladder Cancer  Discharge diagnosis: Same  Secondary diagnoses: Acute blood loss anemia requiring transfusion. Hypotension related to surgery and volume depletion requiring pressor support, transient hyperglycemia, hypokalemia.   History and Physical: For full details, please see admission history and physical. Briefly, Howard Valentine is a 59 y.o. year old patient with with T2 bladder cancer here for radical cystectomy and ileal conduit.   Hospital Course: The patient had an open radical cystectomy with ileal conduit on 04/01/15, an epidural was placed for pain control. The patient tolerated the procedure well and required 2 units PRBC intraoperatively due to significant adhesions from previous RALP. The patient was transferred to the SICU post operatively and started on a phenylephrine drip for hypotension. The pressor was weaned over the next 24 hours and he was started on a clear liquid diet on POD#1. He was transferred to the floor on POD#2 and did well on clears. The patient's diet was slowly advanced. He had flatus on POD#3 and a BM on POD#3. PT saw and evaluated the patient, the patient progressed with ambulation from needing a walker to ambulating alone and no PT needs were anticipated. Ostomy worked with the patient to establish good ostomy care and the patient will go home with home health for continued ostomy teaching. The patient's epidural was removed on POD#4 and he was transitioned to oral pain medication. The patient met all milestones for discharge on 6 Days Post-Op. The patient will follow up in 10-14 days from surgery to remove his stents and staples.   Laboratory values:  Recent Labs  04/02/15 0348 04/02/15 1450 04/03/15 0334  HGB 7.7* 7.3* 6.5*  HCT 22.8* 20.8* 19.1*    Recent Labs  04/02/15 0348 04/03/15 0334  CREATININE 1.01 0.84    Disposition: Home  Discharge instruction: The  patient was instructed to be ambulatory but told to refrain from heavy lifting, strenuous activity, or driving.   1.  Activity:  You are encouraged to ambulate frequently (about every hour during waking hours) to help prevent blood clots from forming in your legs or lungs.  However, you should not engage in any heavy lifting (> 10-15 lbs), strenuous activity, or straining. 2. Diet: You should advance your diet as instructed by your physician.  It will be normal to have some bloating, nausea, and abdominal discomfort intermittently. 3. Prescriptions:  You will be provided a prescription for pain medication to take as needed.  If your pain is not severe enough to require the prescription pain medication, you may take extra strength Tylenol instead which will have less side effects.  You should also take a prescribed stool softener to avoid straining with bowel movements as the prescription pain medication may constipate you. 4. Incisions: You may remove your dressing bandages 48 hours after surgery if not removed in the hospital.  You will either have some small staples or special tissue glue at each of the incision sites. Once the bandages are removed (if present), the incisions may stay open to air.  You may start showering (but not soaking or bathing in water) the 2nd day after surgery and the incisions simply need to be patted dry after the shower.  No additional care is needed. 5. What to call us about: You should call the office [phone]) if you develop fever > 101 or develop persistent vomiting.  Discharge medications:    Medication List    ASK your doctor about  these medications        acetaminophen 500 MG tablet  Commonly known as:  TYLENOL  Take 500-1,000 mg by mouth every 6 (six) hours as needed for moderate pain.     calcium carbonate 750 MG chewable tablet  Commonly known as:  TUMS EX  Chew 1 tablet by mouth daily.     loratadine 10 MG tablet  Commonly known as:  CLARITIN   Take 10 mg by mouth daily as needed for allergies.     phenazopyridine 200 MG tablet  Commonly known as:  PYRIDIUM  Take 1 tablet (200 mg total) by mouth 3 (three) times daily as needed for pain.        Followup:  10-14 days after surgery for staples and stents out

## 2015-04-03 NOTE — Progress Notes (Signed)
eLink Physician-Brief Progress Note Patient Name: Howard Valentine DOB: February 06, 1956 MRN: [id number]   Date of Service  04/03/2015  HPI/Events of Note  Hgb decline from 7.3 to 6.5  eICU Interventions  Plan: Transfuse 1 unit pRBC Post-transfusion CBC     Intervention Category Intermediate Interventions: Bleeding - evaluation and treatment with blood products  DETERDING,ELIZABETH 04/03/2015, 4:20 AM

## 2015-04-04 LAB — CBC
HEMATOCRIT: 22.3 % — AB (ref 39.0–52.0)
Hemoglobin: 7.6 g/dL — ABNORMAL LOW (ref 13.0–17.0)
MCH: 31.1 pg (ref 26.0–34.0)
MCHC: 34.1 g/dL (ref 30.0–36.0)
MCV: 91.4 fL (ref 78.0–100.0)
Platelets: 144 10*3/uL — ABNORMAL LOW (ref 150–400)
RBC: 2.44 MIL/uL — AB (ref 4.22–5.81)
RDW: 15.8 % — ABNORMAL HIGH (ref 11.5–15.5)
WBC: 12.8 10*3/uL — AB (ref 4.0–10.5)

## 2015-04-04 LAB — GLUCOSE, CAPILLARY
GLUCOSE-CAPILLARY: 98 mg/dL (ref 65–99)
GLUCOSE-CAPILLARY: 103 mg/dL — AB (ref 65–99)
GLUCOSE-CAPILLARY: 89 mg/dL (ref 65–99)
Glucose-Capillary: 95 mg/dL (ref 65–99)
Glucose-Capillary: 96 mg/dL (ref 65–99)
Glucose-Capillary: 97 mg/dL (ref 65–99)

## 2015-04-04 LAB — BASIC METABOLIC PANEL
ANION GAP: 9 (ref 5–15)
BUN: 5 mg/dL — ABNORMAL LOW (ref 6–20)
CO2: 25 mmol/L (ref 22–32)
Calcium: 7.4 mg/dL — ABNORMAL LOW (ref 8.9–10.3)
Chloride: 100 mmol/L — ABNORMAL LOW (ref 101–111)
Creatinine, Ser: 0.81 mg/dL (ref 0.61–1.24)
GLUCOSE: 97 mg/dL (ref 65–99)
POTASSIUM: 3.1 mmol/L — AB (ref 3.5–5.1)
Sodium: 134 mmol/L — ABNORMAL LOW (ref 135–145)

## 2015-04-04 MED ORDER — FUROSEMIDE 10 MG/ML IJ SOLN
20.0000 mg | Freq: Once | INTRAMUSCULAR | Status: AC
  Administered 2015-04-04: 20 mg via INTRAVENOUS
  Filled 2015-04-04: qty 2

## 2015-04-04 MED ORDER — POTASSIUM CHLORIDE 10 MEQ/100ML IV SOLN
INTRAVENOUS | Status: AC
  Administered 2015-04-04: 10 meq via INTRAVENOUS
  Filled 2015-04-04: qty 100

## 2015-04-04 MED ORDER — POTASSIUM CHLORIDE 10 MEQ/100ML IV SOLN
10.0000 meq | INTRAVENOUS | Status: AC
  Administered 2015-04-04 (×4): 10 meq via INTRAVENOUS
  Filled 2015-04-04 (×2): qty 100

## 2015-04-04 MED ORDER — PANTOPRAZOLE SODIUM 40 MG PO TBEC
40.0000 mg | DELAYED_RELEASE_TABLET | Freq: Every day | ORAL | Status: DC
  Filled 2015-04-04: qty 1

## 2015-04-04 NOTE — Evaluation (Signed)
Physical Therapy Evaluation Patient Details Name: Howard Valentine MRN: [id number] DOB: 1955-08-30 Today's Date: 04/04/2015   History of Present Illness  59 y.o. male s/p open radical cystectomy and ileal conduit on 04/01/15 due to bladder cancer.  Clinical Impression  Pt admitted with above diagnosis. Pt currently with functional limitations due to the deficits listed below (see PT Problem List).  Pt will benefit from skilled PT to increase their independence and safety with mobility to allow discharge to the venue listed below.  Pt assisted to [facility] for BM and then ambulated in hallway good distance.       Follow Up Recommendations Home health PT (may progress to no needs)    Equipment Recommendations  Rolling walker with 5" wheels    Recommendations for Other Services       Precautions / Restrictions Precautions Precautions: Fall Precaution Comments: epidural, JP drain, urostomy Restrictions Weight Bearing Restrictions: No      Mobility  Bed Mobility               General bed mobility comments: pt up in recliner on arrival  Transfers Overall transfer level: Needs assistance Equipment used: None Transfers: Sit to/from [facility] Sit to Stand: Min guard Stand pivot transfers: Min guard       General transfer comment: verbal cues for hand placement  Ambulation/Gait Ambulation/Gait assistance: Min guard Ambulation Distance (Feet): 120 Feet Assistive device: Rolling walker (2 wheeled) Gait Pattern/deviations: Step-through pattern;Decreased stride length;Trunk flexed     General Gait Details: verbal cues for posture, tends to remain in trunk flexion  Stairs            Wheelchair Mobility    Modified Rankin (Stroke Patients Only)       Balance                                             Pertinent Vitals/Pain Pain Assessment: 0-10 Pain Score: 5  Pain Location: urostomy site Pain Descriptors / Indicators:  Sore Pain Intervention(s): Limited activity within patient's tolerance;Monitored during session;Repositioned;Other (comment) (epidural)    Home Living Family/patient expects to be discharged to:: Private residence Living Arrangements: Spouse/significant other Available Help at Discharge: Family;Available 24 hours/day Type of Home: House       Home Layout: Able to live on main level with bedroom/bathroom Home Equipment: None      Prior Function Level of Independence: Independent               Hand Dominance        Extremity/Trunk Assessment               Lower Extremity Assessment: Generalized weakness         Communication   Communication: No difficulties  Cognition Arousal/Alertness: Awake/alert Behavior During Therapy: WFL for tasks assessed/performed Overall Cognitive Status: Within Functional Limits for tasks assessed                      General Comments      Exercises        Assessment/Plan    PT Assessment Patient needs continued PT services  PT Diagnosis Difficulty walking   PT Problem List Decreased strength;Decreased activity tolerance;Decreased mobility;Decreased knowledge of use of DME;Pain  PT Treatment Interventions DME instruction;Gait training;Functional mobility training;Patient/family education;Therapeutic activities;Therapeutic exercise;Stair training   PT Goals (Current goals can be  found in the Care Plan section) Acute Rehab PT Goals PT Goal Formulation: With patient Time For Goal Achievement: 04/18/15 Potential to Achieve Goals: Good    Frequency Min 3X/week   Barriers to discharge        Co-evaluation               End of Session   Activity Tolerance: Patient tolerated treatment well Patient left: in chair;with call bell/phone within reach Nurse Communication: Mobility status         Time: 0922-0952 PT Time Calculation (min) (ACUTE ONLY): 30 min   Charges:   PT Evaluation $Initial PT  Evaluation Tier I: 1 Procedure     PT G Codes:        Brandun Pinn,KATHrine E 04/04/2015, 11:05 AM Carmelia Bake, PT, DPT 04/04/2015 Pager: [phone]

## 2015-04-04 NOTE — Progress Notes (Signed)
3 Days Post-Op Subjective: Transferred to floor Transfused one unit yesterday Epidural restarted No complaints, no BM or flatus  Objective: Vital signs in last 24 hours: Temp:  [98.1 F (36.7 C)-98.4 F (36.9 C)] 98.3 F (36.8 C) (09/15 0402) Pulse Rate:  [90-101] 101 (09/15 0402) Resp:  [10-22] 18 (09/15 0402) BP: (90-103)/(50-58) 99/52 mmHg (09/15 0402) SpO2:  [97 %-100 %] 99 % (09/15 0402)  Intake/Output from previous day: 09/14 0701 - 09/15 0700 In: 1352.5 [I.V.:557.5; IV Piggyback:325] Out: 2050 [Urine:2050] Intake/Output this shift:  Intake/Output Summary (Last 24 hours) at 04/04/15 0945 Last data filed at 04/04/15 0847  Gross per 24 hour  Intake 1317.5 ml  Output   2835 ml  Net -1517.5 ml  JP 60 p MN  Physical Exam:  General: Alert and oriented Abdomen: Soft, ND, incision c/d/i, ostomy P/P/P Incisions: Ext: NT, No erythema  Lab Results:  Recent Labs  04/02/15 1450 04/03/15 0334 04/04/15 0504  HGB 7.3* 6.5* 7.6*  HCT 20.8* 19.1* 22.3*   BMET  Recent Labs  04/03/15 0334 04/04/15 0504  NA 133* 134*  K 3.7 3.1*  CL 102 100*  CO2 26 25  GLUCOSE 118* 97  BUN 8 5*  CREATININE 0.84 0.81  CALCIUM 7.0* 7.4*     Studies/Results: No results found.   Assessment/Plan: 59 y.o. male s/p open radical cystectomy and ileal conduit on 04/01/15.  Doing well.  Minimize IVF Encourage PO intake PT/OT - encourage ambulation Continue w epidural, consider removing tomorrow Continue entereg SQH - hold AM dose    LOS: 3 days   Louis Meckel W 04/04/2015, 9:43 AM

## 2015-04-04 NOTE — Progress Notes (Signed)
Pharmacy IV to PO conversion  The patient is receiving pantoprazole by the intravenous route.  Based on criteria approved by the Pharmacy and [place], the medication is being converted to the equivalent oral dose form.   No active GI bleeding or impaired absorption  Not s/p esophagectomy  Documented ability to take oral medications for > 24 hr  Plan to continue treatment for at least 1 day  If you have any questions about this conversion, please contact the Pharmacy Department (ext [phone]).  Thank you.  Reuel Boom, PharmD Pager: [phone] 04/04/2015, 3:50 PM

## 2015-04-04 NOTE — Progress Notes (Signed)
Day 3 of epidural pain control.  Doing extremely well with no narcotic use and very comfortable.  Dressing intact and site looks good.  Will d/c catheter tomorrow.  Candelario Steppe MD

## 2015-04-04 NOTE — Addendum Note (Signed)
Addendum  created 04/04/15 1407 by Rod Mae, MD   Modules edited: Clinical Notes   Clinical Notes:  File: [id number]

## 2015-04-05 LAB — TYPE AND SCREEN
ABO/RH(D): A POS
Antibody Screen: NEGATIVE
UNIT DIVISION: 0
Unit division: 0
Unit division: 0
Unit division: 0

## 2015-04-05 LAB — CBC
HCT: 23.4 % — ABNORMAL LOW (ref 39.0–52.0)
HEMOGLOBIN: 7.9 g/dL — AB (ref 13.0–17.0)
MCH: 31.7 pg (ref 26.0–34.0)
MCHC: 33.8 g/dL (ref 30.0–36.0)
MCV: 94 fL (ref 78.0–100.0)
Platelets: 203 10*3/uL (ref 150–400)
RBC: 2.49 MIL/uL — ABNORMAL LOW (ref 4.22–5.81)
RDW: 15.7 % — ABNORMAL HIGH (ref 11.5–15.5)
WBC: 13.7 10*3/uL — ABNORMAL HIGH (ref 4.0–10.5)

## 2015-04-05 LAB — BASIC METABOLIC PANEL
Anion gap: 7 (ref 5–15)
BUN: 7 mg/dL (ref 6–20)
CHLORIDE: 102 mmol/L (ref 101–111)
CO2: 27 mmol/L (ref 22–32)
CREATININE: 0.82 mg/dL (ref 0.61–1.24)
Calcium: 7.6 mg/dL — ABNORMAL LOW (ref 8.9–10.3)
GFR calc Af Amer: >60 mL/min (ref >60)
GFR calc non Af Amer: >60 mL/min (ref >60)
GLUCOSE: 100 mg/dL — AB (ref 65–99)
Potassium: 3.2 mmol/L — ABNORMAL LOW (ref 3.5–5.1)
SODIUM: 136 mmol/L (ref 135–145)

## 2015-04-05 MED ORDER — POTASSIUM CHLORIDE 10 MEQ/100ML IV SOLN
10.0000 meq | INTRAVENOUS | Status: AC
  Administered 2015-04-05 (×5): 10 meq via INTRAVENOUS
  Filled 2015-04-05 (×6): qty 100

## 2015-04-05 MED ORDER — DOCUSATE SODIUM 50 MG/5ML PO LIQD: 200.0000 mg | Freq: Two times a day (BID) | ORAL | Status: DC

## 2015-04-05 MED ORDER — OXYCODONE HCL 5 MG/5ML PO SOLN: 5.0000 mg | ORAL | Status: DC | PRN

## 2015-04-05 MED ORDER — OXYCODONE HCL 5 MG/5ML PO SOLN
5.0000 mg | ORAL | Status: DC | PRN
  Administered 2015-04-06 – 2015-04-07 (×5): 10 mg via ORAL
  Filled 2015-04-05 (×5): qty 10

## 2015-04-05 MED ORDER — KETOROLAC TROMETHAMINE 15 MG/ML IJ SOLN
15.0000 mg | Freq: Four times a day (QID) | INTRAMUSCULAR | Status: DC
  Administered 2015-04-05 – 2015-04-06 (×5): 15 mg via INTRAVENOUS
  Filled 2015-04-05 (×5): qty 1

## 2015-04-05 MED ORDER — DOCUSATE SODIUM 100 MG PO CAPS
200.0000 mg | ORAL_CAPSULE | Freq: Two times a day (BID) | ORAL | Status: DC
  Filled 2015-04-05: qty 2

## 2015-04-05 MED ORDER — POLYETHYLENE GLYCOL 3350 17 G PO PACK
17.0000 g | PACK | Freq: Every day | ORAL | Status: DC
  Administered 2015-04-05 – 2015-04-07 (×3): 17 g via ORAL
  Filled 2015-04-05 (×3): qty 1

## 2015-04-05 MED ORDER — POLYETHYLENE GLYCOL 3350 17 G PO PACK: 17.0000 g | PACK | Freq: Every day | ORAL | Status: DC

## 2015-04-05 MED ORDER — KETOROLAC TROMETHAMINE 15 MG/ML IJ SOLN: 15.0000 mg | Freq: Four times a day (QID) | INTRAMUSCULAR | Status: DC | PRN

## 2015-04-05 MED ORDER — ONDANSETRON HCL 4 MG/5ML PO SOLN: 4.0000 mg | Freq: Once | ORAL | Status: AC

## 2015-04-05 MED ORDER — ACETAMINOPHEN 10 MG/ML IV SOLN
1000.0000 mg | Freq: Four times a day (QID) | INTRAVENOUS | Status: AC
  Administered 2015-04-05 – 2015-04-06 (×4): 1000 mg via INTRAVENOUS
  Filled 2015-04-05 (×7): qty 100

## 2015-04-05 MED ORDER — POLYETHYLENE GLYCOL 3350 17 G PO PACK: 17.0000 g | PACK | Freq: Every day | ORAL | Status: AC

## 2015-04-05 NOTE — Progress Notes (Signed)
Physical Therapy Treatment Patient Details Name: Howard Valentine MRN: [id number] DOB: 10-18-1955 Today's Date: 17-Apr-2015    History of Present Illness 59 y.o. male s/p open radical cystectomy and ileal conduit on 04/01/15 due to bladder cancer.    PT Comments    Pt able to tolerate ambulating around unit today and plans to ambulate with nursing a few more times today.  Pt progressing well and no f/u PT needs anticipated.  Pt did have questions regarding further urostomy education so RN notified.   Follow Up Recommendations  No PT follow up     Equipment Recommendations  Rolling walker with 5" wheels    Recommendations for Other Services       Precautions / Restrictions Precautions Precautions: Fall Precaution Comments:  JP drain, urostomy    Mobility  Bed Mobility               General bed mobility comments: pt up in recliner on arrival  Transfers Overall transfer level: Needs assistance Equipment used: None Transfers: Sit to/from Stand Sit to Stand: Min guard         General transfer comment: verbal cues for hand placement  Ambulation/Gait Ambulation/Gait assistance: Min guard Ambulation Distance (Feet): 400 Feet Assistive device: None Gait Pattern/deviations: Step-through pattern;Trunk flexed;Decreased stride length     General Gait Details: max verbal cues for posture, tends to remain in trunk flexion, pt pushed IV pole today   Stairs            Wheelchair Mobility    Modified Rankin (Stroke Patients Only)       Balance                                    Cognition Arousal/Alertness: Awake/alert Behavior During Therapy: WFL for tasks assessed/performed Overall Cognitive Status: Within Functional Limits for tasks assessed                      Exercises      General Comments        Pertinent Vitals/Pain Pain Assessment: 0-10 Pain Score: 2  Pain Location: urostomy site Pain Descriptors / Indicators:  Sore Pain Intervention(s): Limited activity within patient's tolerance;Monitored during session    Home Living                      Prior Function            PT Goals (current goals can now be found in the care plan section) Progress towards PT goals: Progressing toward goals    Frequency  Min 3X/week    PT Plan Current plan remains appropriate    Co-evaluation             End of Session   Activity Tolerance: Patient tolerated treatment well Patient left: in chair;with call bell/phone within reach     Time: 1607-3710 PT Time Calculation (min) (ACUTE ONLY): 12 min  Charges:  $Gait Training: 8-22 mins                    G Codes:      LEMYRE,KATHrine E April 17, 2015, 1:11 PM Carmelia Bake, PT, DPT 04-17-2015 Pager: [phone]

## 2015-04-05 NOTE — Progress Notes (Signed)
4 Days Post-Op Subjective: Doing well, pain controlled, started having BM/flatus on POD#3, tolerating regular diet, gagging/vomiting with Pills, minimal ambulation  Objective: Vital signs in last 24 hours: Temp:  [98.5 F (36.9 C)-99.6 F (37.6 C)] 98.5 F (36.9 C) (09/16 0418) Pulse Rate:  [91-105] 91 (09/16 0418) Resp:  [16-18] 18 (09/16 0418) BP: (109-120)/(55-63) 120/62 mmHg (09/16 0418) SpO2:  [100 %] 100 % (09/16 0418)  Intake/Output from previous day: 09/15 0701 - 09/16 0700 In: 400 [I.V.:400] Out: 1570 [Urine:1125; Drains:445] Intake/Output this shift:  Intake/Output Summary (Last 24 hours) at 04/05/15 1255 Last data filed at 04/05/15 0900  Gross per 24 hour  Intake    640 ml  Output    785 ml  Net   -145 ml  JP 60 p MN  Physical Exam:  General: Alert and oriented Abdomen: Soft, ND, incision c/d/i, ostomy P/P/P Incisions: c/d/i Ext: NT, No erythema  Lab Results:  Recent Labs  04/03/15 0334 04/04/15 0504 04/05/15 0447  HGB 6.5* 7.6* 7.9*  HCT 19.1* 22.3* 23.4*   BMET  Recent Labs  04/04/15 0504 04/05/15 0447  NA 134* 136  K 3.1* 3.2*  CL 100* 102  CO2 25 27  GLUCOSE 97 100*  BUN 5* 7  CREATININE 0.81 0.82  CALCIUM 7.4* 7.6*     Studies/Results: No results found.   Assessment/Plan: 59 y.o. male s/p open radical cystectomy and ileal conduit on 04/01/15.  Doing well.  Med lock Encourage PO intake PT/OT - encourage ambulation Epidural out today D/c entereg 2/2 having BMs SQH - hold AM dose    LOS: 4 days   Christell Faith 04/05/2015, 12:55 PM

## 2015-04-05 NOTE — Consult Note (Signed)
WOC ostomy follow up Stoma type/location: RMQ Ileal conduit, stents intact. Pouch is intact today, but will perform pouch change for teaching.  Patient indicates he may be discharged Saturday.  Stomal assessment/size: 7/8 inches.  Pink patent and stents intact, producing dark yellow urine.  Peristomal assessment: Intact.  Midline abdominal incision very close to stoma, barrier ring used to protect this area and promote seal.  Treatment options for stomal/peristomal skin: Barrier ring, 1 piece convex pouching system.  Output Dark yellow urine Ostomy pouching: 1pc. Convex with barrier ring Education provided: Patient performed pouch change today.  ABle to disconnect and reconnect to nighttime drainage bag.  Able to apply and remove ostomy belt.  Verbalizes rationale for both.  Patient removed pouch and is aware to protect stents during the procedure.  Peristomal skin is intact today. Pouch applied, spout opened to drainage and bedside drainage attached.  Verbalizes understanding not to use bedside drainage when up ambulating and to empty pouch when 1/3 full. Discussed need for [facility] for ongoing teaching and she will mention this at the huddle this AM.  Enrolled patient in [place] Start Discharge program: Yes [place] team will follow through discharge.  Supplies at bedside. Will order an additional bedside drainage bag.  Domenic Moras RN BSN [place] Pager [phone]

## 2015-04-05 NOTE — Discharge Instructions (Signed)

## 2015-04-05 NOTE — Addendum Note (Signed)
Addendum  created 04/05/15 0947 by Franne Grip, MD   Modules edited: Clinical Notes   Clinical Notes:  File: [id number]; File: [id number]

## 2015-04-05 NOTE — Progress Notes (Signed)
Wasted 59mL of Ropivocaine With [employer]  Cathie Hoops, RN

## 2015-04-05 NOTE — Progress Notes (Addendum)
S: Asked by surgery to remove epidural today. Pain is well controlled. Denies back pain. Was walking yesterday.  O: Platelet count 203K.  Last heparin dose was last night at 2045. Back non tender. Epidural in place. Site non red.  Epidural pulled. Tip intact. Band aid applied. Moving both legs appropriately.  A/P: Epidural pulled. Patient comfortable. Katie, RN aware. Next heparin dose in 6 hours.

## 2015-04-06 LAB — CBC
HCT: 24 % — ABNORMAL LOW (ref 39.0–52.0)
HEMOGLOBIN: 8.1 g/dL — AB (ref 13.0–17.0)
MCH: 31.3 pg (ref 26.0–34.0)
MCHC: 33.8 g/dL (ref 30.0–36.0)
MCV: 92.7 fL (ref 78.0–100.0)
Platelets: 257 10*3/uL (ref 150–400)
RBC: 2.59 MIL/uL — AB (ref 4.22–5.81)
RDW: 15.6 % — ABNORMAL HIGH (ref 11.5–15.5)
WBC: 13.9 10*3/uL — AB (ref 4.0–10.5)

## 2015-04-06 LAB — BASIC METABOLIC PANEL
ANION GAP: 5 (ref 5–15)
BUN: 7 mg/dL (ref 6–20)
CHLORIDE: 101 mmol/L (ref 101–111)
CO2: 28 mmol/L (ref 22–32)
CREATININE: 0.83 mg/dL (ref 0.61–1.24)
Calcium: 7.6 mg/dL — ABNORMAL LOW (ref 8.9–10.3)
GFR calc non Af Amer: >60 mL/min (ref >60)
Glucose, Bld: 99 mg/dL (ref 65–99)
POTASSIUM: 3.2 mmol/L — AB (ref 3.5–5.1)
SODIUM: 134 mmol/L — AB (ref 135–145)

## 2015-04-06 MED ORDER — ONDANSETRON 4 MG PO TBDP: 4.0000 mg | ORAL_TABLET | ORAL | Status: DC | PRN

## 2015-04-06 MED ORDER — DOCUSATE SODIUM 50 MG/5ML PO LIQD: 200.0000 mg | Freq: Two times a day (BID) | ORAL | Status: DC

## 2015-04-06 MED ORDER — DOCUSATE SODIUM 100 MG PO CAPS
200.0000 mg | ORAL_CAPSULE | Freq: Two times a day (BID) | ORAL | Status: DC
  Filled 2015-04-06 (×2): qty 2

## 2015-04-06 MED ORDER — POTASSIUM CHLORIDE 10 MEQ/100ML IV SOLN
10.0000 meq | INTRAVENOUS | Status: AC
  Administered 2015-04-06 (×5): 10 meq via INTRAVENOUS
  Filled 2015-04-06 (×6): qty 100

## 2015-04-06 NOTE — Consult Note (Signed)
WOC ostomy follow up Stoma type/location: RMQ, ileal conduit Output dark, yellow  Ostomy pouching: 1pc.convex placed yesterday with [place] nurse  Education provided: reviewed ostomy care, patient reports he knows how to hook to BSD, and that he cut the new pouch opening yesterday. We discussed HHRN needs, hoping to have [facility] RN at DC.  Plans for DC to home tom.  Enrolled patient in [place] Start Discharge program: Yes, previous per [facility]  WOC will follow along with you for continued support with ostomy teaching and care [place] RN,CWOCN [phone]

## 2015-04-06 NOTE — Progress Notes (Signed)
5 Days Post-Op Subjective: Doing well, pain controlled, started having BM/flatus, tolerating regular diet,  minimal ambulation  Objective: Vital signs in last 24 hours: Temp:  [98 F (36.7 C)-98.3 F (36.8 C)] 98.3 F (36.8 C) (09/17 0453) Pulse Rate:  [84-97] 84 (09/17 0453) Resp:  [18] 18 (09/17 0453) BP: (125-134)/(64-76) 125/70 mmHg (09/17 0453) SpO2:  [95 %-100 %] 100 % (09/17 0453)  Intake/Output from previous day: 09/16 0701 - 09/17 0700 In: 770 [P.O.:720; IV Piggyback:50] Out: 1300 [Urine:1200; Drains:100] Intake/Output this shift:  Intake/Output Summary (Last 24 hours) at 04/06/15 0837 Last data filed at 04/06/15 0823  Gross per 24 hour  Intake   1010 ml  Output   1300 ml  Net   -290 ml  JP 60 p MN  Physical Exam:  General: Alert and oriented Abdomen: Soft, ND, incision c/d/i, ostomy P/P/P Incisions: c/d/i Ext: NT, No erythema  Lab Results:  Recent Labs  04/04/15 0504 04/05/15 0447 04/06/15 0500  HGB 7.6* 7.9* 8.1*  HCT 22.3* 23.4* 24.0*   BMET  Recent Labs  04/05/15 0447 04/06/15 0550  NA 136 134*  K 3.2* 3.2*  CL 102 101  CO2 27 28  GLUCOSE 100* 99  BUN 7 7  CREATININE 0.82 0.83  CALCIUM 7.6* 7.6*     Studies/Results: No results found.   Assessment/Plan: 59 y.o. male s/p open radical cystectomy and ileal conduit on 04/01/15.  Doing well.  Med lock Encourage PO intake PT/OT - encourage ambulation Limit pill medications We will stop all IV medications, except K replacement and see how the patient does. If he does well today, possible discharge  SQH     LOS: 5 days   Christell Faith 04/06/2015, 8:37 AM

## 2015-04-07 LAB — BASIC METABOLIC PANEL
ANION GAP: 8 (ref 5–15)
BUN: 7 mg/dL (ref 6–20)
CHLORIDE: 101 mmol/L (ref 101–111)
CO2: 24 mmol/L (ref 22–32)
Calcium: 7.7 mg/dL — ABNORMAL LOW (ref 8.9–10.3)
Creatinine, Ser: 0.78 mg/dL (ref 0.61–1.24)
GFR calc Af Amer: >60 mL/min (ref >60)
GLUCOSE: 102 mg/dL — AB (ref 65–99)
POTASSIUM: 3.8 mmol/L (ref 3.5–5.1)
Sodium: 133 mmol/L — ABNORMAL LOW (ref 135–145)

## 2015-04-07 LAB — CULTURE, BLOOD (ROUTINE X 2)
CULTURE: NO GROWTH
Culture: NO GROWTH

## 2015-04-07 LAB — CBC
HEMATOCRIT: 23.7 % — AB (ref 39.0–52.0)
HEMOGLOBIN: 8 g/dL — AB (ref 13.0–17.0)
MCH: 31.5 pg (ref 26.0–34.0)
MCHC: 33.8 g/dL (ref 30.0–36.0)
MCV: 93.3 fL (ref 78.0–100.0)
PLATELETS: 287 10*3/uL (ref 150–400)
RBC: 2.54 MIL/uL — AB (ref 4.22–5.81)
RDW: 15.6 % — ABNORMAL HIGH (ref 11.5–15.5)
WBC: 13.8 10*3/uL — AB (ref 4.0–10.5)

## 2015-04-07 MED ORDER — SULFAMETHOXAZOLE-TRIMETHOPRIM 800-160 MG PO TABS: 1.0000 | ORAL_TABLET | Freq: Two times a day (BID) | ORAL | Status: DC

## 2015-04-07 NOTE — Care Management Note (Signed)
Case Management Note  Patient Details  Name: Howard Valentine MRN: [id number] Date of Birth: 1956/03/06  Subjective/Objective:      Bladder cancer              Action/Plan: Home Health. [facility] aware of dc home with [facility] for ostomy teaching.     Expected Discharge Date:  04/07/2015               Expected Discharge Plan:  [place]  In-House Referral:     Discharge planning Services  CM Consult  Post Acute Care Choice:  Home Health Choice offered to:  NA, Patient     HH Arranged:  RN [facility]:  [place]  Status of Service:  Completed, signed off  Medicare Important Message Given:    Date Medicare IM Given:    Medicare IM give by:    Date Additional Medicare IM Given:    Additional Medicare Important Message give by:     If discussed at [place] of Stay Meetings, dates discussed:    Additional Comments:  Erenest Rasher, RN 04/07/2015, 1:34 PM

## 2015-04-10 ENCOUNTER — Encounter (HOSPITAL_COMMUNITY): Payer: Self-pay | Admitting: *Deleted

## 2015-04-10 ENCOUNTER — Inpatient Hospital Stay (HOSPITAL_COMMUNITY): Payer: BLUE CROSS/BLUE SHIELD

## 2015-04-10 ENCOUNTER — Inpatient Hospital Stay (HOSPITAL_COMMUNITY)
Admission: EM | Admit: 2015-04-10 | Discharge: 2015-04-20 | DRG: 908 | Disposition: A | Discharge status: Home Health | Payer: BLUE CROSS/BLUE SHIELD | Attending: Urology | Admitting: Urology

## 2015-04-10 DIAGNOSIS — L03311 Cellulitis of abdominal wall: Secondary | ICD-10-CM | POA: Diagnosis present

## 2015-04-10 DIAGNOSIS — R066 Hiccough: Secondary | ICD-10-CM | POA: Diagnosis present

## 2015-04-10 DIAGNOSIS — Z8546 Personal history of malignant neoplasm of prostate: Secondary | ICD-10-CM

## 2015-04-10 DIAGNOSIS — K567 Ileus, unspecified: Secondary | ICD-10-CM

## 2015-04-10 DIAGNOSIS — C679 Malignant neoplasm of bladder, unspecified: Secondary | ICD-10-CM | POA: Diagnosis present

## 2015-04-10 DIAGNOSIS — Z800 Family history of malignant neoplasm of digestive organs: Secondary | ICD-10-CM

## 2015-04-10 DIAGNOSIS — E878 Other disorders of electrolyte and fluid balance, not elsewhere classified: Secondary | ICD-10-CM | POA: Diagnosis present

## 2015-04-10 DIAGNOSIS — Z932 Ileostomy status: Secondary | ICD-10-CM

## 2015-04-10 DIAGNOSIS — Z87891 Personal history of nicotine dependence: Secondary | ICD-10-CM

## 2015-04-10 DIAGNOSIS — E46 Unspecified protein-calorie malnutrition: Secondary | ICD-10-CM | POA: Diagnosis present

## 2015-04-10 DIAGNOSIS — M7989 Other specified soft tissue disorders: Secondary | ICD-10-CM

## 2015-04-10 DIAGNOSIS — K5660 Unspecified intestinal obstruction: Secondary | ICD-10-CM | POA: Diagnosis present

## 2015-04-10 DIAGNOSIS — Z79899 Other long term (current) drug therapy: Secondary | ICD-10-CM | POA: Diagnosis not present

## 2015-04-10 DIAGNOSIS — K56609 Unspecified intestinal obstruction, unspecified as to partial versus complete obstruction: Secondary | ICD-10-CM

## 2015-04-10 DIAGNOSIS — Z6823 Body mass index (BMI) 23.0-23.9, adult: Secondary | ICD-10-CM | POA: Diagnosis not present

## 2015-04-10 DIAGNOSIS — D72829 Elevated white blood cell count, unspecified: Secondary | ICD-10-CM | POA: Diagnosis present

## 2015-04-10 DIAGNOSIS — L8990 Pressure ulcer of unspecified site, unspecified stage: Secondary | ICD-10-CM | POA: Insufficient documentation

## 2015-04-10 DIAGNOSIS — I82623 Acute embolism and thrombosis of deep veins of upper extremity, bilateral: Secondary | ICD-10-CM | POA: Diagnosis present

## 2015-04-10 DIAGNOSIS — T8131XA Disruption of external operation (surgical) wound, not elsewhere classified, initial encounter: Principal | ICD-10-CM | POA: Diagnosis present

## 2015-04-10 DIAGNOSIS — E871 Hypo-osmolality and hyponatremia: Secondary | ICD-10-CM | POA: Diagnosis present

## 2015-04-10 DIAGNOSIS — R627 Adult failure to thrive: Secondary | ICD-10-CM | POA: Diagnosis present

## 2015-04-10 DIAGNOSIS — L0291 Cutaneous abscess, unspecified: Secondary | ICD-10-CM

## 2015-04-10 DIAGNOSIS — Z9079 Acquired absence of other genital organ(s): Secondary | ICD-10-CM | POA: Diagnosis not present

## 2015-04-10 DIAGNOSIS — R112 Nausea with vomiting, unspecified: Secondary | ICD-10-CM | POA: Diagnosis present

## 2015-04-10 DIAGNOSIS — Y838 Other surgical procedures as the cause of abnormal reaction of the patient, or of later complication, without mention of misadventure at the time of the procedure: Secondary | ICD-10-CM | POA: Diagnosis present

## 2015-04-10 DIAGNOSIS — M1990 Unspecified osteoarthritis, unspecified site: Secondary | ICD-10-CM | POA: Diagnosis present

## 2015-04-10 DIAGNOSIS — E860 Dehydration: Secondary | ICD-10-CM | POA: Diagnosis present

## 2015-04-10 DIAGNOSIS — Z906 Acquired absence of other parts of urinary tract: Secondary | ICD-10-CM

## 2015-04-10 LAB — COMPREHENSIVE METABOLIC PANEL
ALBUMIN: 2.5 g/dL — AB (ref 3.5–5.0)
ALT: 20 U/L (ref 17–63)
ANION GAP: 8 (ref 5–15)
AST: 24 U/L (ref 15–41)
Alkaline Phosphatase: 46 U/L (ref 38–126)
BILIRUBIN TOTAL: 0.7 mg/dL (ref 0.3–1.2)
BUN: 14 mg/dL (ref 6–20)
CO2: 28 mmol/L (ref 22–32)
Calcium: 8.3 mg/dL — ABNORMAL LOW (ref 8.9–10.3)
Chloride: 92 mmol/L — ABNORMAL LOW (ref 101–111)
Creatinine, Ser: 0.74 mg/dL (ref 0.61–1.24)
GFR calc Af Amer: >60 mL/min (ref >60)
Glucose, Bld: 147 mg/dL — ABNORMAL HIGH (ref 65–99)
POTASSIUM: 4.1 mmol/L (ref 3.5–5.1)
Sodium: 128 mmol/L — ABNORMAL LOW (ref 135–145)
TOTAL PROTEIN: 6.2 g/dL — AB (ref 6.5–8.1)

## 2015-04-10 LAB — CBC WITH DIFFERENTIAL/PLATELET
BASOS ABS: 0 10*3/uL (ref 0.0–0.1)
Basophils Relative: 0 %
Eosinophils Absolute: 0 10*3/uL (ref 0.0–0.7)
Eosinophils Relative: 0 %
HEMATOCRIT: 23.8 % — AB (ref 39.0–52.0)
Hemoglobin: 7.9 g/dL — ABNORMAL LOW (ref 13.0–17.0)
LYMPHS PCT: 7 %
Lymphs Abs: 1.7 10*3/uL (ref 0.7–4.0)
MCH: 31.1 pg (ref 26.0–34.0)
MCHC: 33.2 g/dL (ref 30.0–36.0)
MCV: 93.7 fL (ref 78.0–100.0)
Monocytes Absolute: 1.4 10*3/uL — ABNORMAL HIGH (ref 0.1–1.0)
Monocytes Relative: 6 %
NEUTROS ABS: 20.5 10*3/uL — AB (ref 1.7–7.7)
Neutrophils Relative %: 87 %
Platelets: 484 10*3/uL — ABNORMAL HIGH (ref 150–400)
RBC: 2.54 MIL/uL — AB (ref 4.22–5.81)
RDW: 14.9 % (ref 11.5–15.5)
WBC: 23.7 10*3/uL — AB (ref 4.0–10.5)

## 2015-04-10 LAB — URINALYSIS, ROUTINE W REFLEX MICROSCOPIC
Glucose, UA: NEGATIVE mg/dL
Ketones, ur: 15 mg/dL — AB
Nitrite: NEGATIVE
Protein, ur: 100 mg/dL — AB
Specific Gravity, Urine: 1.025 (ref 1.005–1.030)
Urobilinogen, UA: 1 mg/dL (ref 0.0–1.0)
pH: 5.5 (ref 5.0–8.0)

## 2015-04-10 LAB — URINE MICROSCOPIC-ADD ON

## 2015-04-10 MED ORDER — BISACODYL 10 MG RE SUPP
10.0000 mg | Freq: Every day | RECTAL | Status: DC
  Administered 2015-04-10 – 2015-04-12 (×3): 10 mg via RECTAL
  Filled 2015-04-10 (×3): qty 1

## 2015-04-10 MED ORDER — SODIUM CHLORIDE 0.9 % IV SOLN
INTRAVENOUS | Status: AC
  Administered 2015-04-10: via INTRAVENOUS
  Administered 2015-04-10: 125 mL/h via INTRAVENOUS
  Administered 2015-04-11 – 2015-04-12 (×4): via INTRAVENOUS

## 2015-04-10 MED ORDER — ZOLPIDEM TARTRATE 5 MG PO TABS: 5.0000 mg | ORAL_TABLET | Freq: Every evening | ORAL | Status: DC | PRN

## 2015-04-10 MED ORDER — GI COCKTAIL ~~LOC~~
30.0000 mL | Freq: Once | ORAL | Status: AC
  Administered 2015-04-10: 30 mL via ORAL
  Filled 2015-04-10: qty 30

## 2015-04-10 MED ORDER — KETOROLAC TROMETHAMINE 30 MG/ML IJ SOLN
30.0000 mg | Freq: Four times a day (QID) | INTRAMUSCULAR | Status: AC | PRN
  Administered 2015-04-11 – 2015-04-14 (×2): 30 mg via INTRAVENOUS
  Filled 2015-04-10 (×2): qty 1

## 2015-04-10 MED ORDER — ONDANSETRON HCL 4 MG/2ML IJ SOLN
4.0000 mg | INTRAMUSCULAR | Status: DC | PRN
  Administered 2015-04-14 – 2015-04-16 (×4): 4 mg via INTRAVENOUS
  Filled 2015-04-10 (×5): qty 2

## 2015-04-10 MED ORDER — SODIUM CHLORIDE 0.9 % IV SOLN
12.5000 mg | Freq: Three times a day (TID) | INTRAVENOUS | Status: AC | PRN
  Administered 2015-04-11 (×2): 12.5 mg via INTRAVENOUS
  Filled 2015-04-10 (×6): qty 0.5

## 2015-04-10 MED ORDER — SODIUM CHLORIDE 0.9 % IV BOLUS (SEPSIS)
1000.0000 mL | Freq: Once | INTRAVENOUS | Status: AC
  Administered 2015-04-10: 1000 mL via INTRAVENOUS

## 2015-04-10 MED ORDER — MORPHINE SULFATE (PF) 2 MG/ML IV SOLN
2.0000 mg | INTRAVENOUS | Status: DC | PRN
Start: 2015-04-10 — End: 2015-04-15
  Administered 2015-04-15: 2 mg via INTRAVENOUS
  Filled 2015-04-10: qty 2
  Filled 2015-04-10: qty 1

## 2015-04-10 MED ORDER — ENOXAPARIN SODIUM 40 MG/0.4ML ~~LOC~~ SOLN
40.0000 mg | SUBCUTANEOUS | Status: DC
  Administered 2015-04-11 – 2015-04-16 (×5): 40 mg via SUBCUTANEOUS
  Filled 2015-04-10 (×7): qty 0.4

## 2015-04-10 MED ORDER — SODIUM CHLORIDE 0.9 % IV BOLUS (SEPSIS): 1000.0000 mL | Freq: Once | INTRAVENOUS | Status: DC

## 2015-04-10 MED ORDER — SODIUM CHLORIDE 0.9 % IV SOLN
25.0000 mg | Freq: Once | INTRAVENOUS | Status: AC
  Administered 2015-04-10: 25 mg via INTRAVENOUS
  Filled 2015-04-10: qty 1

## 2015-04-10 MED ORDER — DEXTROSE 5 % IV SOLN
1.0000 g | INTRAVENOUS | Status: DC
  Administered 2015-04-10: 1 g via INTRAVENOUS
  Filled 2015-04-10: qty 10

## 2015-04-10 MED ORDER — DEXTROSE 5 % IV SOLN
1.0000 g | INTRAVENOUS | Status: AC
  Administered 2015-04-11 – 2015-04-14 (×4): 1 g via INTRAVENOUS
  Filled 2015-04-10 (×4): qty 10

## 2015-04-10 MED ORDER — OXYCODONE HCL 5 MG/5ML PO SOLN
5.0000 mg | ORAL | Status: DC | PRN
  Administered 2015-04-15: 10 mg via ORAL
  Administered 2015-04-18: 5 mg via ORAL
  Administered 2015-04-18 – 2015-04-20 (×10): 10 mg via ORAL
  Filled 2015-04-10: qty 10
  Filled 2015-04-10 (×2): qty 5
  Filled 2015-04-10 (×10): qty 10

## 2015-04-10 MED ORDER — PANTOPRAZOLE SODIUM 40 MG IV SOLR
40.0000 mg | Freq: Two times a day (BID) | INTRAVENOUS | Status: DC
  Administered 2015-04-10 – 2015-04-17 (×15): 40 mg via INTRAVENOUS
  Filled 2015-04-10 (×16): qty 40

## 2015-04-10 MED ORDER — ACETAMINOPHEN 10 MG/ML IV SOLN
1000.0000 mg | Freq: Four times a day (QID) | INTRAVENOUS | Status: DC
  Administered 2015-04-10 – 2015-04-11 (×3): 1000 mg via INTRAVENOUS
  Filled 2015-04-10 (×4): qty 100

## 2015-04-10 NOTE — H&P (Signed)
H&P  Chief Complaint: Post op dehydration and poor PO intake  History of Present Illness: 59 y.o. male s/p cystectomy and IC on 04/01/15 discharged 04/07/15. Had increasing trouble with PO intake over the last 3 days.Stopped having flatus and BMs yesterday and his last BM was about 4 days ago prior to discharge. The patient has been taking in small volumes of liquids but has been tolerating these less and less. He developed hiccups which have persisted since his nausea has increased. He had several episodes of post tussive emesis with intermittent waves of nausea not associated with food. He describes an immediate sense of fullness with food but no real nausea associated with food. The patient denies any fevers at home. The patient's pain has been controlled and he does not notice nausea with narcotic medicine. The patient reports some GERD symptoms but no chest pain or difficulty breathing.   Past Medical History  Diagnosis Date  . Arthritis   . Hematuria   . Lower urinary tract symptoms (LUTS)   . Prostate cancer urology--  dr herrick/ dr Valere Dross    DX 2014 ---  PSA 15.2,  Gleason 6/7,  vol 28cc,  s/p radical prostatectomy and external beam radiation therapy--  recurrent , PSA 0.04 (12-28-2014)  . Nocturia   . Urinary frequency    Past Surgical History  Procedure Laterality Date  . Prostate biopsy    . Robot assisted laparoscopic radical prostatectomy N/A 02/08/2013    Procedure: ROBOTIC ASSISTED LAPAROSCOPIC RADICAL PROSTATECTOMY LEVEL 2;  Surgeon: Molli Hazard, MD;  Location: [facility];  Service: Urology;  Laterality: N/A;  ROBOTIC PROSTATECTOMY, BILATERAL PELVIC LYMPH NODE DISSECTION, RIGHT NON NERVE SPARING   . Lymphadenectomy Bilateral 02/08/2013    Procedure: LYMPHADENECTOMY;  Surgeon: Molli Hazard, MD;  Location: [facility];  Service: Urology;  Laterality: Bilateral;  . Cystoscopy with retrograde pyelogram, ureteroscopy and stent placement Bilateral 03/01/2015    Procedure:  CYSTOSCOPY WITH BLADDER BIOPSY RETROGRADE BILATERAL  PYELOGRAM;  Surgeon: [name], MD;  Location: [facility];  Service: Urology;  Laterality: Bilateral;  . Cystectomy N/A 04/01/2015    Procedure: CYSTECTOMY WITH ILEAL CONDUIT ;  Surgeon: [name], MD;  Location: [facility];  Service: Urology;  Laterality: N/A;    Home Medications:   (Not in a hospital admission) Allergies: No Known Allergies  Family History  Problem Relation Age of Onset  . Colon cancer     Social History:  reports that he quit smoking about 15 years ago. His smoking use included Cigarettes. He has a 20 pack-year smoking history. He has never used smokeless tobacco. He reports that he drinks about 12.6 oz of alcohol per week. He reports that he does not use illicit drugs.  ROS: A complete review of systems was performed.  All systems are negative except for pertinent findings as noted. ROS  Physical Exam:  Vital signs in last 24 hours: Temp:  [98.1 F (36.7 C)] 98.1 F (36.7 C) (09/21 0859) Pulse Rate:  [120] 120 (09/21 0859) Resp:  [16] 16 (09/21 0859) BP: (118)/(68) 118/68 mmHg (09/21 0859) SpO2:  [98 %] 98 % (09/21 0859) General:  Alert and oriented, No acute distress HEENT: Normocephalic, atraumatic Neck: No JVD or lymphadenopathy Cardiovascular: Regular rate and rhythm Lungs: Clear bilaterally Abdomen: Soft, distended, appropriately tender, withmidline incision intact with small opening at the umbilicus and staples in place, there is a small amount of blanching erythema around the umbilicus. The patient's conduit in the  LLQ is pink patent with clear urine in the bag. Back: No CVA tenderness Extremities: 2+ lower extremity edema is symmetric  Neurologic: Grossly intact  Laboratory Data:  Results for orders placed or performed during the hospital encounter of 04/10/15 (from the past 24 hour(s))  Comprehensive metabolic panel     Status: Abnormal   Collection Time: 04/10/15   9:42 AM  Result Value Ref Range   Sodium 128 (L) 135 - 145 mmol/L   Potassium 4.1 3.5 - 5.1 mmol/L   Chloride 92 (L) 101 - 111 mmol/L   CO2 28 22 - 32 mmol/L   Glucose, Bld 147 (H) 65 - 99 mg/dL   BUN 14 6 - 20 mg/dL   Creatinine, Ser 0.74 0.61 - 1.24 mg/dL   Calcium 8.3 (L) 8.9 - 10.3 mg/dL   Total Protein 6.2 (L) 6.5 - 8.1 g/dL   Albumin 2.5 (L) 3.5 - 5.0 g/dL   AST 24 15 - 41 U/L   ALT 20 17 - 63 U/L   Alkaline Phosphatase 46 38 - 126 U/L   Total Bilirubin 0.7 0.3 - 1.2 mg/dL   GFR calc non Af Amer >60 >60 mL/min   GFR calc Af Amer >60 >60 mL/min   Anion gap 8 5 - 15  CBC with Differential     Status: Abnormal   Collection Time: 04/10/15  9:42 AM  Result Value Ref Range   WBC 23.7 (H) 4.0 - 10.5 K/uL   RBC 2.54 (L) 4.22 - 5.81 MIL/uL   Hemoglobin 7.9 (L) 13.0 - 17.0 g/dL   HCT 23.8 (L) 39.0 - 52.0 %   MCV 93.7 78.0 - 100.0 fL   MCH 31.1 26.0 - 34.0 pg   MCHC 33.2 30.0 - 36.0 g/dL   RDW 14.9 11.5 - 15.5 %   Platelets 484 (H) 150 - 400 K/uL   Neutrophils Relative % 87 %   Neutro Abs 20.5 (H) 1.7 - 7.7 K/uL   Lymphocytes Relative 7 %   Lymphs Abs 1.7 0.7 - 4.0 K/uL   Monocytes Relative 6 %   Monocytes Absolute 1.4 (H) 0.1 - 1.0 K/uL   Eosinophils Relative 0 %   Eosinophils Absolute 0.0 0.0 - 0.7 K/uL   Basophils Relative 0 %   Basophils Absolute 0.0 0.0 - 0.1 K/uL   Recent Results (from the past 240 hour(s))  MRSA PCR Screening     Status: None   Collection Time: 04/01/15 10:43 PM  Result Value Ref Range Status   MRSA by PCR NEGATIVE NEGATIVE Final    Comment:        The GeneXpert MRSA Assay (FDA approved for NASAL specimens only), is one component of a comprehensive MRSA colonization surveillance program. It is not intended to diagnose MRSA infection nor to guide or monitor treatment for MRSA infections.   Culture, blood (routine x 2)     Status: None   Collection Time: 04/02/15 10:10 AM  Result Value Ref Range Status   Specimen Description BLOOD RIGHT  ARM  Final   Special Requests IN PEDIATRIC BOTTLE 4CC  Final   Culture   Final    NO GROWTH 5 DAYS Performed at [facility]    Report Status 04/07/2015 FINAL  Final  Culture, blood (routine x 2)     Status: None   Collection Time: 04/02/15 10:32 AM  Result Value Ref Range Status   Specimen Description BLOOD LEFT HAND  Final   Special Requests IN PEDIATRIC BOTTLE  [place]  Final   Culture   Final    NO GROWTH 5 DAYS Performed at [facility]    Report Status 04/07/2015 FINAL  Final   Creatinine:  Recent Labs  04/04/15 0504 04/05/15 0447 04/06/15 0550 04/07/15 0525 04/10/15 0942  CREATININE 0.81 0.82 0.83 0.78 0.74    Impression/Assessment:  59 y.o. male s/p cystectomy with conduit on 04/01/15 back with likely dehydration and ileus. The patient has a white count but no fevers.  Plan:  - Admit to urology - IV hydration - NPO w chips - PPI - Ceftriaxone for possible wound infection  - Urine culture - Bisacodyl suppositories  - Toradol, oxycodone solution and IV tylenol for pain - lovenox, IS and ambulation  Christell Faith 04/10/2015, 11:02 AM

## 2015-04-10 NOTE — Consult Note (Signed)
WOC ostomy consult note Stoma type/location: RMQ ileal conduit Stomal assessment/size: 7/8 inch x 1 inch oval, slightly budded, red, moist, two stents intact.  Red = right, blue = left Peristomal assessment: erythematous at medial side where wound has been leaking serosanguinous fluid Treatment options for stomal/peristomal skin: skin barrier ring, convex pouch, belt Output clear yellow urine Ostomy pouching: 1pc.convex ostomy pouching system with skin barrier ring.  Education provided: patient is ill and not participating [employer].  Wife relays that he has been nauseated and with unrelenting hiccoughs since discharge on Sunday.  Patient has not eaten since Monday.  Abdomen is noted to be quite distended, staples approximating midline incision are leaking large amount of serosanguinous fluid.  Erythema noted in the periwound/peri-incisional area. Enrolled patient in [place] program: Yes, for [employer] 1-piece convex 1-inch pouches (cut-to-fit).  He has two of these in a bag in his over-the-sink cupboard along with two skin barrier rings. If additional pouching supplies are needed, will need to use house pouches that are 1 and 1/2 inch cutting surface and cut off-center. I have provided an adapter and attached him to a bedside urinary drainage bag this afternoon to divert urine.  It is noted that on Monday, patient and HHRN attempted to perform a pouch change together and it went relatively well   Will require continued support for pouching changes, particularly in light of leaking from closely located midline wound. My [place] teammates will follow in my absence until my return next week for support of both wound and ostomy. [place] nursing team will follow, and will remain available to this patient, the nursing, surgical and medical teams.   Thanks, Maudie Flakes, MSN, RN, [place], [place], [place] [phone])

## 2015-04-10 NOTE — ED Notes (Signed)
Dr. Carlota Raspberry (uro.) is here and obtains urine from his urostomy.  He tells [place] he plans to admit.

## 2015-04-10 NOTE — ED Provider Notes (Addendum)
CSN: [id number]     Arrival date & time 04/10/15  0100 History   First MD Initiated Contact with Patient 04/10/15 [phone]     Chief Complaint  Patient presents with  . Post Op Vomiting, Constipation      (Consider location/radiation/quality/duration/timing/severity/associated sxs/prior Treatment) HPI   Patient is a 59 year old male presenting with multiple complaints. Patient had prolonged hospital stay recently discharged 5 days ago. At that time patient had his bladder removed for bladder cancer and an ostomy placed. Patient had prolonged hospitals including ICU stay. Today patient has multiple complaints.  First complaint is intractable hiccups. Patient had good strength hospital stay was given Thorazine which work. According to patient's wife he is unable to get Thorazine by mouth at home and so he is been unable to manage his hiccups/  Second complaint is nausea. Patient's had intermittent nausea. Patient has occasional thrown up. And patient's wife said it occasionally is black. He has chronic epigastric and esophageal pain.  Third complaint is constipation. Patient has on MiraLAX and Colace at home. Patient has not had good stooling since Friday. The family has not tried increasing any of his medications yet.  Patient also is worried about a sort other things, including dehydration, the wound site, increased postoperative pain.    Past Medical History  Diagnosis Date  . Arthritis   . Hematuria   . Lower urinary tract symptoms (LUTS)   . Prostate cancer urology--  dr herrick/ dr Valere Dross    DX 2014 ---  PSA 15.2,  Gleason 6/7,  vol 28cc,  s/p radical prostatectomy and external beam radiation therapy--  recurrent , PSA 0.04 (12-28-2014)  . Nocturia   . Urinary frequency    Past Surgical History  Procedure Laterality Date  . Prostate biopsy    . Robot assisted laparoscopic radical prostatectomy N/A 02/08/2013    Procedure: ROBOTIC ASSISTED LAPAROSCOPIC RADICAL PROSTATECTOMY LEVEL  2;  Surgeon: Molli Hazard, MD;  Location: [facility];  Service: Urology;  Laterality: N/A;  ROBOTIC PROSTATECTOMY, BILATERAL PELVIC LYMPH NODE DISSECTION, RIGHT NON NERVE SPARING   . Lymphadenectomy Bilateral 02/08/2013    Procedure: LYMPHADENECTOMY;  Surgeon: Molli Hazard, MD;  Location: [facility];  Service: Urology;  Laterality: Bilateral;  . Cystoscopy with retrograde pyelogram, ureteroscopy and stent placement Bilateral 03/01/2015    Procedure: CYSTOSCOPY WITH BLADDER BIOPSY RETROGRADE BILATERAL  PYELOGRAM;  Surgeon: [name], MD;  Location[facility];  Service: Urology;  Laterality: Bilateral;  . Cystectomy N/A 04/01/2015    Procedure: CYSTECTOMY WITH ILEAL CONDUIT ;  Surgeon: [name], MD;  Location: [facility];  Service: Urology;  Laterality: N/A;   Family History  Problem Relation Age of Onset  . Colon cancer     Social History  Substance Use Topics  . Smoking status: Former Smoker -- 1.00 packs/day for 20 years    Types: Cigarettes    Quit date: 02/25/2000  . Smokeless tobacco: Never Used  . Alcohol Use: 12.6 oz/week    21 Cans of beer per week     Comment: 3 beers daily    Review of Systems  Constitutional: Positive for activity change and fatigue. Negative for fever.  HENT: Positive for trouble swallowing. Negative for drooling and mouth sores.   Eyes: Negative for discharge and redness.  Respiratory: Negative for cough and shortness of breath.   Cardiovascular: Negative for chest pain.  Gastrointestinal: Positive for nausea, vomiting, abdominal pain and constipation.  Genitourinary: Negative for hematuria.  Musculoskeletal: Negative for arthralgias.  Neurological: Negative for speech difficulty and light-headedness.  Psychiatric/Behavioral: Negative for behavioral problems and agitation.  All other systems reviewed and are negative.     Allergies  Review of patient's allergies indicates no known allergies.  Home  Medications   Prior to Admission medications   Medication Sig Start Date End Date Taking? Authorizing Provider  acetaminophen (TYLENOL) 500 MG tablet Take 500-1,000 mg by mouth every 6 (six) hours as needed for moderate pain.   Yes Historical Provider, MD  calcium carbonate (TUMS EX) 750 MG chewable tablet Chew 1 tablet by mouth daily.   Yes Historical Provider, MD  docusate (COLACE) 50 MG/5ML liquid Take 20 mLs (200 mg total) by mouth 2 (two) times daily. 04/05/15  Yes [name], MD  ondansetron [facility]) 4 MG/5ML solution Take 5 mLs (4 mg total) by mouth once. 04/05/15  Yes [name], MD  oxyCODONE (ROXICODONE) 5 MG/5ML solution Take 5-10 mLs (5-10 mg total) by mouth every 4 (four) hours as needed for moderate pain or severe pain. 04/05/15  Yes [name], MD  polyethylene glycol [facility] Floria Raveling) packet Take 17 g by mouth daily. 04/05/15  Yes [name], MD  ondansetron (ZOFRAN ODT) 4 MG disintegrating tablet Take 1 tablet (4 mg total) by mouth every 4 (four) hours as needed for nausea or vomiting. Patient not taking: Reported on 04/10/2015 04/06/15   Christell Faith, MD  sulfamethoxazole-trimethoprim (BACTRIM DS,SEPTRA DS) 800-160 MG per tablet Take 1 tablet by mouth 2 (two) times daily. Start taking this the day before your catheter comes out. 04/07/15   Christell Faith, MD   BP 118/68 mmHg  Pulse 120  Temp(Src) 98.1 F (36.7 C) (Oral)  Resp 16  SpO2 98% Physical Exam  Constitutional: He is oriented to person, place, and time. He appears well-nourished.  Patient mildly pale, thin.  HENT:  Head: Normocephalic.  Mouth/Throat: Oropharynx is clear and moist.  Eyes: Conjunctivae are normal.  Neck: No tracheal deviation present.  Cardiovascular: Normal rate.   Pulmonary/Chest: Effort normal. No stridor. No respiratory distress.  Abdominal: Soft. There is no tenderness. There is no guarding.  Belly is soft nontender.  Musculoskeletal: Normal range of motion.  He exhibits no edema.  Neurological: He is oriented to person, place, and time. No cranial nerve deficit.  Skin: Skin is warm and dry. No rash noted. He is not diaphoretic.  Ostomy intact. Wound is clear dry intact. No surrounding erythema.  Psychiatric: He has a normal mood and affect. His behavior is normal.  Nursing note and vitals reviewed.   ED Course  Procedures (including critical care time) Labs Review Labs Reviewed  COMPREHENSIVE METABOLIC PANEL - Abnormal; Notable for the following:    Sodium 128 (*)    Chloride 92 (*)    Glucose, Bld 147 (*)    Calcium 8.3 (*)    Total Protein 6.2 (*)    Albumin 2.5 (*)    All other components within normal limits  CBC WITH DIFFERENTIAL/PLATELET - Abnormal; Notable for the following:    WBC 23.7 (*)    RBC 2.54 (*)    Hemoglobin 7.9 (*)    HCT 23.8 (*)    Platelets 484 (*)    Neutro Abs 20.5 (*)    Monocytes Absolute 1.4 (*)    All other components within normal limits  URINE CULTURE  URINALYSIS, ROUTINE W REFLEX MICROSCOPIC (NOT AT [facility])  I-STAT CG4 LACTIC ACID, ED  Imaging Review No results found. I have personally reviewed and evaluated these images and lab results as part of my medical decision-making.   EKG Interpretation None      MDM   Final diagnoses:  Malignant neoplasm of urinary bladder, unspecified site   patient is a 59 year old male presenting with multiple complaints. Patient had recent surgery approximately placed by Dr. Louis Meckel. He prolonged hospital stay at bedtime.  Adressing his multiple symptoms  1. Hiccups: We will give patient Thorazine here to attempt to relieve his hiccups. 2. Nausea. Patient is presenting with nausea but patient states it mostly happens because of the hiccups.  He hiccups multiple times and then vomits a little bit. He states not really vomit, more like spit. Patient wife is concerned because one time he vomited some and that appeared black. We will get a CBC today to make  sure that patient is not anemic due to blood loss.  2. Constipation. We will have patient increase his MiraLAX at home to help with his constipation.   Unfortunately despite multiple complaints, I'm not sure that patient will meet inpatient criteria. It appears that both family and patient are very tired and concerned about the patient's health care. We'll get labs to make sure the patient is not have an elevated lactic the patient's not anemic nor any electrolyte abnormalities such as low potassium like he had during his hospital stay.   10:48 AM Patietn hyponatremic, high white count, will admit to urology.  Courteney Julio Alm, MD 04/10/15 [place], MD 04/10/15 1048

## 2015-04-10 NOTE — Progress Notes (Signed)
Patient is sp cystectomy with ileal conduit.  He was discharged from the hospital on Sunday.  He was seen in clinic on Monday for drainage per urethra and concern about his wound. His would was intact, there was a small area around his umbilicus that was slightly opened at the skin, no concern for infection or dehisence.  The drainage per urethra is fairly normal in this type of patient.  He described it as serous fluid.  He called our clinic yesterday concerned about hiccups which was causing him to have emesis and preventing him from eating anything substantial.  I prescribed reglan 10mg  BID for him.  He called today complaining of nausea and abdominal/esophageal burning (GERD), persistant hiccups, and constipation.  I am off today, but suspect he needs some fluids.  Would also consider BID proton pump inhibitor for his reflux.  He may also benefit from a suppository.  Thorazine was successful in the hospital for treating his hiccups.

## 2015-04-10 NOTE — ED Notes (Signed)
Report to [place], RN on [address] [place]--will transport now.

## 2015-04-10 NOTE — ED Notes (Addendum)
Pt reports he was admitted to hospital from 9/12-9/18, pt had bladder removed, now has a type of ostomy. Pt reports hiccups x24 hours and emesis. Abd pain since surgery, 4/10. Pt is on pain medication, pt has not had bowel movement in 5 days.  Family reports confusion for last 6 hours.

## 2015-04-11 ENCOUNTER — Inpatient Hospital Stay (HOSPITAL_COMMUNITY): Payer: BLUE CROSS/BLUE SHIELD

## 2015-04-11 ENCOUNTER — Encounter (HOSPITAL_COMMUNITY): Payer: Self-pay | Admitting: Radiology

## 2015-04-11 DIAGNOSIS — L8990 Pressure ulcer of unspecified site, unspecified stage: Secondary | ICD-10-CM | POA: Insufficient documentation

## 2015-04-11 LAB — BASIC METABOLIC PANEL
ANION GAP: 8 (ref 5–15)
BUN: 13 mg/dL (ref 6–20)
CALCIUM: 7.6 mg/dL — AB (ref 8.9–10.3)
CHLORIDE: 101 mmol/L (ref 101–111)
CO2: 25 mmol/L (ref 22–32)
Creatinine, Ser: 0.68 mg/dL (ref 0.61–1.24)
GFR calc Af Amer: >60 mL/min (ref >60)
GFR calc non Af Amer: >60 mL/min (ref >60)
GLUCOSE: 105 mg/dL — AB (ref 65–99)
POTASSIUM: 3.8 mmol/L (ref 3.5–5.1)
Sodium: 134 mmol/L — ABNORMAL LOW (ref 135–145)

## 2015-04-11 LAB — CBC
HEMATOCRIT: 19.5 % — AB (ref 39.0–52.0)
Hemoglobin: 6.4 g/dL — CL (ref 13.0–17.0)
MCH: 30.9 pg (ref 26.0–34.0)
MCHC: 32.8 g/dL (ref 30.0–36.0)
MCV: 94.2 fL (ref 78.0–100.0)
Platelets: 386 10*3/uL (ref 150–400)
RBC: 2.07 MIL/uL — ABNORMAL LOW (ref 4.22–5.81)
RDW: 15.2 % (ref 11.5–15.5)
WBC: 18.4 10*3/uL — AB (ref 4.0–10.5)

## 2015-04-11 LAB — PREPARE RBC (CROSSMATCH)

## 2015-04-11 LAB — URINE CULTURE: Culture: 2000

## 2015-04-11 MED ORDER — IOHEXOL 300 MG/ML  SOLN
100.0000 mL | Freq: Once | INTRAMUSCULAR | Status: AC | PRN
  Administered 2015-04-11: 100 mL via INTRAVENOUS

## 2015-04-11 MED ORDER — SODIUM CHLORIDE 0.9 % IV SOLN: Freq: Once | INTRAVENOUS | Status: DC

## 2015-04-11 MED ORDER — FUROSEMIDE 10 MG/ML IJ SOLN
20.0000 mg | Freq: Once | INTRAMUSCULAR | Status: AC
Start: 2015-04-11 — End: 2015-04-11
  Administered 2015-04-11: 20 mg via INTRAVENOUS
  Filled 2015-04-11: qty 2

## 2015-04-11 MED ORDER — IOHEXOL 300 MG/ML  SOLN: 25.0000 mL | INTRAMUSCULAR | Status: AC

## 2015-04-11 MED ORDER — ACETAMINOPHEN 10 MG/ML IV SOLN
1000.0000 mg | Freq: Four times a day (QID) | INTRAVENOUS | Status: AC
  Administered 2015-04-11 – 2015-04-12 (×3): 1000 mg via INTRAVENOUS
  Filled 2015-04-11 (×4): qty 100

## 2015-04-11 NOTE — Progress Notes (Signed)
PT Cancellation Note  Patient Details Name: KALEO CONDREY MRN: [id number] DOB: March 22, 1956   Cancelled Treatment:    Reason Eval/Treat Not Completed: Medical issues which prohibited therapy. Hgb 6.4. Will hold PT at this time. Pt scheduled to receive 2 units of blood. Will check back another time. Thanks.    Weston Anna, MPT Pager: [phone]

## 2015-04-11 NOTE — Progress Notes (Signed)
[facility]  Patient Status: Active (receiving services up to time of hospitalization)  [facility] is providing the following services: RN  If patient discharges after hours, please call [phone].   Howard Valentine 04/11/2015, 8:58 AM

## 2015-04-11 NOTE — Consult Note (Addendum)
WOC ostomy follow-up consult note Stoma type/location: RMQ ileal conduit.  Pouch was changed yesterday but surgical team is concerned it might be leaking today. Stomal assessment/size: 7/8 inch x 1 inch oval, slightly budded, red, moist, two stents intact. Peristomal assessment: Open wound where stapes have separated at 3:00 o'clock, located in very close proximaity to stoma, and slightly under wafer.Large amt yellow drainage has saturated the dressing which was applied 2 hours ago. Abd wound 1.5X1X.3cm, red and moist.  It will be difficult to maintain the seal to the urostomy pouch related to the large amt leakage of serosanguinous fluid Treatment options for stomal/peristomal skin: skin barrier ring, convex pouch, belt Output clear yellow urine Ostomy pouching: 1pc.convex ostomy pouching system with skin barrier ring.  Education provided: patient does not feel like participating in session.  Enrolled patient in [place] program: Yes, for CeraPlus 1-piece convex 1-inch pouches (cut-to-fit). Supplies at bedside. If additional pouching supplies are needed, will need to use house pouches that are 1 and 1/2 inch cutting surface and cut off-center. Will require continued support for pouching changes, particularly in light of leaking from closely located midline wound. Consider Vac placement to abd wound to control drainage if it does not improve.  Pt could benefit from continued home health assistance after discharge. [place] team will continue to follow for support. Julien Girt MSN, RN, [place], [place], [place]

## 2015-04-11 NOTE — Progress Notes (Signed)
Spoke to Fritz Pickerel NP on call for urology about HGB 6.4. No new orders yet.

## 2015-04-11 NOTE — Progress Notes (Signed)
  Subjective: The patient had 2 BMs overnight. Tolerating ice chips. Pain controlled. Continues to have hiccups controlled with Thorazine.   Objective: Vital signs in last 24 hours: Temp:  [98.1 F (36.7 C)-98.5 F (36.9 C)] 98.5 F (36.9 C) (09/22 0519) Pulse Rate:  [88-120] 88 (09/22 0519) Resp:  [16-18] 16 (09/22 0519) BP: (104-124)/(53-68) 109/53 mmHg (09/22 0519) SpO2:  [94 %-98 %] 98 % (09/22 0519)  Intake/Output from previous day: 25-Apr-2023 0701 - 09/22 0700 In: 545.8 [I.V.:445.8; IV Piggyback:100] Out: 275 [Urine:275] Intake/Output this shift:    Physical Exam:  General: Alert and oriented CV: RRR Lungs: Clear Abdomen: Soft, distended, slightly ender ostomy with clear urine Incisions: midline incision with staples open in the midportion with significant leakage. Ext: NT, No erythema  Lab Results:  Recent Labs  04-25-15 0942 04/11/15 0445  HGB 7.9* 6.4*  HCT 23.8* 19.5*   BMET  Recent Labs  04/25/15 0942 04/11/15 0445  NA 128* 134*  K 4.1 3.8  CL 92* 101  CO2 28 25  GLUCOSE 147* 105*  BUN 14 13  CREATININE 0.74 0.68  CALCIUM 8.3* 7.6*     Studies/Results: Dg Abd 2 Views  2015-04-25   CLINICAL DATA:  s/p cystectomy and ileal conduit on 04/01/15 discharged 04/07/15. Had increasing trouble with PO intake over the last 3 days.Stopped having flatus and BMs yesterday and his last BM was about 4 days ago prior to discharge.concern for possible bowel obstruction  EXAM: ABDOMEN - 2 VIEW  COMPARISON:  03/12/15  FINDINGS: There are multiple air-fluid levels in small and large bowel. There is no free air. Loops of small bowel are dilated to 5 cm and there is little gas into the left colon. Bilateral percutaneous ureteral stents are identified.  IMPRESSION: The findings are concerning for partial small bowel obstruction. Consider CT of the abdomen and pelvis with contrast.   Electronically Signed   By: Skipper Cliche M.D.   On: 04/25/2015 11:49    Assessment/Plan: 59  y.o. male s/p cystectomy w IC on 04/01/15 readmitted for n/v on 04-25-15.  - Pain control - 2 U PRBC - NPO until CT scan - CT abd/pel w/w/o con - lovenox ppx - PT eval - continue antibiotics and follow urine culture    LOS: 1 day   Howard Valentine 04/11/2015, 7:35 AM

## 2015-04-12 LAB — BASIC METABOLIC PANEL
ANION GAP: 10 (ref 5–15)
BUN: 11 mg/dL (ref 6–20)
CHLORIDE: 101 mmol/L (ref 101–111)
CO2: 24 mmol/L (ref 22–32)
CREATININE: 0.81 mg/dL (ref 0.61–1.24)
Calcium: 7.4 mg/dL — ABNORMAL LOW (ref 8.9–10.3)
GFR calc non Af Amer: >60 mL/min (ref >60)
Glucose, Bld: 85 mg/dL (ref 65–99)
Potassium: 3.6 mmol/L (ref 3.5–5.1)
SODIUM: 135 mmol/L (ref 135–145)

## 2015-04-12 LAB — TYPE AND SCREEN
ABO/RH(D): A POS
Antibody Screen: NEGATIVE
UNIT DIVISION: 0
Unit division: 0

## 2015-04-12 LAB — CBC
HCT: 29.1 % — ABNORMAL LOW (ref 39.0–52.0)
HEMOGLOBIN: 9.9 g/dL — AB (ref 13.0–17.0)
MCH: 31.2 pg (ref 26.0–34.0)
MCHC: 34 g/dL (ref 30.0–36.0)
MCV: 91.8 fL (ref 78.0–100.0)
Platelets: 449 10*3/uL — ABNORMAL HIGH (ref 150–400)
RBC: 3.17 MIL/uL — AB (ref 4.22–5.81)
RDW: 15 % (ref 11.5–15.5)
WBC: 23 10*3/uL — AB (ref 4.0–10.5)

## 2015-04-12 LAB — GLUCOSE, CAPILLARY: GLUCOSE-CAPILLARY: 114 mg/dL — AB (ref 65–99)

## 2015-04-12 LAB — PHOSPHORUS: PHOSPHORUS: 2.6 mg/dL (ref 2.5–4.6)

## 2015-04-12 LAB — MAGNESIUM: MAGNESIUM: 1.5 mg/dL — AB (ref 1.7–2.4)

## 2015-04-12 MED ORDER — FAT EMULSION 20 % IV EMUL
240.0000 mL | INTRAVENOUS | Status: DC
  Filled 2015-04-12: qty 250

## 2015-04-12 MED ORDER — PREMIER PROTEIN SHAKE
2.0000 [oz_av] | Freq: Three times a day (TID) | ORAL | Status: DC
  Administered 2015-04-12 – 2015-04-16 (×5): 2 [oz_av] via ORAL
  Filled 2015-04-12 (×4): qty 325.31

## 2015-04-12 MED ORDER — MAGNESIUM SULFATE 2 GM/50ML IV SOLN
2.0000 g | Freq: Once | INTRAVENOUS | Status: AC
  Administered 2015-04-12: 2 g via INTRAVENOUS
  Filled 2015-04-12: qty 50

## 2015-04-12 MED ORDER — ACETAMINOPHEN 10 MG/ML IV SOLN
1000.0000 mg | Freq: Four times a day (QID) | INTRAVENOUS | Status: AC
  Administered 2015-04-12 – 2015-04-13 (×4): 1000 mg via INTRAVENOUS
  Filled 2015-04-12 (×6): qty 100

## 2015-04-12 MED ORDER — POLYETHYLENE GLYCOL 3350 17 G PO PACK
17.0000 g | PACK | Freq: Every day | ORAL | Status: DC
  Administered 2015-04-12: 17 g via ORAL
  Filled 2015-04-12: qty 1

## 2015-04-12 MED ORDER — TRACE MINERALS CR-CU-MN-SE-ZN 10-1000-500-60 MCG/ML IV SOLN
INTRAVENOUS | Status: DC
  Filled 2015-04-12: qty 960

## 2015-04-12 MED ORDER — FAT EMULSION 20 % IV EMUL
240.0000 mL | INTRAVENOUS | Status: AC
  Administered 2015-04-12: 240 mL via INTRAVENOUS
  Filled 2015-04-12: qty 250

## 2015-04-12 MED ORDER — SODIUM CHLORIDE 0.9 % IJ SOLN
10.0000 mL | INTRAMUSCULAR | Status: DC | PRN
  Administered 2015-04-15: 10 mL
  Filled 2015-04-12: qty 40

## 2015-04-12 MED ORDER — TRACE MINERALS CR-CU-MN-SE-ZN 10-1000-500-60 MCG/ML IV SOLN
INTRAVENOUS | Status: AC
  Administered 2015-04-12: 18:00:00 via INTRAVENOUS
  Filled 2015-04-12: qty 960

## 2015-04-12 MED ORDER — SODIUM CHLORIDE 0.9 % IJ SOLN
10.0000 mL | Freq: Two times a day (BID) | INTRAMUSCULAR | Status: DC
  Administered 2015-04-12: 20 mL
  Administered 2015-04-13 – 2015-04-14 (×2): 10 mL
  Administered 2015-04-15: 20 mL
  Administered 2015-04-15: 10 mL

## 2015-04-12 MED ORDER — INSULIN ASPART 100 UNIT/ML ~~LOC~~ SOLN
0.0000 [IU] | SUBCUTANEOUS | Status: DC
  Administered 2015-04-13 – 2015-04-14 (×4): 1 [IU] via SUBCUTANEOUS
  Administered 2015-04-14: 2 [IU] via SUBCUTANEOUS
  Administered 2015-04-15 (×2): 1 [IU] via SUBCUTANEOUS
  Administered 2015-04-15: 2 [IU] via SUBCUTANEOUS
  Administered 2015-04-16: 1 [IU] via SUBCUTANEOUS
  Administered 2015-04-16: 2 [IU] via SUBCUTANEOUS
  Administered 2015-04-16: 1 [IU] via SUBCUTANEOUS

## 2015-04-12 MED ORDER — SODIUM CHLORIDE 0.9 % IV SOLN
INTRAVENOUS | Status: DC
  Administered 2015-04-12 – 2015-04-13 (×2): via INTRAVENOUS

## 2015-04-12 NOTE — Progress Notes (Signed)
Brief note:  PARENTERAL NUTRITION CONSULT  59yo M s/p cystectomy w/ ileul conduit on 04/01/15 for bladder cancer, admitted 04/10/15 w/ increasing nausea over the past 3 days, constipation for over a week, and cellulitis & drainage at his midline incision.  CT showed partial SBO at the site of the anastomosis. Pharmacy is consulted to begin parenteral nutrition on 9/23.   Noted baseline labs with low Magnesium level of 1.5, other elytes WNL  PLAN                                                                                                                          See plan in full note earlier today from Romeo Rabon, PharmD  Magnesium 2g IV x1 dose  Recheck BMET, Mag and Phos with AM labs.  Gretta Arab PharmD, BCPS Pager [phone] 04/12/2015 7:03 PM

## 2015-04-12 NOTE — Progress Notes (Signed)
PARENTERAL NUTRITION CONSULT NOTE - INITIAL  Pharmacy Consult for TPN Indication: partial small bowel obstruction, recent bowel surgery  No Known Allergies  Patient Measurements: Height: 5\' 6"  (167.6 cm) Weight: 148 lb (67.132 kg) IBW/kg (Calculated) : 63.8  Vital Signs: Temp: 97.9 F (36.6 C) (09/23 0506) Temp Source: Oral (09/23 0506) BP: 137/66 mmHg (09/23 0506) Pulse Rate: 96 (09/23 0506) Intake/Output from previous day: 09/22 0701 - 09/23 0700 In: 3677.3 [P.O.:360; I.V.:2621.4; Blood:695.8] Out: 2000 [Urine:2000] Intake/Output from this shift: Total I/O In: 1752.1 [P.O.:550; I.V.:952.1; IV Piggyback:250] Out: -   Labs:  Recent Labs  04/10/15 0942 04/11/15 0445 04/12/15 0358  WBC 23.7* 18.4* 23.0*  HGB 7.9* 6.4* 9.9*  HCT 23.8* 19.5* 29.1*  PLT 484* 386 449*     Recent Labs  04/10/15 0942 04/11/15 0445 04/12/15 0358  NA 128* 134* 135  K 4.1 3.8 3.6  CL 92* 101 101  CO2 28 25 24   GLUCOSE 147* 105* 85  BUN 14 13 11   CREATININE 0.74 0.68 0.81  CALCIUM 8.3* 7.6* 7.4*  PROT 6.2*  --   --   ALBUMIN 2.5*  --   --   AST 24  --   --   ALT 20  --   --   ALKPHOS 46  --   --   BILITOT 0.7  --   --    Estimated Creatinine Clearance: 87.5 mL/min (by C-G formula based on Cr of 0.81).   No results for input(s): GLUCAP in the last 72 hours.  Medical History: Past Medical History  Diagnosis Date  . Arthritis   . Hematuria   . Lower urinary tract symptoms (LUTS)   . Prostate cancer urology--  dr herrick/ dr Valere Dross    DX 2014 ---  PSA 15.2,  Gleason 6/7,  vol 28cc,  s/p radical prostatectomy and external beam radiation therapy--  recurrent , PSA 0.04 (12-28-2014)  . Nocturia   . Urinary frequency     Medications:  Scheduled:  . sodium chloride   Intravenous Once  . acetaminophen  1,000 mg Intravenous Q6H  . bariatric protein shake  2 oz Oral TID WC  . bisacodyl  10 mg Rectal Daily  . cefTRIAXone (ROCEPHIN)  IV  1 g Intravenous Q24H  . enoxaparin  (LOVENOX) injection  40 mg Subcutaneous Q24H  . pantoprazole (PROTONIX) IV  40 mg Intravenous Q12H  . polyethylene glycol  17 g Oral Daily  . sodium chloride  1,000 mL Intravenous Once  . sodium chloride  10-40 mL Intracatheter Q12H   Infusions:  . sodium chloride 125 mL/hr at 04/12/15 1100   Insulin Requirements: none  Current Nutrition: [place] diet  IVF: NS at 119ml/hr  Central access: PICC  TPN start date: 9/23  ASSESSMENT                                                                                                          59yo M s/p cystectomy w/ ileul conduit on 04/01/15 for bladder cancer, admitted 04/10/15 w/ increasing nausea over the past  3 days, constipation for over a week, and cellulitis & drainage at his midline incision.  CT showed partial SBO at the site of the anastomosis. Pharmacy is consulted to begin parenteral nutrition.  Significant events:   Today:   Glucose - 85-147 since admission. Goal while TPN <150.   Electrolytes - wnl, CorrCa 8.6. No mag or phos this admission.  Renal - SCr wnl  LFTs - wnl  TGs -   Prealbumin -  NUTRITIONAL GOALS                                                                                             RD recs: 70-80g/day protein, 1700-2000Kcal/day. Clinimix 5/15 at a goal rate of 88ml/hr + 20% fat emulsion at 7ml/hr to provide: 96g/day protein, 1843Kcal/day.  PLAN                                                                                                                         At 1800 today:  Start Clinimix E 5/15 at 71ml/hr.  20% fat emulsion at 95ml/hr.  Plan to advance as tolerated to the goal rate.  TPN to contain standard multivitamins and trace elements.  Reduce IVF to 10ml/hr.  Add Sensitive SSI q4h.   Will check a mag and phos now and supplement if necessary.  TPN lab panels on Mondays & Thursdays.  F/u daily.  Romeo Rabon, PharmD, pager [phone]. 04/12/2015,4:14 PM.

## 2015-04-12 NOTE — Progress Notes (Signed)
Subjective: CT showed likely partial SBO at the site of the anastomosis with stool and liquid beyond indicating passage. The patient began passing larger volume flatus and continues to have small volume BMs. The patient ambulated in the halls. Received 2 U PRBC.   Objective: Vital signs in last 24 hours: Temp:  [97.9 F (36.6 C)-98.9 F (37.2 C)] 97.9 F (36.6 C) (09/23 0506) Pulse Rate:  [81-98] 96 (09/23 0506) Resp:  [16-18] 18 (09/23 0506) BP: (114-137)/(49-66) 137/66 mmHg (09/23 0506) SpO2:  [96 %-100 %] 99 % (09/23 0506) Weight:  [67.132 kg (148 lb)] 67.132 kg (148 lb) (09/22 0800)  Intake/Output from previous day: 09/22 0701 - 09/23 0700 In: 335 [Blood:335] Out: 2000 [Urine:2000] Intake/Output this shift:    Physical Exam:  General: Alert and oriented CV: RRR Lungs: Clear Abdomen: Soft, distended, slightly tender, ostomy with clear urine Incisions: midline incision with staples open in the midportion with significant leakage. Ext: NT, No erythema  Lab Results:  Recent Labs  04/10/15 0942 04/11/15 0445 04/12/15 0358  HGB 7.9* 6.4* 9.9*  HCT 23.8* 19.5* 29.1*   BMET  Recent Labs  04/11/15 0445 04/12/15 0358  NA 134* 135  K 3.8 3.6  CL 101 101  CO2 25 24  GLUCOSE 105* 85  BUN 13 11  CREATININE 0.68 0.81  CALCIUM 7.6* 7.4*     Studies/Results: Ct Abdomen Pelvis W Contrast  04/11/2015   CLINICAL DATA:  Changes small bowel obstruction on recent plain film examination.  EXAM: CT ABDOMEN AND PELVIS WITH CONTRAST  TECHNIQUE: Multidetector CT imaging of the abdomen and pelvis was performed using the standard protocol following bolus administration of intravenous contrast.  CONTRAST:  162mL OMNIPAQUE IOHEXOL 300 MG/ML  SOLN  COMPARISON:  03/12/2015  FINDINGS: Lung bases demonstrate mild bibasilar atelectasis of an acute nature and small pleural effusions. The liver, gallbladder, spleen, adrenal glands and pancreas are within normal limits. The kidneys are well  visualized bilaterally with the ureteral stents in place. No obstructive changes are noted. The ureteral stents extend into an ileostomy in the right mid abdomen.  Diffuse small bowel dilatation involving the jejunum and proximal ileum is identified. One of the loops courses into a a small area of herniation just below the recent surgical incision although no incarceration is noted. A transition zone lies in the right mid abdomen adjacent to the urostomy loop. Given the relative recent surgery this is not felt to represent adhesions the more distal small bowel is within normal limits. The colon demonstrates some mild focal areas of wall thickening in the postoperative site likely related to postoperative inflammatory change. A minimal amount of free pelvic fluid is seen. The bladder has been surgically removed. No significant lymphadenopathy is seen. The osseous structures show no acute abnormality.  IMPRESSION: Changes consistent with small bowel dilatation secondary to a transition zone in the distal ileum adjacent to the patient's known urostomy loop.  Small herniation beneath the recent surgical incision in the midline although no incarceration is noted.  Mild free pelvic fluid within the pelvis likely postoperative in nature. No focal abscess is identified.  Mild acute basilar atelectasis and small associated effusions.   Electronically Signed   By: Inez Catalina M.D.   On: 04/11/2015 14:15   Dg Abd 2 Views  04/10/2015   CLINICAL DATA:  s/p cystectomy and ileal conduit on 04/01/15 discharged 04/07/15. Had increasing trouble with PO intake over the last 3 days.Stopped having flatus and BMs yesterday and his last  BM was about 4 days ago prior to discharge.concern for possible bowel obstruction  EXAM: ABDOMEN - 2 VIEW  COMPARISON:  03/12/15  FINDINGS: There are multiple air-fluid levels in small and large bowel. There is no free air. Loops of small bowel are dilated to 5 cm and there is little gas into the left  colon. Bilateral percutaneous ureteral stents are identified.  IMPRESSION: The findings are concerning for partial small bowel obstruction. Consider CT of the abdomen and pelvis with contrast.   Electronically Signed   By: Skipper Cliche M.D.   On: 04/10/2015 11:49    Assessment/Plan: 59 y.o. male s/p cystectomy w IC on 04/01/15 readmitted for n/v on 04/10/15.  - Pain control - Clears with supplement shakes - Miralax and suppositories - lovenox ppx - PT eval - Antibiotics x5 days for cellulitis at the wound - Urine culture negative    LOS: 2 days   Christell Faith 04/12/2015, 7:25 AM

## 2015-04-12 NOTE — Progress Notes (Signed)
Initial Nutrition Assessment  DOCUMENTATION CODES:   Not applicable  INTERVENTION:  TPN Goal Rate: 83.33 ml/hr  Provides:  300g Dextrose 100g AA 250 ml 20% lipids 1920 calories  With high risk of refeeding, possibly begin TPN at 20 ml/hr    NUTRITION DIAGNOSIS:   Inadequate oral intake related to inability to eat, nausea, vomiting, poor appetite as evidenced by per patient/family report, meal completion < 50%.    GOAL:   Patient will meet greater than or equal to 90% of their needs    MONITOR:   Diet advancement, PO intake, Supplement acceptance, Labs, I & O's, Skin  REASON FOR ASSESSMENT:   Consult New TPN/TNA  ASSESSMENT:   59 yo male presents for post op dehydration and poor po intake. s/p cystectomy and IC 9/12. Pt was having trouble with po intake and stop having flatus and BMs on 9/20. Pt was exhibting  nausea, vomitting, and a quick sense of fullness with meals. Pt does nto exhibit signs or symptoms of malnutrition. Pt did say ensure fills him up quickly. Pt did not provide a reason for poor appetite now that his nausea and vomitting have cleared up, outside of sense of fullness. MD wants to begin TPN tonight.    Diet Order:  Diet full liquid Room service appropriate?: Yes; Fluid consistency:: Thin; Fluid restriction:: 1500 mL Fluid  Skin:  Wound (see comment) (Stg II Pressure Ulcer to buttocks, surgical incision on abdomen,)  Last BM:  9/23  Height:   Ht Readings from Last 1 Encounters:  04/11/15 5\' 6"  (1.676 m)    Weight:   Wt Readings from Last 1 Encounters:  04/11/15 148 lb (67.132 kg)    Ideal Body Weight:  64.54 kg  BMI:  Body mass index is 23.9 kg/(m^2).  Estimated Nutritional Needs:   Kcal:  1700-2000  Protein:  70-80 grams  Fluid:  >=/ 1.7L  EDUCATION NEEDS:   No education needs identified at this time  Satira Anis. Ward, MS, RD LDN After Hours/Weekend Pager [phone]

## 2015-04-12 NOTE — Plan of Care (Signed)
Problem: Phase I Progression Outcomes Goal: OOB as tolerated unless otherwise ordered Outcome: Progressing Ambulated in hallway with staff

## 2015-04-12 NOTE — Progress Notes (Signed)
Peripherally Inserted Central Catheter/Midline Placement  The IV Nurse has discussed with the patient and/or persons authorized to consent for the patient, the purpose of this procedure and the potential benefits and risks involved with this procedure.  The benefits include less needle sticks, lab draws from the catheter and patient may be discharged home with the catheter.  Risks include, but not limited to, infection, bleeding, blood clot (thrombus formation), and puncture of an artery; nerve damage and irregular heat beat.  Alternatives to this procedure were also discussed.  PICC/Midline Placement Documentation  PICC / Midline Double Lumen 21/19/41 PICC Right Basilic 40 cm 0 cm (Active)  Indication for Insertion or Continuance of Line Administration of hyperosmolar/irritating solutions (i.e. TPN, Vancomycin, etc.) 04/12/2015 10:00 AM  Exposed Catheter (cm) 0 cm 04/12/2015 10:00 AM  Site Assessment Clean;Dry;Intact 04/12/2015 10:00 AM  Lumen #1 Status Flushed;Saline locked;Blood return noted 04/12/2015 10:00 AM  Lumen #2 Status Flushed;Saline locked;Blood return noted 04/12/2015 10:00 AM  Dressing Type Transparent 04/12/2015 10:00 AM  Dressing Change Due 04/19/15 04/12/2015 10:00 AM       Gordan Payment 04/12/2015, 10:04 AM

## 2015-04-12 NOTE — Progress Notes (Signed)
PT Cancellation Note  Patient Details Name: CANE DUBRAY MRN: [id number] DOB: May 07, 1956   Cancelled Treatment:    Reason Eval/Treat Not Completed: Attempted PT eval-pt declined to participate at this time. Pt reports he has been mobilizing with nursing/family "you missed the first two walks." Will check back another day. Thanks.   Weston Anna, MPT Pager: [phone]

## 2015-04-13 LAB — CBC
HEMATOCRIT: 29.3 % — AB (ref 39.0–52.0)
HEMOGLOBIN: 9.9 g/dL — AB (ref 13.0–17.0)
MCH: 30.7 pg (ref 26.0–34.0)
MCHC: 33.8 g/dL (ref 30.0–36.0)
MCV: 91 fL (ref 78.0–100.0)
Platelets: 453 10*3/uL — ABNORMAL HIGH (ref 150–400)
RBC: 3.22 MIL/uL — AB (ref 4.22–5.81)
RDW: 15 % (ref 11.5–15.5)
WBC: 20.8 10*3/uL — ABNORMAL HIGH (ref 4.0–10.5)

## 2015-04-13 LAB — DIFFERENTIAL
BASOS ABS: 0 10*3/uL (ref 0.0–0.1)
Basophils Relative: 0 %
EOS PCT: 2 %
Eosinophils Absolute: 0.4 10*3/uL (ref 0.0–0.7)
LYMPHS PCT: 3 %
Lymphs Abs: 0.6 10*3/uL — ABNORMAL LOW (ref 0.7–4.0)
MONO ABS: 1.9 10*3/uL — AB (ref 0.1–1.0)
MONOS PCT: 9 %
Neutro Abs: 17.9 10*3/uL — ABNORMAL HIGH (ref 1.7–7.7)
Neutrophils Relative %: 86 %

## 2015-04-13 LAB — GLUCOSE, CAPILLARY
GLUCOSE-CAPILLARY: 125 mg/dL — AB (ref 65–99)
GLUCOSE-CAPILLARY: 130 mg/dL — AB (ref 65–99)
Glucose-Capillary: 119 mg/dL — ABNORMAL HIGH (ref 65–99)
Glucose-Capillary: 120 mg/dL — ABNORMAL HIGH (ref 65–99)
Glucose-Capillary: 126 mg/dL — ABNORMAL HIGH (ref 65–99)
Glucose-Capillary: 132 mg/dL — ABNORMAL HIGH (ref 65–99)

## 2015-04-13 LAB — COMPREHENSIVE METABOLIC PANEL
ALBUMIN: 1.9 g/dL — AB (ref 3.5–5.0)
ALK PHOS: 54 U/L (ref 38–126)
ALT: 16 U/L — AB (ref 17–63)
AST: 24 U/L (ref 15–41)
Anion gap: 5 (ref 5–15)
BILIRUBIN TOTAL: 0.3 mg/dL (ref 0.3–1.2)
BUN: 8 mg/dL (ref 6–20)
CALCIUM: 7.1 mg/dL — AB (ref 8.9–10.3)
CO2: 24 mmol/L (ref 22–32)
CREATININE: 0.58 mg/dL — AB (ref 0.61–1.24)
Chloride: 102 mmol/L (ref 101–111)
GFR calc Af Amer: >60 mL/min (ref >60)
GFR calc non Af Amer: >60 mL/min (ref >60)
GLUCOSE: 126 mg/dL — AB (ref 65–99)
Potassium: 3 mmol/L — ABNORMAL LOW (ref 3.5–5.1)
SODIUM: 131 mmol/L — AB (ref 135–145)
Total Protein: 4.9 g/dL — ABNORMAL LOW (ref 6.5–8.1)

## 2015-04-13 LAB — TRIGLYCERIDES: Triglycerides: 137 mg/dL (ref <150)

## 2015-04-13 LAB — MAGNESIUM: Magnesium: 1.9 mg/dL (ref 1.7–2.4)

## 2015-04-13 LAB — PHOSPHORUS: PHOSPHORUS: 2.3 mg/dL — AB (ref 2.5–4.6)

## 2015-04-13 LAB — PREALBUMIN: Prealbumin: 4.8 mg/dL — ABNORMAL LOW (ref 18–38)

## 2015-04-13 MED ORDER — POLYETHYLENE GLYCOL 3350 17 G PO PACK
17.0000 g | PACK | Freq: Every day | ORAL | Status: DC | PRN
  Administered 2015-04-13 – 2015-04-19 (×4): 17 g via ORAL
  Filled 2015-04-13 (×4): qty 1

## 2015-04-13 MED ORDER — POTASSIUM PHOSPHATES 15 MMOLE/5ML IV SOLN
20.0000 mmol | Freq: Once | INTRAVENOUS | Status: AC
  Administered 2015-04-13: 20 mmol via INTRAVENOUS
  Filled 2015-04-13: qty 6.67

## 2015-04-13 MED ORDER — BISACODYL 10 MG RE SUPP: 10.0000 mg | Freq: Every day | RECTAL | Status: DC | PRN

## 2015-04-13 MED ORDER — M.V.I. ADULT IV INJ
INJECTION | INTRAVENOUS | Status: AC
  Administered 2015-04-13: 17:00:00 via INTRAVENOUS
  Filled 2015-04-13: qty 960

## 2015-04-13 MED ORDER — FAT EMULSION 20 % IV EMUL
240.0000 mL | INTRAVENOUS | Status: AC
  Administered 2015-04-13: 240 mL via INTRAVENOUS
  Filled 2015-04-13: qty 250

## 2015-04-13 MED ORDER — POTASSIUM CHLORIDE 10 MEQ/100ML IV SOLN
10.0000 meq | INTRAVENOUS | Status: AC
Start: 2015-04-13 — End: 2015-04-13
  Administered 2015-04-13 (×4): 10 meq via INTRAVENOUS
  Filled 2015-04-13 (×4): qty 100

## 2015-04-13 NOTE — Progress Notes (Signed)
PARENTERAL NUTRITION CONSULT NOTE - INITIAL  Pharmacy Consult for TPN Indication: partial small bowel obstruction, recent bowel surgery  No Known Allergies  Patient Measurements: Height: 5\' 6"  (167.6 cm) Weight: 148 lb (67.132 kg) IBW/kg (Calculated) : 63.8  Vital Signs: Temp: 98.3 F (36.8 C) (09/24 0438) Temp Source: Oral (09/24 0438) BP: 143/68 mmHg (09/24 0438) Pulse Rate: 97 (09/24 0438) Intake/Output from previous day: 09/23 0701 - 09/24 0700 In: 2202.1 [P.O.:1000; I.V.:952.1; IV Piggyback:250] Out: 750 [Urine:750] Intake/Output from this shift:    Labs:  Recent Labs  04/11/15 0445 04/12/15 0358 04/13/15 0507  WBC 18.4* 23.0* 20.8*  HGB 6.4* 9.9* 9.9*  HCT 19.5* 29.1* 29.3*  PLT 386 449* 453*     Recent Labs  04/10/15 0942 04/11/15 0445 04/12/15 0358 04/12/15 1630 04/13/15 0507  NA 128* 134* 135  --  131*  K 4.1 3.8 3.6  --  3.0*  CL 92* 101 101  --  102  CO2 28 25 24   --  24  GLUCOSE 147* 105* 85  --  126*  BUN 14 13 11   --  8  CREATININE 0.74 0.68 0.81  --  0.58*  CALCIUM 8.3* 7.6* 7.4*  --  7.1*  MG  --   --   --  1.5* 1.9  PHOS  --   --   --  2.6 2.3*  PROT 6.2*  --   --   --  4.9*  ALBUMIN 2.5*  --   --   --  1.9*  AST 24  --   --   --  24  ALT 20  --   --   --  16*  ALKPHOS 46  --   --   --  54  BILITOT 0.7  --   --   --  0.3   Estimated Creatinine Clearance: 88.6 mL/min (by C-G formula based on Cr of 0.58).    Recent Labs  04/12/15 2051 04/12/15 2353 04/13/15 0430  GLUCAP 114* 126* 119*   Medical History: Past Medical History  Diagnosis Date  . Arthritis   . Hematuria   . Lower urinary tract symptoms (LUTS)   . Prostate cancer urology--  dr herrick/ dr Valere Dross    DX 2014 ---  PSA 15.2,  Gleason 6/7,  vol 28cc,  s/p radical prostatectomy and external beam radiation therapy--  recurrent , PSA 0.04 (12-28-2014)  . Nocturia   . Urinary frequency    Medications:  Scheduled:  . sodium chloride   Intravenous Once  . bariatric  protein shake  2 oz Oral TID WC  . bisacodyl  10 mg Rectal Daily  . cefTRIAXone (ROCEPHIN)  IV  1 g Intravenous Q24H  . enoxaparin (LOVENOX) injection  40 mg Subcutaneous Q24H  . insulin aspart  0-9 Units Subcutaneous 6 times per day  . pantoprazole (PROTONIX) IV  40 mg Intravenous Q12H  . polyethylene glycol  17 g Oral Daily  . potassium chloride  10 mEq Intravenous Q1 Hr x 4  . potassium phosphate IVPB (mmol)  20 mmol Intravenous Once  . sodium chloride  1,000 mL Intravenous Once  . sodium chloride  10-40 mL Intracatheter Q12H   Infusions:  . sodium chloride 75 mL/hr at 04/12/15 1759  . Marland KitchenTPN (CLINIMIX-E) Adult 40 mL/hr at 04/12/15 1741   And  . fat emulsion 240 mL (04/12/15 1741)   Insulin Requirements: sensitive Novolog scale q4hr CBG 126, 119 - no insulin   Current Nutrition: FL diet,  Bariatric Protein shake ordered tid with meals  IVF: NS at 52ml/hr  Central access: PICC  TPN start date: 9/23  ASSESSMENT                                                                                                          59yo M s/p cystectomy w/ ileul conduit on 04/01/15 for bladder cancer, admitted 04/10/15 w/ increasing nausea over the past 3 days, constipation for over a week, and cellulitis & drainage at his midline incision.  CT showed partial SBO at the site of the anastomosis. Pharmacy is consulted to begin parenteral nutrition.  Significant events:   Today:   Glucose - 126 this am. Goal while TPN <150.   Electrolytes - K 3.0, Mag 1.9 (2gm bolus 9/23 pm), Phos 2.3, Corr Ca 8.7  Renal - SCr wnl  LFTs - wnl  TGs - pending  Prealbumin - pending  NUTRITIONAL GOALS                                                                                             RD recs: 70-80g/day protein, 1700-2000Kcal/day. Clinimix 5/15 at a goal rate of 89ml/hr + 20% fat emulsion at 16ml/hr to provide: 96g/day protein, 1843Kcal/day.  PLAN                                                                                                                          At 1800 today:  Continue Clinimix E 5/15 at 72ml/hr  20% fat emulsion at 73ml/hr.  Plan to advance as tolerated to the goal rate when electrolytes stable  TPN to contain standard multivitamins and trace elements.  Continue IVF at 62ml/hr.  Sensitive Novolog SSI q4h.   KCl runs 10 mEq hourly x4 today, K Phos 20 mMol bolus today  BMET, Mag, Phos levels in am  TPN lab panels on Mondays & Thursdays.  F/u daily.  Minda Ditto PharmD Pager [phone] 04/13/2015, 7:31 AM

## 2015-04-13 NOTE — Progress Notes (Signed)
Subjective: Patient reports minimal discomfort. He is tolerating his clear liquid diet. He has been on scheduled laxity is and is passing both flatus and stool. The volume of his stools has diminished somewhat and has become somewhat looser. He does have some appetite but not much.  Objective: Vital signs in last 24 hours: Temp:  [98.1 F (36.7 C)-98.9 F (37.2 C)] 98.3 F (36.8 C) (09/24 0438) Pulse Rate:  [75-97] 97 (09/24 0438) Resp:  [16-18] 18 (09/24 0438) BP: (128-143)/(68-76) 143/68 mmHg (09/24 0438) SpO2:  [93 %-100 %] 100 % (09/24 0438)  Intake/Output from previous day: 09/23 0701 - 09/24 0700 In: 2202.1 [P.O.:1000; I.V.:952.1; IV Piggyback:250] Out: 750 [Urine:750] Intake/Output this shift:    Physical Exam:  His abdomen is flat and soft. His ostomy is pink with 2 stents exiting and clear urine in the bag. The wound does not appear to have any significant erythema. Staples are in place and remain intact. The wound is draining clear fluid primarily from the lower portion.  Lab Results:  Recent Labs  04/11/15 0445 04/12/15 0358 04/13/15 0507  HGB 6.4* 9.9* 9.9*  HCT 19.5* 29.1* 29.3*   BMET  Recent Labs  04/12/15 0358 04/13/15 0507  NA 135 131*  K 3.6 3.0*  CL 101 102  CO2 24 24  GLUCOSE 85 126*  BUN 11 8  CREATININE 0.81 0.58*  CALCIUM 7.4* 7.1*   No results for input(s): LABPT, INR in the last 72 hours. No results for input(s): LABURIN in the last 72 hours. Results for orders placed or performed during the hospital encounter of 04/10/15  Culture, Urine     Status: None   Collection Time: 04/10/15  3:32 PM  Result Value Ref Range Status   Specimen Description URINE, CLEAN CATCH  Final   Special Requests NONE  Final   Culture   Final    2,000 COLONIES/mL INSIGNIFICANT GROWTH Performed at [facility]    Report Status 04/11/2015 FINAL  Final    Studies/Results: Ct Abdomen Pelvis W Contrast  04/11/2015   CLINICAL DATA:  Changes small  bowel obstruction on recent plain film examination.  EXAM: CT ABDOMEN AND PELVIS WITH CONTRAST  TECHNIQUE: Multidetector CT imaging of the abdomen and pelvis was performed using the standard protocol following bolus administration of intravenous contrast.  CONTRAST:  198mL OMNIPAQUE IOHEXOL 300 MG/ML  SOLN  COMPARISON:  03/12/2015  FINDINGS: Lung bases demonstrate mild bibasilar atelectasis of an acute nature and small pleural effusions. The liver, gallbladder, spleen, adrenal glands and pancreas are within normal limits. The kidneys are well visualized bilaterally with the ureteral stents in place. No obstructive changes are noted. The ureteral stents extend into an ileostomy in the right mid abdomen.  Diffuse small bowel dilatation involving the jejunum and proximal ileum is identified. One of the loops courses into a a small area of herniation just below the recent surgical incision although no incarceration is noted. A transition zone lies in the right mid abdomen adjacent to the urostomy loop. Given the relative recent surgery this is not felt to represent adhesions the more distal small bowel is within normal limits. The colon demonstrates some mild focal areas of wall thickening in the postoperative site likely related to postoperative inflammatory change. A minimal amount of free pelvic fluid is seen. The bladder has been surgically removed. No significant lymphadenopathy is seen. The osseous structures show no acute abnormality.  IMPRESSION: Changes consistent with small bowel dilatation secondary to a transition zone in  the distal ileum adjacent to the patient's known urostomy loop.  Small herniation beneath the recent surgical incision in the midline although no incarceration is noted.  Mild free pelvic fluid within the pelvis likely postoperative in nature. No focal abscess is identified.  Mild acute basilar atelectasis and small associated effusions.   Electronically Signed   By: Inez Catalina M.D.   On:  04/11/2015 14:15    Assessment/Plan: He remains stable with what appears to be a resolved partial SBO. He is tolerating full liquids with not much appetite but we discussed advancing him to a regular diet and he indicated he didn't really feel like eating but asked if he could have some crackers later today if he felt like eating some. I told him that that would be okay. His wound does not appear to be infected but he is draining clear fluid which is not excessive and appears to be controlled with his dressing. I will continue with dressing changes for now. He indicated that he has been receiving oral and rectal laxity is on a scheduled basis. I will back this off to PRN. His white count remains elevated but his lower today than it was yesterday. His hemoglobin remained stable. Electrolytes look good.   1. Dressing change. 2. Change laxitives to PRN. 3. Continue TPN and full liquids with crackers for now.   LOS: 3 days   OTTELIN,MARK C 04/13/2015, 8:15 AM

## 2015-04-13 NOTE — Evaluation (Signed)
Physical Therapy One Time Evaluation Patient Details Name: KENNETT SYMES MRN: [id number] DOB: [birth year] Today's Date: 04/13/2015   History of Present Illness  59 y.o. male with recent hx of open radical cystectomy and ileal conduit on 04/01/15 due to bladder cancer and readmitted for nausea and vomiting thought to be from partial SBO on 04/10/15.  Clinical Impression  Patient evaluated by Physical Therapy with no further acute PT needs identified. All education has been completed and the patient has no further questions.  Pt familiar to this PT from recent admission.  Pt reports no issues with mobility after last discharge and continues to present with good mobility this admission.  Encouraged pt to at least ambulate 5x/day during acute stay.  Pt is agreeable to no follow-up Physical Therapy or equipment needs. PT is signing off. Thank you for this referral.     Follow Up Recommendations No PT follow up    Equipment Recommendations  None recommended by PT    Recommendations for Other Services       Precautions / Restrictions Precautions Precautions: Fall Precaution Comments: urostomy      Mobility  Bed Mobility Overal bed mobility: Modified Independent                Transfers Overall transfer level: Needs assistance Equipment used: None Transfers: Sit to/from Stand Sit to Stand: Supervision            Ambulation/Gait Ambulation/Gait assistance: Supervision Ambulation Distance (Feet): 400 Feet Assistive device: Rolling walker (2 wheeled) Gait Pattern/deviations: Step-through pattern;Trunk flexed     General Gait Details: tends to remain in trunk flexion, encouraged proper posture, used RW today however not necessarily required for balance (no significant weight bearing on UEs)  Stairs            Wheelchair Mobility    Modified Rankin (Stroke Patients Only)       Balance                                             Pertinent  Vitals/Pain Pain Assessment: No/denies pain    Home Living Family/patient expects to be discharged to:: Private residence Living Arrangements: Spouse/significant other Available Help at Discharge: Family;Available 24 hours/day Type of Home: House       Home Layout: Able to live on main level with bedroom/bathroom Home Equipment: None      Prior Function Level of Independence: Independent               Hand Dominance        Extremity/Trunk Assessment   Upper Extremity Assessment: Overall WFL for tasks assessed           Lower Extremity Assessment: Overall WFL for tasks assessed      Cervical / Trunk Assessment: Kyphotic  Communication   Communication: No difficulties  Cognition Arousal/Alertness: Awake/alert Behavior During Therapy: WFL for tasks assessed/performed Overall Cognitive Status: Within Functional Limits for tasks assessed                      General Comments      Exercises        Assessment/Plan    PT Assessment Patent does not need any further PT services  PT Diagnosis     PT Problem List    PT Treatment Interventions     PT Goals (Current goals  can be found in the Care Plan section) Acute Rehab PT Goals PT Goal Formulation: All assessment and education complete, DC therapy    Frequency     Barriers to discharge        Co-evaluation               End of Session   Activity Tolerance: Patient tolerated treatment well Patient left: in chair;with call bell/phone within reach Nurse Communication: Mobility status         Time: 8299-3716 PT Time Calculation (min) (ACUTE ONLY): 16 min   Charges:   PT Evaluation $Initial PT Evaluation Tier I: 1 Procedure     PT G Codes:        Cloris Flippo,KATHrine E 04/13/2015, 1:52 PM  Carmelia Bake, PT, DPT 04/13/2015 Pager: [phone]

## 2015-04-14 LAB — GLUCOSE, CAPILLARY
GLUCOSE-CAPILLARY: 117 mg/dL — AB (ref 65–99)
GLUCOSE-CAPILLARY: 136 mg/dL — AB (ref 65–99)
Glucose-Capillary: 116 mg/dL — ABNORMAL HIGH (ref 65–99)
Glucose-Capillary: 134 mg/dL — ABNORMAL HIGH (ref 65–99)
Glucose-Capillary: 149 mg/dL — ABNORMAL HIGH (ref 65–99)
Glucose-Capillary: 160 mg/dL — ABNORMAL HIGH (ref 65–99)

## 2015-04-14 LAB — CBC
HEMATOCRIT: 31.6 % — AB (ref 39.0–52.0)
HEMOGLOBIN: 10.8 g/dL — AB (ref 13.0–17.0)
MCH: 31.1 pg (ref 26.0–34.0)
MCHC: 34.2 g/dL (ref 30.0–36.0)
MCV: 91.1 fL (ref 78.0–100.0)
Platelets: 423 10*3/uL — ABNORMAL HIGH (ref 150–400)
RBC: 3.47 MIL/uL — AB (ref 4.22–5.81)
RDW: 15 % (ref 11.5–15.5)
WBC: 20.4 10*3/uL — ABNORMAL HIGH (ref 4.0–10.5)

## 2015-04-14 LAB — BASIC METABOLIC PANEL
Anion gap: 8 (ref 5–15)
BUN: 6 mg/dL (ref 6–20)
CHLORIDE: 96 mmol/L — AB (ref 101–111)
CO2: 22 mmol/L (ref 22–32)
CREATININE: 0.54 mg/dL — AB (ref 0.61–1.24)
Calcium: 7.3 mg/dL — ABNORMAL LOW (ref 8.9–10.3)
GFR calc Af Amer: >60 mL/min (ref >60)
GFR calc non Af Amer: >60 mL/min (ref >60)
Glucose, Bld: 127 mg/dL — ABNORMAL HIGH (ref 65–99)
POTASSIUM: 3.5 mmol/L (ref 3.5–5.1)
SODIUM: 126 mmol/L — AB (ref 135–145)

## 2015-04-14 LAB — MAGNESIUM: MAGNESIUM: 1.8 mg/dL (ref 1.7–2.4)

## 2015-04-14 LAB — PHOSPHORUS: Phosphorus: 2.7 mg/dL (ref 2.5–4.6)

## 2015-04-14 MED ORDER — ACETAMINOPHEN 10 MG/ML IV SOLN
1000.0000 mg | Freq: Four times a day (QID) | INTRAVENOUS | Status: AC
  Administered 2015-04-14 – 2015-04-15 (×4): 1000 mg via INTRAVENOUS
  Filled 2015-04-14 (×4): qty 100

## 2015-04-14 MED ORDER — TRACE MINERALS CR-CU-MN-SE-ZN 10-1000-500-60 MCG/ML IV SOLN
INTRAVENOUS | Status: AC
  Administered 2015-04-14: 17:00:00 via INTRAVENOUS
  Filled 2015-04-14: qty 1440

## 2015-04-14 MED ORDER — SODIUM CHLORIDE 0.9 % IV SOLN
25.0000 mg | Freq: Three times a day (TID) | INTRAVENOUS | Status: DC | PRN
  Administered 2015-04-14 – 2015-04-15 (×3): 25 mg via INTRAVENOUS
  Filled 2015-04-14 (×6): qty 1

## 2015-04-14 MED ORDER — SODIUM CHLORIDE 0.9 % IV SOLN
INTRAVENOUS | Status: DC
  Administered 2015-04-14 – 2015-04-17 (×3): via INTRAVENOUS

## 2015-04-14 MED ORDER — FAT EMULSION 20 % IV EMUL
240.0000 mL | INTRAVENOUS | Status: AC
  Administered 2015-04-14: 240 mL via INTRAVENOUS
  Filled 2015-04-14: qty 250

## 2015-04-14 NOTE — Progress Notes (Signed)
  Subjective: Patient reports that he had a restless night but is not having any pain in the abdomen. He does report having redeveloped hiccups and believes that this then resulted in an episode of emesis this morning. His appetite remains pretty much the same.  Objective: Vital signs in last 24 hours: Temp:  [97.6 F (36.4 C)-99.2 F (37.3 C)] 99.2 F (37.3 C) (09/25 0552) Pulse Rate:  [93-97] 96 (09/25 0552) Resp:  [18-20] 20 (09/25 0552) BP: (140-142)/(69-78) 142/78 mmHg (09/25 0552) SpO2:  [99 %-100 %] 100 % (09/25 0552)  Intake/Output from previous day: 09/24 0701 - 09/25 0700 In: 2016.7 [P.O.:360; I.V.:300; IV Piggyback:956.7; TPN:400] Out: 2925 [Urine:2925] Intake/Output this shift:    Physical Exam:  Ostomy is pink and viable with stents in place and the urine in his bag is clear. His abdomen is nondistended. The incision has staples that are intact and is draining clear fluid from the inferior aspect.   Lab Results:  Recent Labs  04/12/15 0358 04/13/15 0507 04/14/15 0515  HGB 9.9* 9.9* 10.8*  HCT 29.1* 29.3* 31.6*   BMET  Recent Labs  04/13/15 0507 04/14/15 0515  NA 131* 126*  K 3.0* 3.5  CL 102 96*  CO2 24 22  GLUCOSE 126* 127*  BUN 8 6  CREATININE 0.58* 0.54*  CALCIUM 7.1* 7.3*   No results for input(s): LABPT, INR in the last 72 hours. No results for input(s): LABURIN in the last 72 hours. Results for orders placed or performed during the hospital encounter of 04/10/15  Culture, Urine     Status: None   Collection Time: 04/10/15  3:32 PM  Result Value Ref Range Status   Specimen Description URINE, CLEAN CATCH  Final   Special Requests NONE  Final   Culture   Final    2,000 COLONIES/mL INSIGNIFICANT GROWTH Performed at [facility]    Report Status 04/11/2015 FINAL  Final    Studies/Results: No results found.  Assessment/Plan: Overall he seems to be remaining stable but continues to have difficulty with hiccups. I have reordered  his when necessary Thorazine as this has been effective for him previously. He continues to drain fluid from the inferior aspect of his incision but there is no sign of infection and the staples remain intact. Due to the fact that the fluid is draining from the incision I don't think placing any form of ostomy bag would control this any better and I certainly don't think a wound VAC is appropriate due to the persistent drainage at this time. I don't know if he may benefit from a wound and ostomy nurse evaluation for the drainage. He does have some hyponatremia and hypochloremia which will need to be addressed with adjustments to his TPN by pharmacy.  Continue TPN  Dressing changes to wound as needed.   Thorazine.     LOS: 4 days   OTTELIN,MARK C 04/14/2015, 8:13 AM

## 2015-04-14 NOTE — Progress Notes (Signed)
PARENTERAL NUTRITION CONSULT NOTE - INITIAL  Pharmacy Consult for TPN Indication: partial SBO, Cystectomy with Ileal conduit 9/12  No Known Allergies  Patient Measurements: Height: 5\' 6"  (167.6 cm) Weight: 148 lb (67.132 kg) IBW/kg (Calculated) : 63.8  Vital Signs: Temp: 99.2 F (37.3 C) (09/25 0552) Temp Source: Oral (09/25 0552) BP: 142/78 mmHg (09/25 0552) Pulse Rate: 96 (09/25 0552) Intake/Output from previous day: 09/24 0701 - 09/25 0700 In: 2016.7 [P.O.:360; I.V.:300; IV Piggyback:956.7; TPN:400] Out: 2925 [Urine:2925] Intake/Output from this shift:    Labs:  Recent Labs  04/12/15 0358 04/13/15 0507 04/14/15 0515  WBC 23.0* 20.8* 20.4*  HGB 9.9* 9.9* 10.8*  HCT 29.1* 29.3* 31.6*  PLT 449* 453* 423*     Recent Labs  04/12/15 0358 04/12/15 1630 04/13/15 0507 04/13/15 0915 04/14/15 0515  NA 135  --  131*  --  126*  K 3.6  --  3.0*  --  3.5  CL 101  --  102  --  96*  CO2 24  --  24  --  22  GLUCOSE 85  --  126*  --  127*  BUN 11  --  8  --  6  CREATININE 0.81  --  0.58*  --  0.54*  CALCIUM 7.4*  --  7.1*  --  7.3*  MG  --  1.5* 1.9  --  1.8  PHOS  --  2.6 2.3*  --  2.7  PROT  --   --  4.9*  --   --   ALBUMIN  --   --  1.9*  --   --   AST  --   --  24  --   --   ALT  --   --  16*  --   --   ALKPHOS  --   --  54  --   --   BILITOT  --   --  0.3  --   --   PREALBUMIN  --   --   --  4.8*  --   TRIG  --   --  137  --   --    Estimated Creatinine Clearance: 88.6 mL/min (by C-G formula based on Cr of 0.54).    Recent Labs  04/13/15 2015 04/14/15 0022 04/14/15 0410  GLUCAP 130* 160* 117*   Medical History: Past Medical History  Diagnosis Date  . Arthritis   . Hematuria   . Lower urinary tract symptoms (LUTS)   . Prostate cancer urology--  dr herrick/ dr Valere Dross    DX 2014 ---  PSA 15.2,  Gleason 6/7,  vol 28cc,  s/p radical prostatectomy and external beam radiation therapy--  recurrent , PSA 0.04 (12-28-2014)  . Nocturia   . Urinary  frequency    Medications:  Scheduled:  . sodium chloride   Intravenous Once  . bariatric protein shake  2 oz Oral TID WC  . cefTRIAXone (ROCEPHIN)  IV  1 g Intravenous Q24H  . enoxaparin (LOVENOX) injection  40 mg Subcutaneous Q24H  . insulin aspart  0-9 Units Subcutaneous 6 times per day  . pantoprazole (PROTONIX) IV  40 mg Intravenous Q12H  . sodium chloride  1,000 mL Intravenous Once  . sodium chloride  10-40 mL Intracatheter Q12H   Infusions:  . sodium chloride 75 mL/hr at 04/13/15 0918  . Marland KitchenTPN (CLINIMIX-E) Adult 40 mL/hr at 04/13/15 1719   And  . fat emulsion 240 mL (04/13/15 1719)   Insulin Requirements: 3  units/24 hr; sensitive Novolog scale q4hr CBG's ~130   Current Nutrition: FL diet, Bariatric Protein shake ordered tid with meals - not tolerating protein shake  IVF: NS at 2ml/hr  Central access: PICC  TPN start date: 9/23  ASSESSMENT                                                                                                          59yo M s/p cystectomy w/ ileul conduit on 04/01/15 for bladder cancer, admitted 04/10/15 w/ increasing nausea over the past 3 days, constipation for over a week, and cellulitis & drainage at his midline incision.  CT showed partial SBO at the site of the anastomosis. Pharmacy is consulted to begin parenteral nutrition.  Significant events:  9/23: Replet Magnesium 9/24: repleting K & Phos, passing flatus, stool  Today:   Glucose - 127 this am. Goal while TPN <150.   Electrolytes -Na 126 (cannot adjust in TPN), K 3.5 (K runs 9/24), Mag 1.8 (2gm bolus 9/23 pm), Phos 2.7 (K Phos bolus 9/24), Corr Ca 8.7  Renal - SCr wnl, UOP 1.5 ml/kg/hr  LFTs - wnl  TGs - 137 (9/24)  Prealbumin - 4.8 (9/24)  NUTRITIONAL GOALS                                                                                             RD recs: 70-80g/day protein, 1700-2000Kcal/day. Clinimix 5/15 at a goal rate of 77ml/hr + 20% fat emulsion at 109ml/hr to  provide: 96g/day protein, 1843Kcal/day.  PLAN                                                                                                                         At 1800 today:  Increase Clinimix E 5/15 to 67ml/hr  20% fat emulsion at 57ml/hr.  Plan to advance as tolerated to the goal rate  TPN to contain standard multivitamins and trace elements.  Reduce IVF of NS to 31ml/hr.  Sensitive Novolog SSI q4h.   TPN lab panels on Mondays & Thursdays.  F/u daily.  Minda Ditto PharmD Pager [phone] 04/14/2015, 7:29 AM

## 2015-04-14 NOTE — Progress Notes (Signed)
Pt offered to walk around unit and pt declined. Pt stated, "I want to wait to talk with my doctors in morning to see if I can get my tylenol added."  Dressing changed X 3 overnight. Pt tolerated well. Will continue to monitor.

## 2015-04-15 ENCOUNTER — Encounter (HOSPITAL_COMMUNITY)
Admission: EM | Disposition: A | Discharge status: Home Health | Payer: Self-pay | Source: Home / Self Care | Attending: Urology

## 2015-04-15 ENCOUNTER — Inpatient Hospital Stay (HOSPITAL_COMMUNITY): Payer: BLUE CROSS/BLUE SHIELD | Admitting: Anesthesiology

## 2015-04-15 ENCOUNTER — Encounter (HOSPITAL_COMMUNITY): Payer: Self-pay | Admitting: Anesthesiology

## 2015-04-15 ENCOUNTER — Inpatient Hospital Stay (HOSPITAL_COMMUNITY): Payer: BLUE CROSS/BLUE SHIELD

## 2015-04-15 HISTORY — PX: IRRIGATION AND DEBRIDEMENT ABSCESS: SHX5252

## 2015-04-15 LAB — DIFFERENTIAL
BASOS PCT: 0 %
Basophils Absolute: 0 10*3/uL (ref 0.0–0.1)
EOS PCT: 1 %
Eosinophils Absolute: 0.2 10*3/uL (ref 0.0–0.7)
Lymphocytes Relative: 3 %
Lymphs Abs: 0.5 10*3/uL — ABNORMAL LOW (ref 0.7–4.0)
MONO ABS: 1.7 10*3/uL — AB (ref 0.1–1.0)
MONOS PCT: 11 %
Neutro Abs: 12.6 10*3/uL — ABNORMAL HIGH (ref 1.7–7.7)
Neutrophils Relative %: 85 %

## 2015-04-15 LAB — COMPREHENSIVE METABOLIC PANEL
ALT: 15 U/L — AB (ref 17–63)
AST: 22 U/L (ref 15–41)
Albumin: 1.8 g/dL — ABNORMAL LOW (ref 3.5–5.0)
Alkaline Phosphatase: 54 U/L (ref 38–126)
Anion gap: 6 (ref 5–15)
BUN: 7 mg/dL (ref 6–20)
CHLORIDE: 98 mmol/L — AB (ref 101–111)
CO2: 24 mmol/L (ref 22–32)
CREATININE: 0.64 mg/dL (ref 0.61–1.24)
Calcium: 7.3 mg/dL — ABNORMAL LOW (ref 8.9–10.3)
Glucose, Bld: 152 mg/dL — ABNORMAL HIGH (ref 65–99)
Potassium: 3.3 mmol/L — ABNORMAL LOW (ref 3.5–5.1)
Sodium: 128 mmol/L — ABNORMAL LOW (ref 135–145)
Total Bilirubin: 0.5 mg/dL (ref 0.3–1.2)
Total Protein: 4.8 g/dL — ABNORMAL LOW (ref 6.5–8.1)

## 2015-04-15 LAB — CBC
HCT: 28.2 % — ABNORMAL LOW (ref 39.0–52.0)
Hemoglobin: 9.5 g/dL — ABNORMAL LOW (ref 13.0–17.0)
MCH: 30.9 pg (ref 26.0–34.0)
MCHC: 33.7 g/dL (ref 30.0–36.0)
MCV: 91.9 fL (ref 78.0–100.0)
PLATELETS: 343 10*3/uL (ref 150–400)
RBC: 3.07 MIL/uL — AB (ref 4.22–5.81)
RDW: 15 % (ref 11.5–15.5)
WBC: 15 10*3/uL — ABNORMAL HIGH (ref 4.0–10.5)

## 2015-04-15 LAB — GLUCOSE, CAPILLARY
GLUCOSE-CAPILLARY: 122 mg/dL — AB (ref 65–99)
GLUCOSE-CAPILLARY: 126 mg/dL — AB (ref 65–99)
GLUCOSE-CAPILLARY: 127 mg/dL — AB (ref 65–99)
GLUCOSE-CAPILLARY: 157 mg/dL — AB (ref 65–99)
Glucose-Capillary: 115 mg/dL — ABNORMAL HIGH (ref 65–99)

## 2015-04-15 LAB — PHOSPHORUS: PHOSPHORUS: 3 mg/dL (ref 2.5–4.6)

## 2015-04-15 LAB — MRSA PCR SCREENING: MRSA BY PCR: NEGATIVE

## 2015-04-15 LAB — MAGNESIUM: MAGNESIUM: 1.7 mg/dL (ref 1.7–2.4)

## 2015-04-15 LAB — TRIGLYCERIDES: Triglycerides: 119 mg/dL (ref <150)

## 2015-04-15 LAB — PREALBUMIN: Prealbumin: 6.9 mg/dL — ABNORMAL LOW (ref 18–38)

## 2015-04-15 SURGERY — IRRIGATION AND DEBRIDEMENT ABSCESS
Anesthesia: General | Site: Abdomen

## 2015-04-15 MED ORDER — LACTATED RINGERS IV SOLN
INTRAVENOUS | Status: DC | PRN
  Administered 2015-04-15: 16:00:00 via INTRAVENOUS

## 2015-04-15 MED ORDER — SODIUM CHLORIDE 0.9 % IR SOLN
Status: AC
  Filled 2015-04-15: qty 1

## 2015-04-15 MED ORDER — TRACE MINERALS CR-CU-MN-SE-ZN 10-1000-500-60 MCG/ML IV SOLN: INTRAVENOUS | Status: DC

## 2015-04-15 MED ORDER — SODIUM CHLORIDE 0.9 % IV SOLN
INTRAVENOUS | Status: DC | PRN
  Administered 2015-04-15 (×2): via INTRAVENOUS

## 2015-04-15 MED ORDER — ALBUMIN HUMAN 5 % IV SOLN
INTRAVENOUS | Status: DC | PRN
  Administered 2015-04-15: 17:00:00 via INTRAVENOUS

## 2015-04-15 MED ORDER — FAT EMULSION 20 % INFUSION TNA - OPTIME
INTRAVENOUS | Status: DC | PRN
  Administered 2015-04-15: 10 mL/h via INTRAVENOUS

## 2015-04-15 MED ORDER — FAT EMULSION 20 % IV EMUL
240.0000 mL | INTRAVENOUS | Status: AC
  Administered 2015-04-15: 240 mL via INTRAVENOUS
  Filled 2015-04-15: qty 250

## 2015-04-15 MED ORDER — PROPOFOL 10 MG/ML IV BOLUS
INTRAVENOUS | Status: DC | PRN
  Administered 2015-04-15: 120 mg via INTRAVENOUS

## 2015-04-15 MED ORDER — PROMETHAZINE HCL 25 MG/ML IJ SOLN: 6.2500 mg | INTRAMUSCULAR | Status: DC | PRN

## 2015-04-15 MED ORDER — TRACE MINERALS CR-CU-MN-SE-ZN 10-1000-500-60 MCG/ML IV SOLN
INTRAVENOUS | Status: AC
  Administered 2015-04-15: 21:00:00 via INTRAVENOUS
  Filled 2015-04-15: qty 1560

## 2015-04-15 MED ORDER — MORPHINE SULFATE (PF) 2 MG/ML IV SOLN
2.0000 mg | INTRAVENOUS | Status: DC | PRN
  Administered 2015-04-16 – 2015-04-17 (×7): 2 mg via INTRAVENOUS
  Filled 2015-04-15 (×7): qty 1

## 2015-04-15 MED ORDER — MUPIROCIN 2 % EX OINT
TOPICAL_OINTMENT | Freq: Two times a day (BID) | CUTANEOUS | Status: DC
Start: 2015-04-15 — End: 2015-04-20
  Administered 2015-04-16 – 2015-04-20 (×9): via NASAL
  Filled 2015-04-15: qty 22

## 2015-04-15 MED ORDER — LIDOCAINE HCL (CARDIAC) 20 MG/ML IV SOLN
INTRAVENOUS | Status: DC | PRN
  Administered 2015-04-15: 75 mg via INTRAVENOUS

## 2015-04-15 MED ORDER — CALCIUM CARBONATE ANTACID 500 MG PO CHEW
2.0000 | CHEWABLE_TABLET | Freq: Every day | ORAL | Status: DC
  Administered 2015-04-16 – 2015-04-20 (×5): 400 mg via ORAL
  Filled 2015-04-15 (×5): qty 2

## 2015-04-15 MED ORDER — FAT EMULSION 20 % IV EMUL: 240.0000 mL | INTRAVENOUS | Status: DC

## 2015-04-15 MED ORDER — FENTANYL CITRATE (PF) 250 MCG/5ML IJ SOLN
INTRAMUSCULAR | Status: AC
  Filled 2015-04-15: qty 25

## 2015-04-15 MED ORDER — HYDROMORPHONE HCL 1 MG/ML IJ SOLN: 0.2500 mg | INTRAMUSCULAR | Status: DC | PRN

## 2015-04-15 MED ORDER — ACETAMINOPHEN 10 MG/ML IV SOLN
INTRAVENOUS | Status: AC
  Filled 2015-04-15: qty 100

## 2015-04-15 MED ORDER — PROPOFOL 10 MG/ML IV BOLUS
INTRAVENOUS | Status: AC
  Filled 2015-04-15: qty 20

## 2015-04-15 MED ORDER — MUPIROCIN 2 % EX OINT: TOPICAL_OINTMENT | Freq: Two times a day (BID) | CUTANEOUS | Status: DC

## 2015-04-15 MED ORDER — PHENYLEPHRINE 40 MCG/ML (10ML) SYRINGE FOR IV PUSH (FOR BLOOD PRESSURE SUPPORT)
PREFILLED_SYRINGE | INTRAVENOUS | Status: AC
  Filled 2015-04-15: qty 10

## 2015-04-15 MED ORDER — ACETAMINOPHEN 10 MG/ML IV SOLN
1000.0000 mg | Freq: Four times a day (QID) | INTRAVENOUS | Status: AC
  Administered 2015-04-16 (×3): 1000 mg via INTRAVENOUS
  Filled 2015-04-15 (×4): qty 100

## 2015-04-15 MED ORDER — LIDOCAINE HCL (CARDIAC) 20 MG/ML IV SOLN
INTRAVENOUS | Status: AC
  Filled 2015-04-15: qty 5

## 2015-04-15 MED ORDER — ACETAMINOPHEN 10 MG/ML IV SOLN
INTRAVENOUS | Status: DC | PRN
  Administered 2015-04-15: 1000 mg via INTRAVENOUS

## 2015-04-15 MED ORDER — CALCIUM CARBONATE ANTACID 750 MG PO CHEW: 1.0000 | CHEWABLE_TABLET | Freq: Every day | ORAL | Status: DC

## 2015-04-15 MED ORDER — PHENYLEPHRINE HCL 10 MG/ML IJ SOLN
INTRAMUSCULAR | Status: DC | PRN
  Administered 2015-04-15 (×2): 80 ug via INTRAVENOUS

## 2015-04-15 MED ORDER — ONDANSETRON HCL 4 MG/2ML IJ SOLN
INTRAMUSCULAR | Status: DC | PRN
  Administered 2015-04-15: 4 mg via INTRAVENOUS

## 2015-04-15 MED ORDER — SUGAMMADEX SODIUM 200 MG/2ML IV SOLN
INTRAVENOUS | Status: DC | PRN
  Administered 2015-04-15: 100 mg via INTRAVENOUS

## 2015-04-15 MED ORDER — MAGNESIUM SULFATE 2 GM/50ML IV SOLN: 2.0000 g | Freq: Once | INTRAVENOUS | Status: DC

## 2015-04-15 MED ORDER — ROCURONIUM BROMIDE 100 MG/10ML IV SOLN
INTRAVENOUS | Status: DC | PRN
  Administered 2015-04-15: 10 mg via INTRAVENOUS
  Administered 2015-04-15: 25 mg via INTRAVENOUS

## 2015-04-15 MED ORDER — ONDANSETRON HCL 4 MG/2ML IJ SOLN
INTRAMUSCULAR | Status: AC
  Filled 2015-04-15: qty 2

## 2015-04-15 MED ORDER — 0.9 % SODIUM CHLORIDE (POUR BTL) OPTIME
TOPICAL | Status: DC | PRN
  Administered 2015-04-15: 200 mL

## 2015-04-15 MED ORDER — KETOROLAC TROMETHAMINE 30 MG/ML IJ SOLN
30.0000 mg | Freq: Four times a day (QID) | INTRAMUSCULAR | Status: DC | PRN
  Administered 2015-04-15 – 2015-04-18 (×3): 30 mg via INTRAVENOUS
  Filled 2015-04-15 (×3): qty 1

## 2015-04-15 MED ORDER — ONDANSETRON HCL 4 MG/5ML PO SOLN: 4.0000 mg | Freq: Once | ORAL | Status: DC

## 2015-04-15 MED ORDER — SUGAMMADEX SODIUM 200 MG/2ML IV SOLN
INTRAVENOUS | Status: AC
  Filled 2015-04-15: qty 2

## 2015-04-15 MED ORDER — SUCCINYLCHOLINE CHLORIDE 20 MG/ML IJ SOLN
INTRAMUSCULAR | Status: DC | PRN
  Administered 2015-04-15: 80 mg via INTRAVENOUS

## 2015-04-15 MED ORDER — FENTANYL CITRATE (PF) 100 MCG/2ML IJ SOLN
INTRAMUSCULAR | Status: DC | PRN
  Administered 2015-04-15 (×10): 50 ug via INTRAVENOUS

## 2015-04-15 MED ORDER — SODIUM CHLORIDE 0.9 % IR SOLN
Status: DC | PRN
  Administered 2015-04-15: 500 mL

## 2015-04-15 MED ORDER — POTASSIUM CHLORIDE 10 MEQ/50ML IV SOLN
10.0000 meq | INTRAVENOUS | Status: DC
  Administered 2015-04-15 (×3): 10 meq via INTRAVENOUS
  Filled 2015-04-15 (×4): qty 50

## 2015-04-15 MED ORDER — SODIUM CHLORIDE 0.9 % IV SOLN
6.2500 mg | Freq: Three times a day (TID) | INTRAVENOUS | Status: DC | PRN
  Administered 2015-04-15 – 2015-04-20 (×12): 6.25 mg via INTRAVENOUS
  Filled 2015-04-15 (×22): qty 0.25

## 2015-04-15 SURGICAL SUPPLY — 53 items
BAG URINE DRAINAGE (UROLOGICAL SUPPLIES) ×6 IMPLANT
BLADE EXTENDED COATED 6.5IN (ELECTRODE) IMPLANT
BLADE HEX COATED 2.75 (ELECTRODE) ×3 IMPLANT
CATH FOLEY 2WAY SLVR  5CC 20FR (CATHETERS)
CATH FOLEY 2WAY SLVR 5CC 20FR (CATHETERS) IMPLANT
CATH ROBINSON RED A/P 16FR (CATHETERS) ×3 IMPLANT
CHLORAPREP W/TINT 26ML (MISCELLANEOUS) IMPLANT
CLIP LIGATING HEM O LOK PURPLE (MISCELLANEOUS) IMPLANT
CLIP LIGATING HEMO O LOK GREEN (MISCELLANEOUS) IMPLANT
COVER SURGICAL LIGHT HANDLE (MISCELLANEOUS) IMPLANT
DECANTER SPIKE VIAL GLASS SM (MISCELLANEOUS) IMPLANT
DISSECTOR ROUND CHERRY 3/8 STR (MISCELLANEOUS) IMPLANT
DRAIN CHANNEL 10F 3/8 F FF (DRAIN) IMPLANT
DRAPE LAPAROTOMY T 102X78X121 (DRAPES) ×3 IMPLANT
DRAPE TABLE BACK 44X90 PK DISP (DRAPES) IMPLANT
DRAPE WARM FLUID 44X44 (DRAPE) IMPLANT
DRSG PAD ABDOMINAL 8X10 ST (GAUZE/BANDAGES/DRESSINGS) ×3 IMPLANT
ELECT REM PT RETURN 9FT ADLT (ELECTROSURGICAL) ×3
ELECTRODE REM PT RTRN 9FT ADLT (ELECTROSURGICAL) ×1 IMPLANT
EVACUATOR SILICONE 100CC (DRAIN) IMPLANT
GAUZE SPONGE 4X4 12PLY STRL (GAUZE/BANDAGES/DRESSINGS) ×3 IMPLANT
GAUZE SPONGE 4X4 16PLY XRAY LF (GAUZE/BANDAGES/DRESSINGS) IMPLANT
GLOVE BIOGEL M 8.0 STRL (GLOVE) ×3 IMPLANT
GLOVE BIOGEL M STRL SZ7.5 (GLOVE) ×3 IMPLANT
GOWN STRL REUS W/TWL XL LVL3 (GOWN DISPOSABLE) ×3 IMPLANT
KIT BASIN OR (CUSTOM PROCEDURE TRAY) ×3 IMPLANT
LOOP VESSEL MAXI BLUE (MISCELLANEOUS) IMPLANT
LUBRICANT JELLY K Y 4OZ (MISCELLANEOUS) IMPLANT
NS IRRIG 1000ML POUR BTL (IV SOLUTION) ×6 IMPLANT
PACK GENERAL/GYN (CUSTOM PROCEDURE TRAY) ×3 IMPLANT
PLUG CATH AND CAP STER (CATHETERS) IMPLANT
SPONGE LAP 18X18 X RAY DECT (DISPOSABLE) ×9 IMPLANT
SPONGE LAP 4X18 X RAY DECT (DISPOSABLE) IMPLANT
STAPLER VISISTAT 35W (STAPLE) ×6 IMPLANT
SUT CHROMIC 2 0 SH (SUTURE) IMPLANT
SUT CHROMIC 3 0 SH 27 (SUTURE) IMPLANT
SUT ETHILON 3 0 PS 1 (SUTURE) IMPLANT
SUT PDS AB 1 CTX 36 (SUTURE) IMPLANT
SUT PDS AB 1 TP1 54 (SUTURE) ×3 IMPLANT
SUT PROLENE 1 (SUTURE) ×6 IMPLANT
SUT SILK 0 (SUTURE)
SUT SILK 0 30XBRD TIE 6 (SUTURE) IMPLANT
SUT SILK 2 0 (SUTURE)
SUT SILK 2-0 30XBRD TIE 12 (SUTURE) IMPLANT
SUT VIC AB 1 CT1 27 (SUTURE)
SUT VIC AB 1 CT1 27XBRD ANTBC (SUTURE) IMPLANT
SUT VIC AB 3-0 SH 27 (SUTURE) ×2
SUT VIC AB 3-0 SH 27X BRD (SUTURE) ×1 IMPLANT
SYRINGE 10CC LL (SYRINGE) IMPLANT
SYSTEM UROSTOMY GENTLE TOUCH (WOUND CARE) ×3 IMPLANT
TAPE CLOTH SURG 6X10 WHT LF (GAUZE/BANDAGES/DRESSINGS) ×3 IMPLANT
TAPE UMBILICAL COTTON 1/8X30 (MISCELLANEOUS) IMPLANT
WATER STERILE IRR 1500ML POUR (IV SOLUTION) IMPLANT

## 2015-04-15 NOTE — Transfer of Care (Signed)
Immediate Anesthesia Transfer of Care Note  Patient: Howard Valentine  Procedure(s) Performed: Procedure(s): CLOSURE OF WOUND DEHISCENCE (N/A)  Patient Location: PACU  Anesthesia Type:General  Level of Consciousness: awake, alert , oriented and patient cooperative  Airway & Oxygen Therapy: Patient Spontanous Breathing and Patient connected to face mask oxygen  Post-op Assessment: Report given to RN, Post -op Vital signs reviewed and stable and Patient moving all extremities X 4  Post vital signs: stable  Last Vitals:  Filed Vitals:   04/15/15 1906  BP: 131/74  Pulse: 111  Temp:   Resp: 11    Complications: No apparent anesthesia complications

## 2015-04-15 NOTE — Progress Notes (Signed)
Nutrition Follow-up  INTERVENTION:   TPN per Pharmacy Continue Premier Protein shake Encourage PO intake RD to continue to monitor  NUTRITION DIAGNOSIS:   Inadequate oral intake related to inability to eat, nausea, vomiting, poor appetite as evidenced by per patient/family report, meal completion < 50%.  Ongoing.  GOAL:   Patient will meet greater than or equal to 90% of their needs  Not meeting yet.  MONITOR:   Diet advancement, PO intake, Supplement acceptance, Labs, I & O's, Skin  REASON FOR ASSESSMENT:   Consult New TPN/TNA  ASSESSMENT:   59 yo male presents for post op dehydration and poor po intake. s/p cystectomy and IC 9/12. Pt was having trouble with po intake and stop having flatus and BMs on 9/20. Pt was exhibting  nausea, vomitting, and a quick sense of fullness with meals. Pt does nto exhibit signs or symptoms of malnutrition. Pt did say ensure fills him up quickly. Pt did not provide a reason for poor appetite now that his nausea and vomitting have cleared up, outside of sense of fullness. MD wants to begin TPN tonight.  Pt to have wound exploration/closure today. Pt continues to have poor appetite and some nausea. New goal for TPN (65 ml/hr), provides 78g/day protein, 1853 kcal/day, 1.56 L fluid/day, 100% of needs.  Plan per Pharmacy: At 1800 today:  Change to Clinimix E 5/20 at 76ml/hr (goal rate)  20% fat emulsion at 57ml/hr.  Labs reviewed: CBGs: 122-157 Low Na, K Mg/Phos WNL  Diet Order:  .TPN (CLINIMIX-E) Adult Diet NPO time specified .TPN (CLINIMIX-E) Adult  Skin:  Wound (see comment) (Stg II Pressure Ulcer to buttocks, surgical incision on abdomen,)  Last BM:  9/26  Height:   Ht Readings from Last 1 Encounters:  04/11/15 5\' 6"  (1.676 m)    Weight:   Wt Readings from Last 1 Encounters:  04/11/15 148 lb (67.132 kg)    Ideal Body Weight:  64.54 kg  BMI:  Body mass index is 23.9 kg/(m^2).  Estimated Nutritional Needs:   Kcal:   1700-2000  Protein:  70-80 grams  Fluid:  >=/ 1.7L  EDUCATION NEEDS:   No education needs identified at this time  Clayton Bibles, MS, RD, LDN Pager: [phone] After Hours Pager: [phone]

## 2015-04-15 NOTE — Anesthesia Procedure Notes (Signed)
Procedure Name: Intubation Date/Time: 04/15/2015 4:50 PM Performed by: Danley Danker L Patient Re-evaluated:Patient Re-evaluated prior to inductionOxygen Delivery Method: Circle system utilized Preoxygenation: Pre-oxygenation with 100% oxygen Intubation Type: IV induction, Rapid sequence and Cricoid Pressure applied Laryngoscope Size: Miller and 3 Grade View: Grade I Tube type: Oral Tube size: 7.5 mm Number of attempts: 1 Airway Equipment and Method: Stylet Placement Confirmation: ETT inserted through vocal cords under direct vision,  breath sounds checked- equal and bilateral and positive ETCO2 Secured at: 21 cm Tube secured with: Tape Dental Injury: Teeth and Oropharynx as per pre-operative assessment

## 2015-04-15 NOTE — Progress Notes (Addendum)
PARENTERAL NUTRITION CONSULT NOTE  Pharmacy Consult for TPN Indication: partial SBO, Cystectomy with Ileal conduit 9/12  No Known Allergies  Patient Measurements: Height: 5\' 6"  (167.6 cm) Weight: 148 lb (67.132 kg) IBW/kg (Calculated) : 63.8  Vital Signs: Temp: 97.7 F (36.5 C) (09/26 0448) Temp Source: Oral (09/26 0448) BP: 125/61 mmHg (09/26 0448) Pulse Rate: 117 (09/26 0448) Intake/Output from previous day: 09/25 0701 - 09/26 0700 In: 0  Out: 2225 [Urine:2225] Intake/Output from this shift:    Labs:  Recent Labs  04/13/15 0507 04/14/15 0515 04/15/15 0455  WBC 20.8* 20.4* 15.0*  HGB 9.9* 10.8* 9.5*  HCT 29.3* 31.6* 28.2*  PLT 453* 423* 343     Recent Labs  04/13/15 0507 04/13/15 0915 04/14/15 0515 04/15/15 0455  NA 131*  --  126* 128*  K 3.0*  --  3.5 3.3*  CL 102  --  96* 98*  CO2 24  --  22 24  GLUCOSE 126*  --  127* 152*  BUN 8  --  6 7  CREATININE 0.58*  --  0.54* 0.64  CALCIUM 7.1*  --  7.3* 7.3*  MG 1.9  --  1.8 1.7  PHOS 2.3*  --  2.7 3.0  PROT 4.9*  --   --  4.8*  ALBUMIN 1.9*  --   --  1.8*  AST 24  --   --  22  ALT 16*  --   --  15*  ALKPHOS 54  --   --  54  BILITOT 0.3  --   --  0.5  PREALBUMIN  --  4.8*  --  6.9*  TRIG 137  --   --  119   Estimated Creatinine Clearance: 88.6 mL/min (by C-G formula based on Cr of 0.64).    Recent Labs  04/15/15 0003 04/15/15 0423 04/15/15 0732  GLUCAP 126* 157* 122*   Medical History: Past Medical History  Diagnosis Date  . Arthritis   . Hematuria   . Lower urinary tract symptoms (LUTS)   . Prostate cancer urology--  dr herrick/ dr Valere Dross    DX 2014 ---  PSA 15.2,  Gleason 6/7,  vol 28cc,  s/p radical prostatectomy and external beam radiation therapy--  recurrent , PSA 0.04 (12-28-2014)  . Nocturia   . Urinary frequency    Medications:  Scheduled:  . sodium chloride   Intravenous Once  . bariatric protein shake  2 oz Oral TID WC  . enoxaparin (LOVENOX) injection  40 mg Subcutaneous  Q24H  . insulin aspart  0-9 Units Subcutaneous 6 times per day  . pantoprazole (PROTONIX) IV  40 mg Intravenous Q12H  . sodium chloride  1,000 mL Intravenous Once  . sodium chloride  10-40 mL Intracatheter Q12H   Infusions:  . Marland KitchenTPN (CLINIMIX-E) Adult 60 mL/hr at 04/14/15 1723   And  . fat emulsion 240 mL (04/14/15 1723)  . sodium chloride 55 mL/hr at 04/14/15 1007   Insulin Requirements: 5 units/24 hr; sensitive Novolog scale q4hr  Current Nutrition: FL diet, Bariatric Protein shake ordered tid with meals - not tolerating protein shake - 0% meals charted  IVF: NS at 62ml/hr  Central access: PICC  TPN start date: 9/23  ASSESSMENT  Howard Valentine s/p cystectomy w/ ileal conduit on 04/01/15 for bladder cancer, admitted 04/10/15 w/ increasing nausea over the past 3 days, constipation for over a week, and cellulitis & drainage at his midline incision.  CT showed partial SBO at the site of the anastomosis. Pharmacy is consulted to begin parenteral nutrition.  Significant events:  9/23: Replete Magnesium 9/24: Repleting K & Phos, passing flatus, stool  9/26: Plan for OR today for wound exploration / closure due to concern for evisceration as cause for significant wound drainage preventing wound healing  Today:   Glucose at goal (<150)    Electrolytes -K and Na low, Mg, Phos, Corr Ca WNL  Renal - SCr wnl, Good UOP, ~ 1.4 ml/kg/hr  LFTs - WNL  TGs - 137 (9/24), 119 (9/26)  Prealbumin - 4.8 (9/24), 6.9 (9/26)  NUTRITIONAL GOALS                                                                                             RD recs: 70-80g/day protein, 1700-2000Kcal/day. >/= 1.7L fluid/day  Update 9/26 with new clinimix goal to better match RD recs Clinimix 5/20 at goal rate 65 ml/hr +20% fat emulsion at 50ml/hr to provide:  78g/day protein, 1853 kcal/day, 1.56 L fluid/day  PLAN                                                                                                                          At 1800 today:  Change to Clinimix E 5/20 at 5ml/hr (goal rate)  20% fat emulsion at 72ml/hr.  TPN to contain standard multivitamins and trace elements.  Unable to adjust Na and Cl in TPN due to use of pre-mix formulation. Clinimix E formulas contain 35 mEq/L of Na+.    Replace potassium with KCl 34mEq in 45mL q1h x 5  Replace Magnesium 2g IV x 1  Reduce IVF of NS to 55ml/hr.  Sensitive Novolog SSI q4h.   TPN lab panels on Mondays & Thursdays.  CMET   F/u daily  Ralene Bathe, PharmD, BCPS 04/15/2015, 11:00 AM  Pager: [phone]

## 2015-04-15 NOTE — Op Note (Signed)
Preoperative diagnosis:  1. Midline wound evisceration   Postoperative diagnosis:  1. Same   Procedure: 1. Exploratory laparotomy 2. Closure of fascial dehiscence 3. Urostomy revision  Surgeon: [name], MD  Anesthesia: General  Complications: None  Intraoperative findings: The patient had a knuckle of bowel that had eviscerated through a 4 cm fascial dehiscence just below the patient's umbilicus.  In addition, the superior aspect of the ostomy had retracted into the skin numerous Vicryl stitches had been torn.    EBL: Minimal  Specimens: None  Indication: Howard Valentine is a 59 y.o. patient  Who underwent cystectomy 13 days prior.  He presented to the emergency department 5 days ago with dehydration and uncontrolled hiccups.  In the ER, he had a coughing spell that led to fascial dehiscence.  He has had severe drainage from his midline wound over the last several days which has progressively opened because of his poor nutritional status.  This morning I noted bowel poking through the fascia in an area where the wound had opened.  Given that he had eviscerated bowel, we discussed urgent return to the operating room for repair.  After reviewing the management options for treatment, he elected to proceed with the above surgical procedure(s). We have discussed the potential benefits and risks of the procedure, side effects of the proposed treatment, the likelihood of the patient achieving the goals of the procedure, and any potential problems that might occur during the procedure or recuperation. Informed consent has been obtained.  Description of procedure:  The patient was taken to the operating room and general anesthesia was induced.  Skin staples were removed in the central portion of the incision and the ostomy appliance was taken off.  The wound was then prepped and draped in the routine sterile fashion.  A timeout was then held.  I began by opening the incision more  superior around the umbilicus and inferior towards the pubic bone.  I then dissected the fascial layer off of the underlying bowel.  There was one knuckle of bowel that was adherent to the fascia just adjacent to the stoma on the right.  I was able to separate this out relatively atraumatically.  There was a small serosal injury which I embrocated with a 3-0 Vicryl.  Once the fascia was separated from the underlying bowel to a reasonable distance I turned my attention to the stoma.  Several of the sutures and pulled through on the superior aspect.  As such, I removed the sutures and then dissected out the stoma down to the fascia.  I then reapproximated the skin to the base of the stoma using 3-0 Vicryl in interrupted fashion.  At this point I used 1-0 Prolenes 4 and went through both skin and fascia in the open part of the incision separating these by approximately 1 inch.  I place a small section approximately 2 inches long of 14 French red rubber catheter through the suture and snapped both ends of the suture.  These were used as the retention sutures which we tied once the skin was completely closed.  I then used a 1-0 PDS stitch and ran along the open fascial area closing this quite easily without strangulating or causing any untoward injury to the underlying bowel.  I then irrigated the wound numerous times and closed the skin with stapler.  Once the skin was closed I tied the  Retention sutures.  I then placed an ostomy appliance over the urostomy and 4  x 4 and tape over the midline incision.  The patient was subsequently awoken and returned to the PACU in stable condition.  At the end of the case all laps and needles and instruments had been accounted for.   [name], M.D.

## 2015-04-15 NOTE — Anesthesia Preprocedure Evaluation (Addendum)
Anesthesia Evaluation  Patient identified by MRN, date of birth, ID band Patient awake    Reviewed: Allergy & Precautions, NPO status , Patient's Chart, lab work & pertinent test results  Airway Mallampati: I  TM Distance: >3 FB Neck ROM: Full    Dental  (+) Teeth Intact, Poor Dentition, Loose, Dental Advisory Given   Pulmonary former smoker,    breath sounds clear to auscultation       Cardiovascular negative cardio ROS   Rhythm:Regular Rate:Normal     Neuro/Psych negative neurological ROS  negative psych ROS   GI/Hepatic negative GI ROS, Neg liver ROS,   Endo/Other  negative endocrine ROS  Renal/GU negative Renal ROS  negative genitourinary   Musculoskeletal  (+) Arthritis ,   Abdominal   Peds negative pediatric ROS (+)  Hematology negative hematology ROS (+)   Anesthesia Other Findings   Reproductive/Obstetrics negative OB ROS                             Lab Results  Component Value Date   WBC 15.0* 04/15/2015   HGB 9.5* 04/15/2015   HCT 28.2* 04/15/2015   MCV 91.9 04/15/2015   PLT 343 04/15/2015   Lab Results  Component Value Date   CREATININE 0.64 04/15/2015   BUN 7 04/15/2015   NA 128* 04/15/2015   K 3.3* 04/15/2015   CL 98* 04/15/2015   CO2 24 04/15/2015    Anesthesia Physical  Anesthesia Plan  ASA: III  Anesthesia Plan: General   Post-op Pain Management:    Induction: Intravenous and Rapid sequence  Airway Management Planned: Oral ETT  Additional Equipment:   Intra-op Plan:   Post-operative Plan: Extubation in OR  Informed Consent: I have reviewed the patients History and Physical, chart, labs and discussed the procedure including the risks, benefits and alternatives for the proposed anesthesia with the patient or authorized representative who has indicated his/her understanding and acceptance.   Dental advisory given  Plan Discussed with: CRNA,  Anesthesiologist and Surgeon  Anesthesia Plan Comments: (Patient with hx of all day cystectomy with ileal conduit 2 weeks prior here for I and D of abdominal midline Patient with multiple laboratory abnormalities including hyponatremia, severe hypoalbuminemia, hypocalcemia likely due to malnutrition secondary to inadequate PO intake here for urgent wash out Will start TPN tonight given malnutrition Previous grade 1 view with DL)       Anesthesia Quick Evaluation

## 2015-04-15 NOTE — Anesthesia Postprocedure Evaluation (Signed)
  Anesthesia Post-op Note  Patient: Howard Valentine  Procedure(s) Performed: Procedure(s) (LRB): CLOSURE OF WOUND DEHISCENCE (N/A)  Patient Location: PACU  Anesthesia Type: General  Level of Consciousness: awake and alert   Airway and Oxygen Therapy: Patient Spontanous Breathing  Post-op Pain: mild  Post-op Assessment: Post-op Vital signs reviewed, Patient's Cardiovascular Status Stable, Respiratory Function Stable, Patent Airway and No signs of Nausea or vomiting  Last Vitals:  Filed Vitals:   04/15/15 1930  BP: 134/68  Pulse: 111  Temp:   Resp: 14    Post-op Vital Signs: stable   Complications: No apparent anesthesia complications

## 2015-04-15 NOTE — Progress Notes (Signed)
  Subjective: Patient has no appetite.  His energy level has really declined.  He denies any pain.  Continues to have significant wound drainage. Only has nausea when he has coughing fits.  Continues to have hiccups.  Objective: Vital signs in last 24 hours: Temp:  [97.7 F (36.5 C)-98.9 F (37.2 C)] 97.7 F (36.5 C) (09/26 0448) Pulse Rate:  [95-117] 117 (09/26 0448) Resp:  [16-20] 16 (09/26 0448) BP: (125-140)/(61-73) 125/61 mmHg (09/26 0448) SpO2:  [97 %-99 %] 98 % (09/26 0448)  Intake/Output from previous day: 09/25 0701 - 09/26 0700 In: 0  Out: 2225 [Urine:2225] Intake/Output this shift:    Physical Exam:  Ostomy is pink and viable with stents in place and the urine in his bag is clear. His abdomen is nondistended. The incision has staples that are intact and is draining clear fluid from the inferior aspect.the wound appears to have opened more.there appears to be knuckles of bowel underneath and my concern is for evisceration.   Lab Results:  Recent Labs  04/13/15 0507 04/14/15 0515 04/15/15 0455  HGB 9.9* 10.8* 9.5*  HCT 29.3* 31.6* 28.2*   BMET  Recent Labs  04/14/15 0515 04/15/15 0455  NA 126* 128*  K 3.5 3.3*  CL 96* 98*  CO2 22 24  GLUCOSE 127* 152*  BUN 6 7  CREATININE 0.54* 0.64  CALCIUM 7.3* 7.3*   No results for input(s): LABPT, INR in the last 72 hours. No results for input(s): LABURIN in the last 72 hours. Results for orders placed or performed during the hospital encounter of 04/10/15  Culture, Urine     Status: None   Collection Time: 04/10/15  3:32 PM  Result Value Ref Range Status   Specimen Description URINE, CLEAN CATCH  Final   Special Requests NONE  Final   Culture   Final    2,000 COLONIES/mL INSIGNIFICANT GROWTH Performed at [facility]    Report Status 04/11/2015 FINAL  Final    Studies/Results: Dg Abd 1 View  04/15/2015   CLINICAL DATA:  Lower abdominal pain.  Recent cystectomy.  EXAM: ABDOMEN - 1 VIEW   COMPARISON:  CT 04/11/2015  FINDINGS: Bilateral ureteral stents are noted which extend through a urostomy. Midline skin staples noted in the pelvis and lower abdomen. No dilated loops of large or small bowel. No organomegaly or mass lesion.  IMPRESSION: No radiographic evidence of complication following cystectomy.   Electronically Signed   By: Suzy Bouchard M.D.   On: 04/15/2015 09:13    Assessment/Plan:  The patient is making minimal progress.  He continues on TPN which is desperately needed.  However, my concern is that he has an evisceration.  This is cause significant wound drainage which is preventing the wound from healing and causing it to open up more.  As such, we've opted to proceed to the operating room this afternoon for wound exploration and attempt at closure.   LOS: 5 days   [name] 04/15/2015, 10:55 AM

## 2015-04-16 ENCOUNTER — Encounter (HOSPITAL_COMMUNITY): Payer: Self-pay | Admitting: Urology

## 2015-04-16 ENCOUNTER — Inpatient Hospital Stay (HOSPITAL_BASED_OUTPATIENT_CLINIC_OR_DEPARTMENT_OTHER): Payer: BLUE CROSS/BLUE SHIELD

## 2015-04-16 DIAGNOSIS — M7989 Other specified soft tissue disorders: Secondary | ICD-10-CM | POA: Diagnosis not present

## 2015-04-16 LAB — GLUCOSE, CAPILLARY
GLUCOSE-CAPILLARY: 122 mg/dL — AB (ref 65–99)
GLUCOSE-CAPILLARY: 122 mg/dL — AB (ref 65–99)
GLUCOSE-CAPILLARY: 151 mg/dL — AB (ref 65–99)
GLUCOSE-CAPILLARY: 151 mg/dL — AB (ref 65–99)
GLUCOSE-CAPILLARY: 176 mg/dL — AB (ref 65–99)
Glucose-Capillary: 146 mg/dL — ABNORMAL HIGH (ref 65–99)
Glucose-Capillary: 148 mg/dL — ABNORMAL HIGH (ref 65–99)

## 2015-04-16 LAB — COMPREHENSIVE METABOLIC PANEL
ALT: 12 U/L — AB (ref 17–63)
ANION GAP: 5 (ref 5–15)
AST: 19 U/L (ref 15–41)
Albumin: 1.8 g/dL — ABNORMAL LOW (ref 3.5–5.0)
Alkaline Phosphatase: 67 U/L (ref 38–126)
BUN: 8 mg/dL (ref 6–20)
CALCIUM: 7.5 mg/dL — AB (ref 8.9–10.3)
CHLORIDE: 101 mmol/L (ref 101–111)
CO2: 24 mmol/L (ref 22–32)
CREATININE: 0.59 mg/dL — AB (ref 0.61–1.24)
Glucose, Bld: 132 mg/dL — ABNORMAL HIGH (ref 65–99)
Potassium: 3.9 mmol/L (ref 3.5–5.1)
SODIUM: 130 mmol/L — AB (ref 135–145)
Total Bilirubin: 0.1 mg/dL — ABNORMAL LOW (ref 0.3–1.2)
Total Protein: 4.9 g/dL — ABNORMAL LOW (ref 6.5–8.1)

## 2015-04-16 LAB — CBC
HCT: 27.2 % — ABNORMAL LOW (ref 39.0–52.0)
Hemoglobin: 9.1 g/dL — ABNORMAL LOW (ref 13.0–17.0)
MCH: 31.1 pg (ref 26.0–34.0)
MCHC: 33.5 g/dL (ref 30.0–36.0)
MCV: 92.8 fL (ref 78.0–100.0)
PLATELETS: 318 10*3/uL (ref 150–400)
RBC: 2.93 MIL/uL — ABNORMAL LOW (ref 4.22–5.81)
RDW: 14.7 % (ref 11.5–15.5)
WBC: 13.8 10*3/uL — ABNORMAL HIGH (ref 4.0–10.5)

## 2015-04-16 LAB — MAGNESIUM: MAGNESIUM: 1.8 mg/dL (ref 1.7–2.4)

## 2015-04-16 MED ORDER — TRACE MINERALS CR-CU-MN-SE-ZN 10-1000-500-60 MCG/ML IV SOLN
INTRAVENOUS | Status: AC
  Administered 2015-04-16: 17:00:00 via INTRAVENOUS
  Filled 2015-04-16: qty 1560

## 2015-04-16 MED ORDER — FAT EMULSION 20 % IV EMUL
240.0000 mL | INTRAVENOUS | Status: AC
  Administered 2015-04-16: 240 mL via INTRAVENOUS
  Filled 2015-04-16: qty 250

## 2015-04-16 MED ORDER — GLUCERNA SHAKE PO LIQD
237.0000 mL | Freq: Three times a day (TID) | ORAL | Status: DC
  Administered 2015-04-16 – 2015-04-20 (×4): 237 mL via ORAL
  Filled 2015-04-16 (×14): qty 237

## 2015-04-16 MED ORDER — INSULIN ASPART 100 UNIT/ML ~~LOC~~ SOLN
0.0000 [IU] | Freq: Four times a day (QID) | SUBCUTANEOUS | Status: DC
  Administered 2015-04-16 – 2015-04-17 (×5): 1 [IU] via SUBCUTANEOUS
  Administered 2015-04-17: 2 [IU] via SUBCUTANEOUS
  Administered 2015-04-18: 1 [IU] via SUBCUTANEOUS

## 2015-04-16 MED ORDER — MAGNESIUM SULFATE 2 GM/50ML IV SOLN
2.0000 g | Freq: Once | INTRAVENOUS | Status: AC
  Administered 2015-04-16: 2 g via INTRAVENOUS
  Filled 2015-04-16 (×2): qty 50

## 2015-04-16 MED ORDER — MEGESTROL ACETATE 400 MG/10ML PO SUSP
200.0000 mg | Freq: Every day | ORAL | Status: DC
  Administered 2015-04-16 – 2015-04-17 (×2): 200 mg via ORAL
  Filled 2015-04-16 (×2): qty 10

## 2015-04-16 MED ORDER — GABAPENTIN 300 MG PO CAPS
300.0000 mg | ORAL_CAPSULE | Freq: Every day | ORAL | Status: DC
  Administered 2015-04-16 – 2015-04-17 (×2): 300 mg via ORAL
  Filled 2015-04-16 (×2): qty 1

## 2015-04-16 NOTE — Care Management Note (Addendum)
Case Management Note  Patient Details  Name: Howard Valentine MRN: [id number] Date of Birth: 1955/11/19  Subjective/Objective:                 59 yo admitted with admitted with elevated WBC   Action/Plan: From home with wife.  Expected Discharge Date:   (unknown)               Expected Discharge Plan:     In-House Referral:     Discharge planning Services  CM Consult  Post Acute Care Choice:    Choice offered to:     DME Arranged:    DME Agency:     HH Arranged:  RN [place] Agency:  [place]  Status of Service:  In process, will continue to follow  Medicare Important Message Given:    Date Medicare IM Given:    Medicare IM give by:    Date Additional Medicare IM Given:    Additional Medicare Important Message give by:     If discussed at [place] of Stay Meetings, dates discussed:    Additional Comments: Pt was active with [facility] prior to admission for RN assistance for new ostomy. Will need MD order for resumption of services at discharge. PT recommending no PT follow up at discharge. Pt currently on TPN in the hospital and will need orders if pt is to discharge home on TPN. CM will continue to follow. Lynnell Catalan, RN 04/16/2015, 2:13 PM

## 2015-04-16 NOTE — Progress Notes (Addendum)
*  Preliminary Results* Right upper extremity venous duplex completed. Right upper extremity is positive for deep and superficial vein thrombosis involving the right subclavian, axillary, brachial, and basilic veins. Thrombus appears to be surrounding the PICC line.  Preliminary results discussed with Chrys Racer, RN and Dr. Louis Meckel.  04/16/2015 11:13 AM  Maudry Mayhew, RVT, RDCS, RDMS

## 2015-04-16 NOTE — Progress Notes (Addendum)
PARENTERAL NUTRITION CONSULT NOTE  Pharmacy Consult for TPN Indication: partial SBO, Cystectomy with Ileal conduit 9/12  No Known Allergies  Patient Measurements: Height: 5\' 6"  (167.6 cm) Weight: 148 lb (67.132 kg) IBW/kg (Calculated) : 63.8  Vital Signs: Temp: 98 F (36.7 C) (09/27 0441) Temp Source: Oral (09/27 0441) BP: 140/63 mmHg (09/27 0441) Pulse Rate: 87 (09/27 0441) Intake/Output from previous day: 09/26 0701 - 09/27 0700 In: 2000 [I.V.:1750; IV Piggyback:250] Out: 2100 [Urine:1400; Blood:50] Intake/Output from this shift:    Labs:  Recent Labs  04/14/15 0515 04/15/15 0455 04/16/15 0645  WBC 20.4* 15.0* 13.8*  HGB 10.8* 9.5* 9.1*  HCT 31.6* 28.2* 27.2*  PLT 423* 343 318     Recent Labs  04/13/15 0915 04/14/15 0515 04/15/15 0455 04/16/15 0530  NA  --  126* 128* 130*  K  --  3.5 3.3* 3.9  CL  --  96* 98* 101  CO2  --  22 24 24   GLUCOSE  --  127* 152* 132*  BUN  --  6 7 8   CREATININE  --  0.54* 0.64 0.59*  CALCIUM  --  7.3* 7.3* 7.5*  MG  --  1.8 1.7  --   PHOS  --  2.7 3.0  --   PROT  --   --  4.8* 4.9*  ALBUMIN  --   --  1.8* 1.8*  AST  --   --  22 19  ALT  --   --  15* 12*  ALKPHOS  --   --  54 67  BILITOT  --   --  0.5 0.1*  PREALBUMIN 4.8*  --  6.9*  --   TRIG  --   --  119  --    Estimated Creatinine Clearance: 88.6 mL/min (by C-G formula based on Cr of 0.59).    Recent Labs  04/16/15 0012 04/16/15 0438 04/16/15 0739  GLUCAP 151* 122* 122*   Medical History: Past Medical History  Diagnosis Date  . Arthritis   . Hematuria   . Lower urinary tract symptoms (LUTS)   . Prostate cancer urology--  dr herrick/ dr Valere Dross    DX 2014 ---  PSA 15.2,  Gleason 6/7,  vol 28cc,  s/p radical prostatectomy and external beam radiation therapy--  recurrent , PSA 0.04 (12-28-2014)  . Nocturia   . Urinary frequency    Medications:  Scheduled:  . sodium chloride   Intravenous Once  . acetaminophen  1,000 mg Intravenous 4 times per day  .  bariatric protein shake  2 oz Oral TID WC  . calcium carbonate  2 tablet Oral Daily  . enoxaparin (LOVENOX) injection  40 mg Subcutaneous Q24H  . feeding supplement (GLUCERNA SHAKE)  237 mL Oral TID BM  . insulin aspart  0-9 Units Subcutaneous 6 times per day  . magnesium sulfate 1 - 4 g bolus IVPB  2 g Intravenous Once  . megestrol  200 mg Oral Daily  . mupirocin ointment   Nasal BID  . ondansetron  4 mg Oral Once  . pantoprazole (PROTONIX) IV  40 mg Intravenous Q12H   Infusions:  . Marland KitchenTPN (CLINIMIX-E) Adult 65 mL/hr at 04/15/15 2032   And  . fat emulsion 240 mL (04/15/15 2032)  . sodium chloride 15 mL/hr at 04/15/15 1910   Insulin Requirements: 4 units/24 hr; sensitive Novolog scale q4hr  Current Nutrition: FL diet, Bariatric Protein shake ordered tid with meals - not tolerating protein shake - 0% meals charted  IVF: NS at 77ml/hr  Central access: PICC  TPN start date: 9/23  ASSESSMENT                                                                                                          59yoM s/p cystectomy w/ ileal conduit on 04/01/15 for bladder cancer, admitted 04/10/15 w/ increasing nausea over the past 3 days, constipation for over a week, and cellulitis & drainage at his midline incision.  CT showed partial SBO at the site of the anastomosis. Pharmacy is consulted to begin parenteral nutrition.  Significant events:  9/23: Replete Magnesium 9/24: Repleting K & Phos, passing flatus, stool  9/26: S/p ex-lap for wound closure and ostomy revision 2/2 fascial dehiscence and evisceration  Today:   Glucose at goal (<150)    Electrolytes: Na low but improving, Mg, Phos, Corr Ca WNL  Renal - SCr wnl, Good UOP, 0.9 ml/kg/hr  LFTs - WNL  TGs - 137 (9/24), 119 (9/26)  Prealbumin - 4.8 (9/24), 6.9 (9/26)  NUTRITIONAL GOALS                                                                                             RD recs: 70-80g/day protein, 1700-2000Kcal/day. >/= 1.7L  fluid/day  Update 9/26 with new clinimix goal to better match RD recs Clinimix 5/20 at goal rate 65 ml/hr +20% fat emulsion at 23ml/hr to provide: 78g/day protein, 1853 kcal/day, 1.56 L fluid/day  PLAN                                                                                                                         At 1800 today:  Continue Clinimix E 5/20 at 77ml/hr (goal rate)  20% fat emulsion at 87ml/hr.  TPN to contain standard multivitamins and trace elements.  Unable to adjust Na and Cl in TPN due to use of pre-mix formulation. Clinimix E formulas contain 35 mEq/L of Na+.    Continue IVF of NS to 57ml/hr.  Change to sensitive Novolog SSI q6h.   TPN lab panels on Mondays & Thursdays.  BMET in AM  F/u daily **Note magnesium 2g never given 9/26, will re-order for  today  Ralene Bathe, PharmD, BCPS 04/16/2015, 8:01 AM  Pager: [phone]

## 2015-04-16 NOTE — Progress Notes (Signed)
Nutrition Follow-up  INTERVENTION:   TPN per Pharmacy D/C Premier Protein Continue Glucerna Shake po TID, each supplement provides 220 kcal and 10 grams of protein RD to continue to monitor  NUTRITION DIAGNOSIS:   Inadequate oral intake related to inability to eat, nausea, vomiting, poor appetite as evidenced by per patient/family report, meal completion < 50%.  Ongoing.  GOAL:   Patient will meet greater than or equal to 90% of their needs  Meeting with TPN.  MONITOR:   PO intake, Supplement acceptance, Labs, Weight trends, Skin, I & O's, Other (Comment) (TPN)  ASSESSMENT:   59 yo male presents for post op dehydration and poor po intake. s/p cystectomy and IC 9/12. Pt was having trouble with po intake and stop having flatus and BMs on 9/20. Pt was exhibting  nausea, vomitting, and a quick sense of fullness with meals. Pt does nto exhibit signs or symptoms of malnutrition. Pt did say ensure fills him up quickly. Pt did not provide a reason for poor appetite now that his nausea and vomitting have cleared up, outside of sense of fullness. MD wants to begin TPN tonight. S/p 9/26 Procedure: 1. Exploratory laparotomy 2. Closure of fascial dehiscence  3.Urostomy revision  Pt reports poor appetite since TPN at goal rate. Pt states that he tries to drink his supplements but he doesn't want to push himself to drink too much and end up vomiting. Encouraged pt to continue slow frequent sips of supplements. Pt states he doesn't like the Premier Protein shakes. Would like to try the Glucerna shakes that have been ordered. Pt ordered lunch of english muffin and fruit cup. Will monitor tolerance of meals and supplements. If able to consume most of his meals, may need to decrease TPN to enhance appetite.  Labs reviewed: CBGs: 122-151 Low Na, Creatinine Mg/Phos WNL  Diet Order:  .TPN (CLINIMIX-E) Adult Diet regular Room service appropriate?: Yes; Fluid consistency:: Thin .TPN (CLINIMIX-E)  Adult  Skin:  Wound (see comment) (Stg II Pressure Ulcer to buttocks, surgical incision on abdomen,)  Last BM:  9/26  Height:   Ht Readings from Last 1 Encounters:  04/11/15 5\' 6"  (1.676 m)    Weight:   Wt Readings from Last 1 Encounters:  04/11/15 148 lb (67.132 kg)    Ideal Body Weight:  64.54 kg  BMI:  Body mass index is 23.9 kg/(m^2).  Estimated Nutritional Needs:   Kcal:  1700-2000  Protein:  70-80 grams  Fluid:  >=/ 1.7L  EDUCATION NEEDS:   No education needs identified at this time  Clayton Bibles, MS, RD, LDN Pager: [phone] After Hours Pager: [phone]

## 2015-04-16 NOTE — Progress Notes (Addendum)
1 Day Post-Op Subjective: S/p ex-lap for dehiscence, with ostomy revision on 04/15/15. The patient was passing large volume flatus prior to surgery with formed bowel movements. Has done well since surgery.   Objective: Vital signs in last 24 hours: Temp:  [97.8 F (36.6 C)-98.2 F (36.8 C)] 98 F (36.7 C) (09/27 0441) Pulse Rate:  [87-111] 87 (09/27 0441) Resp:  [6-16] 16 (09/27 0441) BP: (119-140)/(60-74) 140/63 mmHg (09/27 0441) SpO2:  [98 %-100 %] 100 % (09/27 0441)  Intake/Output from previous day: 09/26 0701 - 09/27 0700 In: 2000 [I.V.:1750; IV Piggyback:250] Out: 2100 [Urine:1400; Blood:50] Intake/Output this shift:    Physical Exam:  Ostomy is pink and viable with stents in place and the urine in his bag is clear. His abdomen is nondistended. Dressing is intact.    Lab Results:  Recent Labs  04/14/15 0515 04/15/15 0455 04/16/15 0645  HGB 10.8* 9.5* 9.1*  HCT 31.6* 28.2* 27.2*   BMET  Recent Labs  04/15/15 0455 04/16/15 0530  NA 128* 130*  K 3.3* 3.9  CL 98* 101  CO2 24 24  GLUCOSE 152* 132*  BUN 7 8  CREATININE 0.64 0.59*  CALCIUM 7.3* 7.5*   No results for input(s): LABPT, INR in the last 72 hours. No results for input(s): LABURIN in the last 72 hours. Results for orders placed or performed during the hospital encounter of 04/10/15  Culture, Urine     Status: None   Collection Time: 04/10/15  3:32 PM  Result Value Ref Range Status   Specimen Description URINE, CLEAN CATCH  Final   Special Requests NONE  Final   Culture   Final    2,000 COLONIES/mL INSIGNIFICANT GROWTH Performed at [facility]    Report Status 04/11/2015 FINAL  Final  MRSA PCR Screening     Status: None   Collection Time: 04/15/15  3:39 PM  Result Value Ref Range Status   MRSA by PCR NEGATIVE NEGATIVE Final    Comment:        The GeneXpert MRSA Assay (FDA approved for NASAL specimens only), is one component of a comprehensive MRSA colonization surveillance  program. It is not intended to diagnose MRSA infection nor to guide or monitor treatment for MRSA infections.     Studies/Results: Dg Abd 1 View  04/15/2015   CLINICAL DATA:  Lower abdominal pain.  Recent cystectomy.  EXAM: ABDOMEN - 1 VIEW  COMPARISON:  CT 04/11/2015  FINDINGS: Bilateral ureteral stents are noted which extend through a urostomy. Midline skin staples noted in the pelvis and lower abdomen. No dilated loops of large or small bowel. No organomegaly or mass lesion.  IMPRESSION: No radiographic evidence of complication following cystectomy.   Electronically Signed   By: Suzy Bouchard M.D.   On: 04/15/2015 09:13    Assessment/Plan:  The patient's bowel function appears to be improving although his appetite has not improved. S/p ex-lap for dehiscence with ostomy revision.   Continue TPN  Regular diet, supplements, megace  Upper extremity doppler  Continue lovenox   LOS: 6 days   Christell Faith 04/16/2015, 7:31 AM  I performed a history and physical examination of the patient and discussed his management with the resident.  I reviewed the resident's note and agree with the documented findings and plan of care. Will also try adding gabapentin to his regimen to help control his hiccups. Needs PT

## 2015-04-16 NOTE — Progress Notes (Signed)
Peripherally Inserted Central Catheter/Midline Placement  The IV Nurse has discussed with the patient and/or persons authorized to consent for the patient, the purpose of this procedure and the potential benefits and risks involved with this procedure.  The benefits include less needle sticks, lab draws from the catheter and patient may be discharged home with the catheter.  Risks include, but not limited to, infection, bleeding, blood clot (thrombus formation), and puncture of an artery; nerve damage and irregular heat beat.  Alternatives to this procedure were also discussed.  Consent originally signed on 04-12-2015, reviewed risks and benefits and procedure with patient.        Cothren, Nicolette Bang 04/16/2015, 4:12 PM

## 2015-04-17 LAB — CBC
HCT: 26.4 % — ABNORMAL LOW (ref 39.0–52.0)
Hemoglobin: 8.9 g/dL — ABNORMAL LOW (ref 13.0–17.0)
MCH: 31.1 pg (ref 26.0–34.0)
MCHC: 33.7 g/dL (ref 30.0–36.0)
MCV: 92.3 fL (ref 78.0–100.0)
PLATELETS: 362 10*3/uL (ref 150–400)
RBC: 2.86 MIL/uL — ABNORMAL LOW (ref 4.22–5.81)
RDW: 14.6 % (ref 11.5–15.5)
WBC: 12.8 10*3/uL — ABNORMAL HIGH (ref 4.0–10.5)

## 2015-04-17 LAB — BASIC METABOLIC PANEL
Anion gap: 7 (ref 5–15)
BUN: 9 mg/dL (ref 6–20)
CO2: 24 mmol/L (ref 22–32)
CREATININE: 0.56 mg/dL — AB (ref 0.61–1.24)
Calcium: 7.7 mg/dL — ABNORMAL LOW (ref 8.9–10.3)
Chloride: 97 mmol/L — ABNORMAL LOW (ref 101–111)
GFR calc Af Amer: >60 mL/min (ref >60)
GLUCOSE: 140 mg/dL — AB (ref 65–99)
Potassium: 3.7 mmol/L (ref 3.5–5.1)
SODIUM: 128 mmol/L — AB (ref 135–145)

## 2015-04-17 LAB — GLUCOSE, CAPILLARY
Glucose-Capillary: 134 mg/dL — ABNORMAL HIGH (ref 65–99)
Glucose-Capillary: 143 mg/dL — ABNORMAL HIGH (ref 65–99)
Glucose-Capillary: 122 mg/dL — ABNORMAL HIGH (ref 65–99)

## 2015-04-17 MED ORDER — POTASSIUM CHLORIDE 10 MEQ/50ML IV SOLN
10.0000 meq | INTRAVENOUS | Status: AC
  Administered 2015-04-17 (×4): 10 meq via INTRAVENOUS
  Filled 2015-04-17 (×2): qty 50

## 2015-04-17 MED ORDER — RIVAROXABAN 15 MG PO TABS
15.0000 mg | ORAL_TABLET | Freq: Two times a day (BID) | ORAL | Status: DC
  Administered 2015-04-17 – 2015-04-20 (×6): 15 mg via ORAL
  Filled 2015-04-17 (×10): qty 1

## 2015-04-17 MED ORDER — ACETAMINOPHEN 10 MG/ML IV SOLN
1000.0000 mg | Freq: Four times a day (QID) | INTRAVENOUS | Status: AC
  Administered 2015-04-17 – 2015-04-18 (×4): 1000 mg via INTRAVENOUS
  Filled 2015-04-17 (×5): qty 100

## 2015-04-17 MED ORDER — ENOXAPARIN SODIUM 80 MG/0.8ML ~~LOC~~ SOLN: 1.0000 mg/kg | Freq: Two times a day (BID) | SUBCUTANEOUS | Status: DC

## 2015-04-17 MED ORDER — ENOXAPARIN SODIUM 40 MG/0.4ML ~~LOC~~ SOLN
40.0000 mg | SUBCUTANEOUS | Status: AC
  Administered 2015-04-17: 40 mg via SUBCUTANEOUS
  Filled 2015-04-17: qty 0.4

## 2015-04-17 NOTE — Progress Notes (Signed)
[place] NOTE  Pharmacy Consult for TPN Indication: Partial SBO, Cystectomy with Ileal conduit 9/12  No Known Allergies  Patient Measurements: Height: 5\' 6"  (167.6 cm) Weight: 148 lb (67.132 kg) IBW/kg (Calculated) : 63.8  Vital Signs: Temp: 99.1 F (37.3 C) (09/28 0554) Temp Source: Oral (09/28 0554) BP: 176/74 mmHg (09/28 0554) Pulse Rate: 106 (09/28 0554) Intake/Output from previous day: 09/27 0701 - 09/28 0700 In: 1821.7 [P.O.:240; I.V.:581.7; IV Piggyback:100; TPN:900] Out: 1850 [Urine:1850] Intake/Output from this shift:    Labs:  Recent Labs  04/15/15 0455 04/16/15 0645 04/17/15 0525  WBC 15.0* 13.8* 12.8*  HGB 9.5* 9.1* 8.9*  HCT 28.2* 27.2* 26.4*  PLT 343 318 362     Recent Labs  04/15/15 0455 04/16/15 0530 04/17/15 0525  NA 128* 130* 128*  K 3.3* 3.9 3.7  CL 98* 101 97*  CO2 24 24 24   GLUCOSE 152* 132* 140*  BUN 7 8 9   CREATININE 0.64 0.59* 0.56*  CALCIUM 7.3* 7.5* 7.7*  MG 1.7 1.8  --   PHOS 3.0  --   --   PROT 4.8* 4.9*  --   ALBUMIN 1.8* 1.8*  --   AST 22 19  --   ALT 15* 12*  --   ALKPHOS 54 67  --   BILITOT 0.5 0.1*  --   PREALBUMIN 6.9*  --   --   TRIG 119  --   --    Estimated Creatinine Clearance: 88.6 mL/min (by C-G formula based on Cr of 0.56).    Recent Labs  04/16/15 2142 04/16/15 2351 04/17/15 0552  GLUCAP 176* 151* 134*   Medical History: Past Medical History  Diagnosis Date  . Arthritis   . Hematuria   . Lower urinary tract symptoms (LUTS)   . Prostate cancer urology--  dr herrick/ dr Valere Dross    DX 2014 ---  PSA 15.2,  Gleason 6/7,  vol 28cc,  s/p radical prostatectomy and external beam radiation therapy--  recurrent , PSA 0.04 (12-28-2014)  . Nocturia   . Urinary frequency    Medications:  Scheduled:  . sodium chloride   Intravenous Once  . acetaminophen  1,000 mg Intravenous 4 times per day  . calcium carbonate  2 tablet Oral Daily  . enoxaparin (LOVENOX) injection  40 mg  Subcutaneous Q24H  . feeding supplement (GLUCERNA SHAKE)  237 mL Oral TID BM  . gabapentin  300 mg Oral Daily  . insulin aspart  0-9 Units Subcutaneous Q6H  . megestrol  200 mg Oral Daily  . mupirocin ointment   Nasal BID  . ondansetron  4 mg Oral Once  . pantoprazole (PROTONIX) IV  40 mg Intravenous Q12H   Infusions:  . Marland KitchenTPN (CLINIMIX-E) Adult 65 mL/hr at 04/16/15 1702   And  . fat emulsion 240 mL (04/16/15 1702)  . sodium chloride 50 mL/hr at 04/17/15 7673   Insulin Requirements: 5 units/24 hr; sensitive Novolog scale q4hr  Current Nutrition: FL diet, Bariatric Protein shake ordered tid with meals - not tolerating protein shake - 20% meal charted  IVF: NS at 28ml/hr  Central access: PICC  TPN start date: 9/23  ASSESSMENT  22yoM s/p cystectomy w/ ileal conduit on 04/01/15 for bladder cancer, admitted 04/10/15 w/ increasing nausea over the past 3 days, constipation for over a week, and cellulitis & drainage at his midline incision.  CT showed partial SBO at the site of the anastomosis. Pharmacy is consulted to begin parenteral nutrition.  Significant events:  9/23: Replete Magnesium 9/24: Repleting K & Phos, passing flatus, stool  9/26: S/p ex-lap for wound closure and ostomy revision 2/2 fascial dehiscence and evisceration 9/27: DVT noted in RUE surrounding PICC, PICC changed to LUE  Today:   Glucose at goal (<150)    Electrolytes: Na low, sl decrease overnight, K, Mg, Phos, Corr Ca WNL  Renal - SCr wnl, Good UOP, 1.1 ml/kg/hr  LFTs - WNL  TGs - 137 (9/24), 119 (9/26)  Prealbumin - 4.8 (9/24), 6.9 (9/26)  NUTRITIONAL GOALS                                                                                             RD recs: 70-80g/day protein, 1700-2000Kcal/day. >/= 1.7L fluid/day  Update 9/26 with new clinimix goal to better match RD recs Clinimix 5/20 at goal  rate 65 ml/hr +20% fat emulsion at 70ml/hr to provide: 78g/day protein, 1853 kcal/day, 1.56 L fluid/day  PLAN                                                                                                                         At 1800 today:  Continue Clinimix E 5/20 at 40ml/hr (goal rate)  20% fat emulsion at 29ml/hr.  TPN to contain standard multivitamins and trace elements.  Unable to adjust Na and Cl in TPN due to use of pre-mix formulation. Clinimix E formulas contain 35 mEq/L of Na+.    Continue IVF of NS to 40ml/hr.  Potassium replacement: KCl 43mEq in 86ml q1h x 4  Continue sensitive Novolog SSI q6h.   TPN lab panels on Mondays & Thursdays.  F/u daily  Ralene Bathe, PharmD, BCPS 04/17/2015, 7:45 AM  Pager: [phone]

## 2015-04-17 NOTE — Progress Notes (Addendum)
2 Days Post-Op Subjective: Diagnosed with thrombosis along PICC line which was changed to his other arm as a result and anticoagulation started. Minimal PO intake. Still passing flatus and having formed BMs.  Objective: Vital signs in last 24 hours: Temp:  [99 F (37.2 C)-99.3 F (37.4 C)] 99.1 F (37.3 C) (09/28 0554) Pulse Rate:  [103-109] 106 (09/28 0554) Resp:  [16] 16 (09/28 0554) BP: (141-176)/(64-74) 176/74 mmHg (09/28 0554) SpO2:  [97 %-100 %] 98 % (09/28 0554)  Intake/Output from previous day: 09/27 0701 - 09/28 0700 In: 1821.7 [P.O.:240; I.V.:581.7; IV Piggyback:100; TPN:900] Out: 1850 [Urine:1850] Intake/Output this shift:    Physical Exam:  Ostomy is pink and viable with stents in place and the urine in his bag is clear. His abdomen is nondistended. Wound intact with serous drainage on the dressing.   Lab Results:  Recent Labs  04/15/15 0455 04/16/15 0645 04/17/15 0525  HGB 9.5* 9.1* 8.9*  HCT 28.2* 27.2* 26.4*   BMET  Recent Labs  04/16/15 0530 04/17/15 0525  NA 130* 128*  K 3.9 3.7  CL 101 97*  CO2 24 24  GLUCOSE 132* 140*  BUN 8 9  CREATININE 0.59* 0.56*  CALCIUM 7.5* 7.7*   No results for input(s): LABPT, INR in the last 72 hours. No results for input(s): LABURIN in the last 72 hours. Results for orders placed or performed during the hospital encounter of 04/10/15  Culture, Urine     Status: None   Collection Time: 04/10/15  3:32 PM  Result Value Ref Range Status   Specimen Description URINE, CLEAN CATCH  Final   Special Requests NONE  Final   Culture   Final    2,000 COLONIES/mL INSIGNIFICANT GROWTH Performed at [facility]    Report Status 04/11/2015 FINAL  Final  MRSA PCR Screening     Status: None   Collection Time: 04/15/15  3:39 PM  Result Value Ref Range Status   MRSA by PCR NEGATIVE NEGATIVE Final    Comment:        The GeneXpert MRSA Assay (FDA approved for NASAL specimens only), is one component of  a comprehensive MRSA colonization surveillance program. It is not intended to diagnose MRSA infection nor to guide or monitor treatment for MRSA infections.     Studies/Results: Dg Abd 1 View  04/15/2015   CLINICAL DATA:  Lower abdominal pain.  Recent cystectomy.  EXAM: ABDOMEN - 1 VIEW  COMPARISON:  CT 04/11/2015  FINDINGS: Bilateral ureteral stents are noted which extend through a urostomy. Midline skin staples noted in the pelvis and lower abdomen. No dilated loops of large or small bowel. No organomegaly or mass lesion.  IMPRESSION: No radiographic evidence of complication following cystectomy.   Electronically Signed   By: Suzy Bouchard M.D.   On: 04/15/2015 09:13    Assessment/Plan:  The patient's bowel function appears to be improving although his appetite has not improved. S/p ex-lap for dehiscence with ostomy revision.   Continue TPN  Regular diet, supplements, megace, appreciate nutrition recs  Therapeutic lovenox, consult to pharmacy about Xarelto for 6 weeks (repeat PVR around 05/15/15 if no progression of clot then 6 weeks anticoagulation)   LOS: 7 days   Christell Faith 04/17/2015, 8:24 AM   I performed a history and physical examination of the patient and discussed his management with the resident.  I reviewed the resident's note and agree with the documented findings and plan of care.  Start xarelto for upper extremity  DVT.  Repeat duplex in 1 month.  If there is no propagation of his upper DVT then a total of 6 weeks of Xarelto is recommended. PT eval and treat - needs to continue to ambulate and regain strength so that he can be discharged to home. Continue TPN.   He will be discharged once he has proven that he can meet his daily caloric intake orally.  Ultimately, he will need chemotherapy for his aggressive variant of bladder cancer.

## 2015-04-17 NOTE — Progress Notes (Addendum)
ANTICOAGULATION CONSULT NOTE - Initial Consult  Pharmacy Consult for Xarelto Indication: DVT treatment  No Known Allergies  Patient Measurements: Height: 5\' 6"  (167.6 cm) Weight: 148 lb (67.132 kg) IBW/kg (Calculated) : 63.8 Heparin Dosing Weight:   Vital Signs: Temp: 99.1 F (37.3 C) (09/28 0554) Temp Source: Oral (09/28 0554) BP: 176/74 mmHg (09/28 0554) Pulse Rate: 106 (09/28 0554)  Labs:  Recent Labs  04/15/15 0455 04/16/15 0530 04/16/15 0645 04/17/15 0525  HGB 9.5*  --  9.1* 8.9*  HCT 28.2*  --  27.2* 26.4*  PLT 343  --  318 362  CREATININE 0.64 0.59*  --  0.56*    Estimated Creatinine Clearance: 88.6 mL/min (by C-G formula based on Cr of 0.56).   Medical History: Past Medical History  Diagnosis Date  . Arthritis   . Hematuria   . Lower urinary tract symptoms (LUTS)   . Prostate cancer urology--  dr herrick/ dr Valere Dross    DX 2014 ---  PSA 15.2,  Gleason 6/7,  vol 28cc,  s/p radical prostatectomy and external beam radiation therapy--  recurrent , PSA 0.04 (12-28-2014)  . Nocturia   . Urinary frequency     Assessment: 55 yoM known to pharmacy for TPN dosing now with new DVT in RUE.  Pharmacy consulted to start Xarelto.  POD2 ex-lap for wound closure and ostomy revision 2/2 fascial dehiscence and evisceration.  Hgb low but stable over the last few days.  Plts WNL.  Renal function > 30 ml/min.  Discussed with Dr. Louis Meckel and plan is to wait until ~48 hours after last OR procedure to start full dose anticoagulation with xarelto.   Goal of Therapy:  Monitor platelets by anticoagulation protocol: Yes   Plan: Lovenox 40mg  SQ x 1 this AM, then start Xarelto 15mg  BID x 21 days tonight. Transition to Xarelto 20mg  once daily thereafter.   Pharmacy will provide education.   Monitor renal function, CBC, and signs and symptoms of bleeding.    Ralene Bathe, PharmD, BCPS 04/17/2015, 8:42 AM  Pager: [phone]

## 2015-04-18 LAB — CBC
HEMATOCRIT: 25.9 % — AB (ref 39.0–52.0)
Hemoglobin: 8.7 g/dL — ABNORMAL LOW (ref 13.0–17.0)
MCH: 30.4 pg (ref 26.0–34.0)
MCHC: 33.6 g/dL (ref 30.0–36.0)
MCV: 90.6 fL (ref 78.0–100.0)
Platelets: 411 10*3/uL — ABNORMAL HIGH (ref 150–400)
RBC: 2.86 MIL/uL — ABNORMAL LOW (ref 4.22–5.81)
RDW: 14.5 % (ref 11.5–15.5)
WBC: 12.3 10*3/uL — ABNORMAL HIGH (ref 4.0–10.5)

## 2015-04-18 LAB — MAGNESIUM: Magnesium: 1.8 mg/dL (ref 1.7–2.4)

## 2015-04-18 LAB — COMPREHENSIVE METABOLIC PANEL
ALK PHOS: 71 U/L (ref 38–126)
ALT: 14 U/L — AB (ref 17–63)
AST: 24 U/L (ref 15–41)
Albumin: 1.7 g/dL — ABNORMAL LOW (ref 3.5–5.0)
Anion gap: 6 (ref 5–15)
BUN: 9 mg/dL (ref 6–20)
CALCIUM: 7.9 mg/dL — AB (ref 8.9–10.3)
CO2: 25 mmol/L (ref 22–32)
CREATININE: 0.54 mg/dL — AB (ref 0.61–1.24)
Chloride: 98 mmol/L — ABNORMAL LOW (ref 101–111)
Glucose, Bld: 98 mg/dL (ref 65–99)
Potassium: 4.4 mmol/L (ref 3.5–5.1)
SODIUM: 129 mmol/L — AB (ref 135–145)
Total Bilirubin: 0.3 mg/dL (ref 0.3–1.2)
Total Protein: 5.1 g/dL — ABNORMAL LOW (ref 6.5–8.1)

## 2015-04-18 LAB — GLUCOSE, CAPILLARY
Glucose-Capillary: 138 mg/dL — ABNORMAL HIGH (ref 65–99)
Glucose-Capillary: 96 mg/dL (ref 65–99)
Glucose-Capillary: 96 mg/dL (ref 65–99)

## 2015-04-18 LAB — PHOSPHORUS: PHOSPHORUS: 3.5 mg/dL (ref 2.5–4.6)

## 2015-04-18 MED ORDER — INSULIN ASPART 100 UNIT/ML ~~LOC~~ SOLN: 0.0000 [IU] | Freq: Three times a day (TID) | SUBCUTANEOUS | Status: DC

## 2015-04-18 MED ORDER — PANTOPRAZOLE SODIUM 40 MG IV SOLR
40.0000 mg | INTRAVENOUS | Status: DC
  Administered 2015-04-18 – 2015-04-19 (×2): 40 mg via INTRAVENOUS
  Filled 2015-04-18 (×2): qty 40

## 2015-04-18 MED ORDER — ACETAMINOPHEN 500 MG PO TABS: 1000.0000 mg | ORAL_TABLET | Freq: Four times a day (QID) | ORAL | Status: DC | PRN

## 2015-04-18 MED ORDER — GABAPENTIN 300 MG PO CAPS
300.0000 mg | ORAL_CAPSULE | Freq: Three times a day (TID) | ORAL | Status: DC
  Administered 2015-04-18 – 2015-04-20 (×7): 300 mg via ORAL
  Filled 2015-04-18 (×7): qty 1

## 2015-04-18 MED ORDER — CEPHALEXIN 250 MG/5ML PO SUSR
500.0000 mg | Freq: Four times a day (QID) | ORAL | Status: DC
  Administered 2015-04-18 – 2015-04-20 (×8): 500 mg via ORAL
  Filled 2015-04-18 (×14): qty 10

## 2015-04-18 NOTE — Progress Notes (Signed)
Physical Therapy Treatment Patient Details Name: Howard Valentine MRN: [id number] DOB: March 01, 1956 Today's Date: 04/18/2015    History of Present Illness 59 y.o. male with recent hx of open radical cystectomy and ileal conduit on 04/01/15 due to bladder cancer and readmitted for nausea and vomiting thought to be from partial SBO on 04/10/15.    PT Comments    Patient's gait is steadier with the 4 wheeled RW. Patient to consider  If  Needs one prior to DC.   Follow Up Recommendations  Home health PT;Supervision - Intermittent     Equipment Recommendations   (patient to consider 4 wheeled.)    Recommendations for Other Services       Precautions / Restrictions Precautions Precautions: Fall Precaution Comments: urostomy Restrictions Weight Bearing Restrictions: No    Mobility  Bed Mobility Overal bed mobility: Modified Independent             General bed mobility comments: used rail and HOB raised, reports he sleeps in a reclliner  Transfers Overall transfer level: Needs assistance Equipment used: None Transfers: Sit to/from Stand Sit to Stand: Supervision         General transfer comment: verbal cues for hand placement, extra time to rise  Ambulation/Gait Ambulation/Gait assistance: Supervision Ambulation Distance (Feet): 800 Feet Assistive device: 4-wheeled walker   Gait velocity: faster than without   General Gait Details: ambulated x 300' without  UE support, tended to start using the rail, ambulated x 100' with IV pole. then continued x 400' with RW. patient requesting 4 wheeled RW trial.   Stairs            Wheelchair Mobility    Modified Rankin (Stroke Patients Only)       Balance Overall balance assessment: Needs assistance         Standing balance support: During functional activity;No upper extremity supported   Standing balance comment:  one balance loss during a turn around when RW did not turn as fast.                    Cognition Arousal/Alertness: Awake/alert Behavior During Therapy: WFL for tasks assessed/performed Overall Cognitive Status: Within Functional Limits for tasks assessed                      Exercises      General Comments        Pertinent Vitals/Pain Pain Score: 4  Pain Location: hiccups Pain Descriptors / Indicators: Discomfort Pain Intervention(s): Monitored during session;Premedicated before session    Home Living Family/patient expects to be discharged to:: Private residence Living Arrangements: Spouse/significant other Available Help at Discharge: Family;Available 24 hours/day Type of Home: House     Home Layout: Able to live on main level with bedroom/bathroom Home Equipment: Kasandra Knudsen - single point      Prior Function            PT Goals (current goals can now be found in the care plan section) Acute Rehab PT Goals Patient Stated Goal: to go home PT Goal Formulation: With patient Time For Goal Achievement: 04/20/15 Potential to Achieve Goals: Good Progress towards PT goals: Progressing toward goals    Frequency  Min 3X/week    PT Plan Current plan remains appropriate    Co-evaluation             End of Session   Activity Tolerance: Patient tolerated treatment well Patient left: in bed;with call bell/phone within reach;with nursing/sitter  in room     Time: 5956-3875 PT Time Calculation (min) (ACUTE ONLY): 15 min  Charges:  $Gait Training: 8-22 mins                    G Codes:      Claretha Cooper 04/18/2015, 4:38 PM Tresa Endo PT [phone]

## 2015-04-18 NOTE — Progress Notes (Signed)
3 Days Post-Op Subjective: Started on Xarelto last night for left upper extremity DVT TPN stopped last night Patient eating and feeling better Some serosanginous oozing from incision this AM Less hiccups/nausea  Objective: Vital signs in last 24 hours: Temp:  [98.4 F (36.9 C)-98.6 F (37 C)] 98.6 F (37 C) (09/29 0604) Pulse Rate:  [101] 101 (09/29 0604) Resp:  [16] 16 (09/29 0604) BP: (125-149)/(57-64) 125/57 mmHg (09/29 0604) SpO2:  [100 %] 100 % (09/29 0604)  Intake/Output from previous day: 09/28 0701 - 09/29 0700 In: 860 [P.O.:860] Out: 4600 [Urine:4600] Intake/Output this shift:    Physical Exam:  Ostomy is pink and viable with stents in place and the urine in his bag is blood tinged. His abdomen is non-distended, there is edema around the incision with drained drainage around the retention sutures, there is some erythema on the left aspect between the incision and the stoma.   Lab Results:  Recent Labs  04/16/15 0645 04/17/15 0525 04/18/15 0550  HGB 9.1* 8.9* 8.7*  HCT 27.2* 26.4* 25.9*   BMET  Recent Labs  04/17/15 0525 04/18/15 0550  NA 128* 129*  K 3.7 4.4  CL 97* 98*  CO2 24 25  GLUCOSE 140* 98  BUN 9 9  CREATININE 0.56* 0.54*  CALCIUM 7.7* 7.9*   No results for input(s): LABPT, INR in the last 72 hours. No results for input(s): LABURIN in the last 72 hours. Results for orders placed or performed during the hospital encounter of 04/10/15  Culture, Urine     Status: None   Collection Time: 04/10/15  3:32 PM  Result Value Ref Range Status   Specimen Description URINE, CLEAN CATCH  Final   Special Requests NONE  Final   Culture   Final    2,000 COLONIES/mL INSIGNIFICANT GROWTH Performed at [facility]    Report Status 04/11/2015 FINAL  Final  MRSA PCR Screening     Status: None   Collection Time: 04/15/15  3:39 PM  Result Value Ref Range Status   MRSA by PCR NEGATIVE NEGATIVE Final    Comment:        The GeneXpert MRSA Assay  (FDA approved for NASAL specimens only), is one component of a comprehensive MRSA colonization surveillance program. It is not intended to diagnose MRSA infection nor to guide or monitor treatment for MRSA infections.     Studies/Results: No results found.  Assessment/Plan:  Doing much better - hoping to discharge home tomorrow  On Xarelto BID x 3 weeks  TPN off - regular diet  Normalize - transition to oral medications  I added Keflex because of wound appearance - not convinced its cellulitis although concerned enough to start it  Ostomy nurse consult for teaching one last time prior to discharge  PT/OT   LOS: 8 days   Louis Meckel W 04/18/2015, 8:28 AM

## 2015-04-18 NOTE — Progress Notes (Signed)
This CM checked in with pharmacy to ensure that [facility] DVT discharge packet was given to pt. Packet includes coupon for decreased cost for medication for pt. Packet was given by pharmacy.  Marney Doctor RN,BSN,NCM [phone]

## 2015-04-18 NOTE — Consult Note (Addendum)
WOC ostomy follow up Stoma type/location: RMQ ileal conduit Stomal assessment/size: 1 inch x 1 and 3/8 inch oval, slightly budded, red, moist with two stents intact. Red = right ureter, blue = left Peristomal assessment: intact with mild erythema.  Erythema at ends of red rubber retention sutures near stoma which is to be expected. Treatment options for stomal/peristomal skin: Use full skin barrier ring around stoma prior to pouching.  Cover ends of two retention sutures in parastomal field with 1/2 skin barrier rings prior to pouching Output: tea-colored urine in tubing and in new pouch. Ostomy pouching: 1pc.with convexity and skin barrier rings.  Use full ring around stoma, use 1/2 ring over each of two retention sutures in the parastomal field. Education provided: Patient understands the basic mechanisms of attaching his bedside urinary drainage bag to the urostomy pouch and disconnecting and is independent in this.  He had attempted to apply a pouch at home with his first stoma and will be helpful to the [facility] with this new stoma one, however, he will still require assistance with pouch preparation and application as long as the retention sutures are in place and until the stents come out post discharge. Please arrange for resumption of HHRN services/visits upon discharge. Patient has 6 pouches and 7 skin barrier rings in a packet to take home as well as a spare bedside urinary drainage bag and adapter. Enrolled patient in [place] [facility] Discharge program: Yes. [employer] is recontacted as supply needs have changed.  Requested are 4 CeraPlus barrier rings, 4 CeraPlus extended wear barrier convex pouches that cut to 1 and 1/2 inches, 2 adapters and a belt (medium). [place] nursing team will not follow, but will remain available to this patient, the nursing, surgical and medical teams.   Thanks, Maudie Flakes, MSN, RN, [place], [place], [place] [phone])

## 2015-04-18 NOTE — Evaluation (Signed)
Physical Therapy Evaluation Patient Details Name: Howard Valentine MRN: [id number] DOB: May 07, 1956 Today's Date: 04/18/2015   History of Present Illness  59 y.o. male with recent hx of open radical cystectomy and ileal conduit on 04/01/15 due to bladder cancer and readmitted for nausea and vomiting thought to be from partial SBO on 04/10/15.  Clinical Impression  Patient has been ambulating but pushing IV pole. Practiced today without with some unsteadiness. Patient would like to try with a 4 wheeled RW that may mobilize on unlevels better. Patient will benefit from PT to address problems listed in note below. Will try 4 wheeled RW next visit or leave for patient when he ambulates with the staff,.    Follow Up Recommendations Home health PT;Supervision - Intermittent    Equipment Recommendations   (possibly a 4 wheeled RW)    Recommendations for Other Services       Precautions / Restrictions Precautions Precaution Comments: urostomy      Mobility  Bed Mobility Overal bed mobility: Modified Independent             General bed mobility comments: used rail and HOB raised, reports he sleeps in a reclliner  Transfers   Equipment used: None Transfers: Sit to/from Stand Sit to Stand: Supervision         General transfer comment: verbal cues for hand placement, extra time to rise  Ambulation/Gait Ambulation/Gait assistance: Supervision;Min guard Ambulation Distance (Feet): 0 Feet         General Gait Details: ambulated x 300' without  UE support, tended to start using the rail, ambulated x 100' with IV pole. then continued x 400' with RW. patient requesting 4 wheeled RW trial.  Stairs            Wheelchair Mobility    Modified Rankin (Stroke Patients Only)       Balance Overall balance assessment: Needs assistance         Standing balance support: During functional activity;No upper extremity supported   Standing balance comment:  one balance loss  during a turn around when RW did not turn as fast.                             Pertinent Vitals/Pain Pain Score: 6  Pain Location: urostomy site when moves to sit up Pain Descriptors / Indicators: Tender;Sore Pain Intervention(s): Monitored during session;Premedicated before session    [place] expects to be discharged to:: Private residence Living Arrangements: Spouse/significant other Available Help at Discharge: Family;Available 24 hours/day Type of Home: House       Home Layout: Able to live on main level with bedroom/bathroom Home Equipment: Kasandra Knudsen - single point      Prior Function                 Hand Dominance        Extremity/Trunk Assessment   Upper Extremity Assessment: Overall WFL for tasks assessed           Lower Extremity Assessment: Overall WFL for tasks assessed      Cervical / Trunk Assessment: Kyphotic  Communication      Cognition Arousal/Alertness: Awake/alert Behavior During Therapy: WFL for tasks assessed/performed Overall Cognitive Status: Within Functional Limits for tasks assessed                      General Comments      Exercises  Assessment/Plan    PT Assessment Patient needs continued PT services  PT Diagnosis Difficulty walking   PT Problem List Decreased strength;Decreased activity tolerance;Decreased mobility;Decreased knowledge of use of DME;Pain  PT Treatment Interventions DME instruction;Gait training;Therapeutic exercise   PT Goals (Current goals can be found in the Care Plan section) Acute Rehab PT Goals Patient Stated Goal: to go home PT Goal Formulation: With patient Time For Goal Achievement: 04/20/15 Potential to Achieve Goals: Good    Frequency Min 2X/week   Barriers to discharge        Co-evaluation               End of Session   Activity Tolerance: Patient tolerated treatment well Patient left: in bed;with call bell/phone within  reach Nurse Communication: Mobility status         Time: 1223-1248 PT Time Calculation (min) (ACUTE ONLY): 25 min   Charges:   PT Evaluation $PT Re-evaluation: 1 Procedure PT Treatments $Gait Training: 8-22 mins   PT G Codes:        Claretha Cooper 04/18/2015, 2:29 PM

## 2015-04-19 LAB — CBC
HEMATOCRIT: 27.6 % — AB (ref 39.0–52.0)
HEMOGLOBIN: 9.1 g/dL — AB (ref 13.0–17.0)
MCH: 30.3 pg (ref 26.0–34.0)
MCHC: 33 g/dL (ref 30.0–36.0)
MCV: 92 fL (ref 78.0–100.0)
Platelets: 564 10*3/uL — ABNORMAL HIGH (ref 150–400)
RBC: 3 MIL/uL — AB (ref 4.22–5.81)
RDW: 14.9 % (ref 11.5–15.5)
WBC: 14.9 10*3/uL — ABNORMAL HIGH (ref 4.0–10.5)

## 2015-04-19 MED ORDER — CEPHALEXIN 250 MG/5ML PO SUSR: 500.0000 mg | Freq: Four times a day (QID) | ORAL | Status: DC

## 2015-04-19 MED ORDER — RIVAROXABAN 20 MG PO TABS: 20.0000 mg | ORAL_TABLET | Freq: Every day | ORAL | Status: DC

## 2015-04-19 MED ORDER — RIVAROXABAN 15 MG PO TABS: 15.0000 mg | ORAL_TABLET | Freq: Two times a day (BID) | ORAL | Status: DC

## 2015-04-19 MED ORDER — GABAPENTIN 300 MG PO CAPS: 300.0000 mg | ORAL_CAPSULE | Freq: Three times a day (TID) | ORAL | Status: DC

## 2015-04-19 MED ORDER — CHLORPROMAZINE HCL 10 MG PO TABS: 10.0000 mg | ORAL_TABLET | Freq: Four times a day (QID) | ORAL | Status: DC | PRN

## 2015-04-19 NOTE — Progress Notes (Signed)
4 Days Post-Op Subjective: No issues overnight Pain reasonably well controlled on oral pain medications Appetite improving No bowel movement since yesterday  Objective: Vital signs in last 24 hours: Temp:  [97.9 F (36.6 C)-98.9 F (37.2 C)] 97.9 F (36.6 C) (09/30 1352) Pulse Rate:  [101-120] 120 (09/30 1352) Resp:  [18-20] 20 (09/30 1352) BP: (129-137)/(69-75) 129/70 mmHg (09/30 1352) SpO2:  [98 %-99 %] 98 % (09/30 1352)  Intake/Output from previous day: 09/29 0701 - 09/30 0700 In: -  Out: 1400 [Urine:1400] Intake/Output this shift: Total I/O In: -  Out: 450 [Urine:450]  Physical Exam:  Ostomy is pink and viable with stents in place and the urine in his bag is blood tinged. His abdomen is non-distended, there is edema around the incision with dried drainage around the retention sutures, there is some erythema on the right side between the incision and the stoma. Right arm with ecchymosis - no pain/swelling   Lab Results:  Recent Labs  04/17/15 0525 04/18/15 0550 04/19/15 0528  HGB 8.9* 8.7* 9.1*  HCT 26.4* 25.9* 27.6*   BMET  Recent Labs  04/17/15 0525 04/18/15 0550  NA 128* 129*  K 3.7 4.4  CL 97* 98*  CO2 24 25  GLUCOSE 140* 98  BUN 9 9  CREATININE 0.56* 0.54*  CALCIUM 7.7* 7.9*   No results for input(s): LABPT, INR in the last 72 hours. No results for input(s): LABURIN in the last 72 hours. Results for orders placed or performed during the hospital encounter of 04/10/15  Culture, Urine     Status: None   Collection Time: 04/10/15  3:32 PM  Result Value Ref Range Status   Specimen Description URINE, CLEAN CATCH  Final   Special Requests NONE  Final   Culture   Final    2,000 COLONIES/mL INSIGNIFICANT GROWTH Performed at [facility]    Report Status 04/11/2015 FINAL  Final  MRSA PCR Screening     Status: None   Collection Time: 04/15/15  3:39 PM  Result Value Ref Range Status   MRSA by PCR NEGATIVE NEGATIVE Final    Comment:         The GeneXpert MRSA Assay (FDA approved for NASAL specimens only), is one component of a comprehensive MRSA colonization surveillance program. It is not intended to diagnose MRSA infection nor to guide or monitor treatment for MRSA infections.     Studies/Results: No results found.  Assessment/Plan:  WBC and plt up, ? Secondary to DVT, would like to monitor an additional day to insure that he doesn't become febrile and that he isn't developing an infection.  HR elevated ? To DVT/pain/anxiety vs. infection  On Xarelto 15mg  BID x 3 weeks and the 20mg  daily x 3 weeks for DVT  REgular diet  Keflex 500mg  q6 x 7 days  Ostomy nurse consult for teaching one last time prior to discharge  PT/OT  Hoping for discharge tomorrow   LOS: 9 days   [name] 04/19/2015, 5:53 PM

## 2015-04-19 NOTE — Discharge Summary (Addendum)
Date of admission: 04/10/2015  Date of discharge: 04/20/2015  Admission diagnosis: failure to thrive  Discharge diagnosis: Wound evisceration (s/p exploratory laparotomy and wound repair), right upper extremity DVT, Malnutrition  Secondary diagnoses:  Patient Active Problem List   Diagnosis Date Noted  . Pressure ulcer 04/11/2015  . Elevated white blood cell count 04/10/2015  . Bladder cancer 04/01/2015  . Malignant neoplasm of prostate 02/05/2015    History and Physical: For full details, please see admission history and physical. Briefly, Howard Valentine is a 59 y.o. year old patient with invasive bladder cancer who underwent cystectomy on 04/02/15.  He was readmitted on 04/12/15 through the ED for failure to thrive.  Hospital Course:  In the ED he had a wound dehiscence.  He was made NPO and started on TPN.  His ileus resolved but his wound continued to drain and the incision opened.  Seeing bowel through his incision a decision was made to proceed to the OR for wound closure.  He tolerated this well.  He then was diagnosed with an upper extremity DVT around his PICC site.  The PICC was changed to his left arm and he was started on Xarelto.  He was also started on Keflex for prophylaxis/concern of cellulitis from his oozing wound.  He remained afebrile throughout his hospitalization. His lab stabilized. He was slightly tachycardic, this was attributed to his DVT. His appetite improved, his bowels were functioning and he was eating a regular diet with adequate by mouth intake prior to discharge. He was ambulating on his own. The ostomy team had been working with the patient and he was changing his ostomy with assistance. He will need home health to assist with this initially. The patient was also kept on Thorazine throughout his hospitalization for hiccups. He will be discharged home with a prescription for oral Thorazine. He will be given close follow-up with a light urology.  PE on day of  discharge: Filed Vitals:   04/18/15 2106 04/19/15 0546 04/19/15 1352 04/20/15 0505  BP: 137/69 132/75 129/70 144/75  Pulse: 101 110 120 100  Temp: 98.9 F (37.2 C) 98.8 F (37.1 C) 97.9 F (36.6 C) 98.5 F (36.9 C)  TempSrc: Oral Oral Oral Oral  Resp: 18 18 20 16   Height:      Weight:      SpO2: 99% 99% 98% 99%    Intake/Output Summary (Last 24 hours) at 04/20/15 0836 Last data filed at 04/20/15 0505  Gross per 24 hour  Intake    400 ml  Output    450 ml  Net    -50 ml   NAD Ostomy is pink and viable with stents in place and the urine in his bag is blood tinged. His abdomen is non-distended, there is edema around the incision with dried drainage around the retention sutures, there is some erythema on the right side between the incision and the stoma. Right arm with ecchymosis - no pain/swelling  Laboratory values:   Recent Labs  04/18/15 0550 04/19/15 0528 04/20/15 0548  WBC 12.3* 14.9* 15.7*  HGB 8.7* 9.1* 8.4*  HCT 25.9* 27.6* 25.4*    Recent Labs  04/18/15 0550  NA 129*  K 4.4  CL 98*  CO2 25  GLUCOSE 98  BUN 9  CREATININE 0.54*  CALCIUM 7.9*   No results for input(s): LABPT, INR in the last 72 hours. No results for input(s): LABURIN in the last 72 hours. Results for orders placed or performed during the  hospital encounter of 04/10/15  Culture, Urine     Status: None   Collection Time: 04/10/15  3:32 PM  Result Value Ref Range Status   Specimen Description URINE, CLEAN CATCH  Final   Special Requests NONE  Final   Culture   Final    2,000 COLONIES/mL INSIGNIFICANT GROWTH Performed at [facility]    Report Status 04/11/2015 FINAL  Final  MRSA PCR Screening     Status: None   Collection Time: 04/15/15  3:39 PM  Result Value Ref Range Status   MRSA by PCR NEGATIVE NEGATIVE Final    Comment:        The GeneXpert MRSA Assay (FDA approved for NASAL specimens only), is one component of a comprehensive MRSA colonization surveillance  program. It is not intended to diagnose MRSA infection nor to guide or monitor treatment for MRSA infections.     Disposition: Home  Discharge instruction: The patient was instructed to be ambulatory but told to refrain from heavy lifting, strenuous activity, or driving.   Discharge medications:    Medication List    STOP taking these medications        ondansetron 4 MG disintegrating tablet  Commonly known as:  ZOFRAN ODT     sulfamethoxazole-trimethoprim 800-160 MG tablet  Commonly known as:  BACTRIM DS,SEPTRA DS      TAKE these medications        acetaminophen 500 MG tablet  Commonly known as:  TYLENOL  Take 500-1,000 mg by mouth every 6 (six) hours as needed for moderate pain.     calcium carbonate 750 MG chewable tablet  Commonly known as:  TUMS EX  Chew 1 tablet by mouth daily.     cephALEXin 250 MG/5ML suspension  Commonly known as:  KEFLEX  Take 10 mLs (500 mg total) by mouth every 6 (six) hours.     chlorproMAZINE 10 MG tablet  Commonly known as:  THORAZINE  Take 1 tablet (10 mg total) by mouth 4 (four) times daily as needed for hiccoughs.     docusate 50 MG/5ML liquid  Commonly known as:  COLACE  Take 20 mLs (200 mg total) by mouth 2 (two) times daily.     gabapentin 300 MG capsule  Commonly known as:  NEURONTIN  Take 1 capsule (300 mg total) by mouth 3 (three) times daily.     ondansetron 4 MG/5ML solution  Commonly known as:  ZOFRAN  Take 5 mLs (4 mg total) by mouth once.     oxyCODONE 5 MG/5ML solution  Commonly known as:  ROXICODONE  Take 5-10 mLs (5-10 mg total) by mouth every 4 (four) hours as needed for moderate pain or severe pain.     oxyCODONE 5 MG/5ML solution  Commonly known as:  ROXICODONE  Take 5-10 mLs (5-10 mg total) by mouth every 4 (four) hours as needed for moderate pain or severe pain (While NPO use toradol first).     polyethylene glycol packet  Commonly known as:  MIRALAX / GLYCOLAX  Take 17 g by mouth daily.      Rivaroxaban 15 MG Tabs tablet  Commonly known as:  XARELTO  Take 1 tablet (15 mg total) by mouth 2 (two) times daily with a meal.     rivaroxaban 20 MG Tabs tablet  Commonly known as:  XARELTO  Take 1 tablet (20 mg total) by mouth daily with supper.        Followup:  Follow-up Information    Follow  up with [name], MD On 04/23/2015.   Specialty:  Urology   Why:  For wound re-check   Contact information:   [place] [address] [phone]

## 2015-04-19 NOTE — Progress Notes (Signed)
Assumed care of pt at 1530.  Agree with day shift assessments of Howard Valentine.

## 2015-04-19 NOTE — Progress Notes (Signed)
ANTICOAGULATION CONSULT NOTE - Follow up  Pharmacy Consult for Xarelto Indication: DVT treatment  No Known Allergies  Patient Measurements: Height: 5\' 6"  (167.6 cm) Weight: 148 lb (67.132 kg) IBW/kg (Calculated) : 63.8 Heparin Dosing Weight:   Vital Signs: Temp: 98.8 F (37.1 C) (09/30 0546) Temp Source: Oral (09/30 0546) BP: 132/75 mmHg (09/30 0546) Pulse Rate: 110 (09/30 0546)  Labs:  Recent Labs  04/17/15 0525 04/18/15 0550 04/19/15 0528  HGB 8.9* 8.7* 9.1*  HCT 26.4* 25.9* 27.6*  PLT 362 411* 564*  CREATININE 0.56* 0.54*  --     Estimated Creatinine Clearance: 88.6 mL/min (by C-G formula based on Cr of 0.54).   Medical History: Past Medical History  Diagnosis Date  . Arthritis   . Hematuria   . Lower urinary tract symptoms (LUTS)   . Prostate cancer urology--  dr herrick/ dr Valere Dross    DX 2014 ---  PSA 15.2,  Gleason 6/7,  vol 28cc,  s/p radical prostatectomy and external beam radiation therapy--  recurrent , PSA 0.04 (12-28-2014)  . Nocturia   . Urinary frequency     Assessment: 70 yoM known to pharmacy for TPN dosing now with new DVT in RUE.  Pharmacy consulted to start Xarelto.  POD2 ex-lap for wound closure and ostomy revision 2/2 fascial dehiscence and evisceration.  Hgb low but stable over the last few days.  Plts WNL.  Renal function > 30 ml/min.  Discussed with Dr. Louis Meckel and plan is to wait until ~48 hours after last OR procedure to start full dose anticoagulation with xarelto.   9/30: CBC and SCr stable. No bleeding reported/documented.  Goal of Therapy:  Monitor platelets by anticoagulation protocol: Yes   Plan: Cont Xarelto 15mg  BID x 21 days then 20mg  once daily thereafter.   Counseling provided 9/28. Wife was contacted by the pharmacist. Discussed questions/concerns.  Monitor renal function, CBC, and signs and symptoms of bleeding.    Romeo Rabon, PharmD, pager [phone]. 04/19/2015,10:52 AM.

## 2015-04-19 NOTE — Progress Notes (Signed)
Nutrition Follow-up  DOCUMENTATION CODES:   Not applicable  INTERVENTION:  Pt is no longer receiving TPN, Continue Glucerna  Glucerna Shake po TID, each supplement provides 220 kcal and 10 grams of protein    NUTRITION DIAGNOSIS:   Inadequate oral intake related to inability to eat, nausea, vomiting, poor appetite as evidenced by per patient/family report, meal completion < 50%.  Ongoing  GOAL:   Patient will meet greater than or equal to 90% of their needs  Not yet met  MONITOR:   PO intake, Supplement acceptance, Labs, Weight trends, Skin, I & O's, Other (Comment) (TPN)  REASON FOR ASSESSMENT:   Consult New TPN/TNA  ASSESSMENT:   59 yo male presents for post op dehydration and poor po intake. s/p cystectomy and IC 9/12. Pt was having trouble with po intake and stop having flatus and BMs on 9/20. Pt was exhibting  nausea, vomitting, and a quick sense of fullness with meals. Pt does nto exhibit signs or symptoms of malnutrition. Pt did say ensure fills him up quickly. Pt did not provide a reason for poor appetite now that his nausea and vomitting have cleared up, outside of sense of fullness. MD wants to begin TPN tonight.  TPN has been d/c, pt says he no longer feels overly full due to TPN, Glucerna, and actual food.  Meal completion is still lacking, but pt states he is hungry and has an appetite. Is still experiencing hiccups, says that hiccup medicine 30 mins prior to eating would help him eat better.   Continue glucerna, pt seems to like, said Ensure was too rich.  Diet Order:  Diet regular Room service appropriate?: Yes; Fluid consistency:: Thin  Skin:  Wound (see comment) (Stg II Pressure Ulcer to buttocks, surgical incision on abdomen,)  Last BM:  9/26  Height:   Ht Readings from Last 1 Encounters:  04/11/15 5' 6"  (1.676 m)    Weight:   Wt Readings from Last 1 Encounters:  04/11/15 148 lb (67.132 kg)    Ideal Body Weight:  64.54 kg  BMI:  Body  mass index is 23.9 kg/(m^2).  Estimated Nutritional Needs:   Kcal:  1700-2000  Protein:  70-80 grams  Fluid:  >=/ 1.7L  EDUCATION NEEDS:   No education needs identified at this time  Satira Anis. Ward, MS, RD LDN After Hours/Weekend Pager [phone]

## 2015-04-20 LAB — CBC
HCT: 25.4 % — ABNORMAL LOW (ref 39.0–52.0)
Hemoglobin: 8.4 g/dL — ABNORMAL LOW (ref 13.0–17.0)
MCH: 30 pg (ref 26.0–34.0)
MCHC: 33.1 g/dL (ref 30.0–36.0)
MCV: 90.7 fL (ref 78.0–100.0)
PLATELETS: 526 10*3/uL — AB (ref 150–400)
RBC: 2.8 MIL/uL — ABNORMAL LOW (ref 4.22–5.81)
RDW: 14.8 % (ref 11.5–15.5)
WBC: 15.7 10*3/uL — ABNORMAL HIGH (ref 4.0–10.5)

## 2015-04-20 MED ORDER — OXYCODONE HCL 5 MG/5ML PO SOLN: 5.0000 mg | ORAL | Status: DC | PRN

## 2015-04-20 NOTE — Discharge Instructions (Signed)
Information on my medicine - XARELTO (rivaroxaban)  This medication education was reviewed with me or my healthcare representative as part of my discharge preparation.  The pharmacist that spoke with me during my hospital stay was:  Rudean Haskell, [place]? Xarelto was prescribed to treat blood clots that may have been found in the veins of your legs (deep vein thrombosis) or in your lungs (pulmonary embolism) and to reduce the risk of them occurring again.  What do you need to know about Xarelto? The starting dose is one 15 mg tablet taken TWICE daily with food for the FIRST 21 DAYS then on May 08, 2015  the dose is changed to one 20 mg tablet taken ONCE A DAY with your evening meal.  DO NOT stop taking Xarelto without talking to the health care provider who prescribed the medication.  Refill your prescription for 20 mg tablets before you run out.  After discharge, you should have regular check-up appointments with your healthcare provider that is prescribing your Xarelto.  In the future your dose may need to be changed if your kidney function changes by a significant amount.  What do you do if you miss a dose? If you are taking Xarelto TWICE DAILY and you miss a dose, take it as soon as you remember. You may take two 15 mg tablets (total 30 mg) at the same time then resume your regularly scheduled 15 mg twice daily the next day.  If you are taking Xarelto ONCE DAILY and you miss a dose, take it as soon as you remember on the same day then continue your regularly scheduled once daily regimen the next day. Do not take two doses of Xarelto at the same time.   Important Safety Information Xarelto is a blood thinner medicine that can cause bleeding. You should call your healthcare provider right away if you experience any of the following: ? Bleeding from an injury or your nose that does not stop. ? Unusual colored urine (red or dark brown) or unusual  colored stools (red or black). ? Unusual bruising for unknown reasons. ? A serious fall or if you hit your head (even if there is no bleeding).  Some medicines may interact with Xarelto and might increase your risk of bleeding while on Xarelto. To help avoid this, consult your healthcare provider or pharmacist prior to using any new prescription or non-prescription medications, including herbals, vitamins, non-steroidal anti-inflammatory drugs (NSAIDs) and supplements.  This website has more information on Xarelto: [email or web address]  Bladder Cancer Bladder cancer is an abnormal growth of tissue in your bladder. Your bladder is the balloon-like sac in your pelvis. It collects and stores urine that comes from the kidneys through the ureters. The bladder wall is made of layers. If cancer spreads into these layers and through the wall of the bladder, it becomes more difficult to treat.  There are four stages of bladder cancer:  Stage I. Cancer at this stage occurs in the bladder's inner lining but has not invaded the muscular bladder wall.  Stage II. At this stage, cancer has invaded the bladder wall but is still confined to the bladder.  Stage III. By this stage, the cancer cells have spread through the bladder wall to surrounding tissue. They may also have spread to the prostate in men or the uterus or vagina in women.  Stage IV. By this stage, cancer cells may have spread to the lymph nodes and other organs,  such as your lungs, bones, or liver. RISK FACTORS Although the cause of bladder cancer is not known, the following risk factors can increase your chances of getting bladder cancer:   Smoking.   Occupational exposures, such as rubber, leather, textile, dyes, chemicals, and paint.  Being white.  Age.   Being male.   Having chronic bladder inflammation.   Having a bladder cancer history.   Having a family history of bladder cancer (heredity).   Having had chemotherapy or  radiation therapy to the pelvis.   Being exposed to arsenic.  SYMPTOMS   Blood in the urine.   Pain with urination.   Frequent bladder or urine infections.  Increase in urgency and frequency of urination. DIAGNOSIS  Your health care provider may suspect bladder cancer based on your description of urinary symptoms or based on the finding of blood or infection in the urine (especially if this has recurred several times). Other tests or procedures that may be performed include:   A narrow tube being inserted into your bladder through your urethra (cystoscopy) in order to view the lining of your bladder for tumors.   A biopsy to sample the tumor to see if cancer is present.  If cancer is present, it will then be staged to determine its severity and extent. It is important to know how deeply into the bladder wall the cancer has grown and whether the cancer has spread to any other parts of your body. Staging may require blood tests or special scans such as a CT scan, MRI, bone scan, or chest X-ray.  TREATMENT  Once your cancer has been diagnosed and staged, you should discuss a treatment plan with your health care provider. Based on the stage of the cancer, one treatment or a combination of treatments may be recommended. The most common forms of treatment are:   Surgery. Procedures that may be done include transurethral resection and cystectomy.  Radiation therapy. This is infrequently used to treat bladder cancer.   Chemotherapy. During this treatment, drugs are used to kill cancer cells.  Immunotherapy. This is usually administered directly into the bladder. HOME CARE INSTRUCTIONS  Take medicines only as directed by your health care provider.   Maintain a healthy diet.   Consider joining a support group. This may help you learn to cope with the stress of having bladder cancer.   Seek advice to help you manage treatment side effects.   Keep all follow-up visits as  directed by your health care provider.   Inform your cancer specialist if you are admitted to the hospital.  Guy IF:  There is blood in your urine.  You have symptoms of a urinary tract infection. These include:  Tiredness.  Shakiness.  Weakness.  Muscle aches.  Abdominal pain.  Frequent and intense urge to urinate (in young women).  Burning feeling in the bladder or urethra during urination (in young women). SEEK IMMEDIATE MEDICAL CARE IF:  You are unable to urinate. Document Released: 07/09/2003 Document Revised: 11/20/2013 Document Reviewed: 12/27/2012 [facility] Patient Information 2015 [place], [place]. This information is not intended to replace advice given to you by your health care provider. Make sure you discuss any questions you have with your health care provider.  Cystectomy With Ileal Conduit Urinary Diversion Cystectomy with ileal conduit urinary diversion is a surgical procedure to remove your bladder and redirect urine into a pouch worn on your abdomen. Urine normally flows from your kidneys through two tubes (ureters) into your bladder.  Urine is stored in your bladder and flows out of your body through a tube (urethra). After this procedure, your urine will empty through an opening (urostomy) on the lower right side of your abdomen.  LET [facility] CARE PROVIDER KNOW ABOUT:  Any allergies you have.  All medicines you are taking, including vitamins, herbs, eye drops, creams, and over-the-counter medicines.  Previous problems you or members of your family have had with the use of anesthetics.  Any blood disorders you have.  Previous surgeries you have had.  Medical conditions you have. RISKS AND COMPLICATIONS  Generally, this is a safe procedure. However, as with any procedure, problems can occur. Possible problems include:  Bleeding.  Infection.  Leaking of urine or intestinal contents at the site of the new connection  (anastomosis).  Bowel obstruction.  Sexual problems, including difficulty getting an erection.  Damage to other organs in your abdomen.  A blood clot that forms in a vein in your leg (deep vein thrombosis).  A blood clot that forms and travels to your lung (pulmonary embolism). BEFORE THE PROCEDURE  The ostomy to drain your urine after surgery is permanent. You will always have it. You may meet with an ostomy health care provider before surgery to learn how to care for your ostomy and ostomy pouch.  Quit smoking, if you smoke.  Ask your health care provider about changing or stopping your regular medicines. You may need to stop taking aspirin or blood-thinning medicines 7-10 days before surgery. You may need to take antibiotic medicine by mouth before surgery.  You may need to follow a special diet and take medicine to clean out your digestive system (bowel prep). You may need to start your bowel prep 1-3 days before the procedure.  You may have a urine test. This is to make sure there are no bacteria in your urine.  Do not eat or drink anything for 6-8 hours before the procedure. PROCEDURE  This surgery is done in the hospital and may take several hours:  You will get medicine that makes you go to sleep (general anesthetic). You will have an IV tube placed. It may be placed into a large vein in your neck or chest (central venous catheter). You will get medicines, fluids, and blood, if needed, through the IV tube. You may also get antibiotic medicine through this tube.  The surgeon will make a cut (incision) from the area near your belly button down to the lowest part of your abdomen.  The ends of your ureters that empty into your bladder will be separated from your bladder. Then the bladder will be removed. If you are having this procedure for cancer treatment, lymph nodes near your bladder also may be removed. In men, the prostate gland and sperm sacs may be removed. In women, part of  the vagina may be removed. Both men and women may have part of their urethra removed.  A section of your small intestine (ileum) will be cut out. This will be the ileal conduit.  Your small intestine will be reconnected with sutures or staples.  The ileal conduit will be closed off at one end. Then the ends of the ureters will be sutured to it.  The open end of the ileal conduit will be brought through an opening that will be made in your abdomen. The conduit will be sutured to this opening with absorbable sutures. This will be the urostomy.  The surgeon will place two tubes (stents) through the  urostomy and into the two ureters. This will keep them open.  A urine collection bag will be placed over the urostomy.  Drainage tubes (usually one on each side) will be placed in your abdomen to drain blood or fluid that collects after surgery. These will be attached to collection bulbs.  The surgeon will use staples or sutures to close the incision through which your bladder was removed. A bandage will be applied. AFTER THE PROCEDURE  After surgery, you will remain in the recovery area until the anesthesia wears off.  You will go to a hospital room or to surgical intensive care once the anesthesia wears off. You may need to stay in the hospital for at least 7 days.  You may have to wear inflatable stockings at first to help prevent blood clots.  You will be encouraged to walk after you can get out of bed. This also helps prevent blood clots.  You will have an IV tube for fluids and nutrition until you can start eating and drinking on your own.  You will get medication to control pain and to prevent infections and blood clots.  You can go home when you can eat regular food and your digestive system and urostomy are working well. Document Released: 07/11/2013 Document Revised: 11/20/2013 Document Reviewed: 07/11/2013 [facility] Patient Information 2015 [place], [place]. This information is not  intended to replace advice given to you by your health care provider. Make sure you discuss any questions you have with your health care provider.

## 2015-04-20 NOTE — Progress Notes (Signed)
Patient discharged to home with family, discharge instructions reviewed with patient and wife who verbalized understanding. New RX given to patient.

## 2015-04-20 NOTE — Care Management Note (Signed)
Case Management Note  Patient Details  Name: Howard Valentine MRN: [id number] Date of Birth: 1956/01/12  Subjective/Objective:      right upper extremity DVT              Action/Plan: Home Health  Expected Discharge Date:  04/20/2015             Expected Discharge Plan:  [place]  In-House Referral:     Discharge planning Services  CM Consult  Post Acute Care Choice:  Home Health, Resumption of Svcs/PTA Provider Choice offered to:  Patient   HH Arranged:  RN [facility] Agency:  [place]  Status of Service:  Completed, signed off  Medicare Important Message Given:    Date Medicare IM Given:    Medicare IM give by:    Date Additional Medicare IM Given:    Additional Medicare Important Message give by:     If discussed at [place] of Stay Meetings, dates discussed:    Additional Comments: NCM spoke to pt and active with [facility]. [place] and they do have medications in stock. Pt has 30 day free trial card for Xarelto and copay card. Contacted [facility] to make aware of scheduled dc home today with [facility] RN.  Erenest Rasher, RN 04/20/2015, 11:06 AM

## 2015-04-29 ENCOUNTER — Telehealth: Payer: Self-pay | Admitting: *Deleted

## 2015-04-29 NOTE — Telephone Encounter (Signed)
Spoke with patient reminding him off his appointment with Dr. Alen Blew tomorrow. Patient verbalized understanding.

## 2015-04-30 ENCOUNTER — Ambulatory Visit (HOSPITAL_BASED_OUTPATIENT_CLINIC_OR_DEPARTMENT_OTHER): Payer: BLUE CROSS/BLUE SHIELD | Admitting: Oncology

## 2015-04-30 ENCOUNTER — Telehealth: Payer: Self-pay | Admitting: Oncology

## 2015-04-30 VITALS — BP 105/71 | HR 105 | Temp 97.7°F | Resp 20 | Ht 66.0 in | Wt 130.7 lb

## 2015-04-30 DIAGNOSIS — C679 Malignant neoplasm of bladder, unspecified: Secondary | ICD-10-CM

## 2015-04-30 DIAGNOSIS — I8290 Acute embolism and thrombosis of unspecified vein: Secondary | ICD-10-CM

## 2015-04-30 DIAGNOSIS — R972 Elevated prostate specific antigen [PSA]: Secondary | ICD-10-CM

## 2015-04-30 DIAGNOSIS — C61 Malignant neoplasm of prostate: Secondary | ICD-10-CM

## 2015-04-30 DIAGNOSIS — Z8546 Personal history of malignant neoplasm of prostate: Secondary | ICD-10-CM

## 2015-04-30 NOTE — Telephone Encounter (Signed)
Gave adn printed appt sched and avs fo rpt for OCT...gv barium °

## 2015-04-30 NOTE — Progress Notes (Signed)
Please see consult note.  

## 2015-04-30 NOTE — Consult Note (Signed)
Reason for Referral: Bladder cancer  HPI: 59 year old gentleman in reasonably good health and shape for the majority of his wife. He was diagnosed with prostate cancer back in 2014 after presenting with an elevated PSA of 15.2 in April 2014. Initial biopsy showed a Gleason score 3+3 = 6 and 11 out of 12 cores. He subsequently underwent robotic-assisted laparoscopic radical prostatectomy done on 02/08/2013 performed by Dr. Jasmine December. The pathology revealed prostate cancer adenocarcinoma Gleason score 3+4 = 7 and a pathological staging at that time of stage T2c N0. His PSA dropped down dramatically to 0 but most recently in June 2016 showed slight elevation up to 0.04. He was under possible evaluation for adjuvant radiation therapy but started developing hematuria. He was evaluated by Dr. Louis Meckel with a cystoscopy which showed invasive high-grade urothelial carcinoma invading into the muscle. 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 The carcinoma invades through the muscularis propria and present at the serosal surfaces. 2 lymph nodes examined with 1 involvement of the tumor at the left external iliac chain. The final pathological staging was T3a N1. Patient recovered slowly and had a complication with wound dehiscence and required repeat exploratory laparotomy, closure of a fascial dehiscence and colostomy revision. During his hospital stay he was diagnosed with a deep vein thrombosis associated with a PICC line and was treated with Xarelto. He was discharged on 04/20/2015. Clinically, he has been recovering slowly with some fatigue and tiredness. He did report occasional hematuria and I'll does report very intermittent hematochezia. Does not report any skeletal complaints at this time. His performance status is limited but is improving. He is able to walk short distances but not able to drive.  He does not  report any headaches, blurry vision, syncope or seizures. He does not report any fevers, chills, sweats does report about 8 pound weight loss. He does not report any chest pain, palpitation, orthopnea but does report leg edema. He does not report any cough, hemoptysis or wheezing. Does not report any nausea, vomiting, abdominal pain, hematochezia or bleeding per rectum. He does not report any frequency, urgency or hesitancy. He does not report any skeletal complaints. Does not report any arthralgias or myalgias. Does not report any lymphadenopathy or petechiae. Remainder review of systems unremarkable.   Past Medical History  Diagnosis Date  . Arthritis   . Hematuria   . Lower urinary tract symptoms (LUTS)   . Prostate cancer urology--  dr herrick/ dr Valere Dross    DX 2014 ---  PSA 15.2,  Gleason 6/7,  vol 28cc,  s/p radical prostatectomy and external beam radiation therapy--  recurrent , PSA 0.04 (12-28-2014)  . Nocturia   . Urinary frequency   :  Past Surgical History  Procedure Laterality Date  . Prostate biopsy    . Robot assisted laparoscopic radical prostatectomy N/A 02/08/2013    Procedure: ROBOTIC ASSISTED LAPAROSCOPIC RADICAL PROSTATECTOMY LEVEL 2;  Surgeon: Molli Hazard, MD;  Location: [facility];  Service: Urology;  Laterality: N/A;  ROBOTIC PROSTATECTOMY, BILATERAL PELVIC LYMPH NODE DISSECTION, RIGHT NON NERVE SPARING   . Lymphadenectomy Bilateral 02/08/2013    Procedure: LYMPHADENECTOMY;  Surgeon: Molli Hazard, MD;  Location: [facility];  Service: Urology;  Laterality: Bilateral;  . Cystoscopy with retrograde pyelogram, ureteroscopy and stent placement Bilateral 03/01/2015    Procedure: CYSTOSCOPY WITH BLADDER BIOPSY RETROGRADE BILATERAL  PYELOGRAM;  Surgeon: [name], MD;  Location: [facility];  Service:  Urology;  Laterality: Bilateral;  . Cystectomy N/A 04/01/2015    Procedure: CYSTECTOMY WITH ILEAL CONDUIT ;  Surgeon: [name], MD;   Location: [facility];  Service: Urology;  Laterality: N/A;  . Irrigation and debridement abscess N/A 04/15/2015    Procedure: CLOSURE OF WOUND DEHISCENCE;  Surgeon: [name], MD;  Location: [facility];  Service: Urology;  Laterality: N/A;  :   Current outpatient prescriptions:  .  acetaminophen (TYLENOL) 500 MG tablet, Take 500-1,000 mg by mouth every 6 (six) hours as needed for moderate pain., Disp: , Rfl:  .  calcium carbonate (TUMS EX) 750 MG chewable tablet, Chew 1 tablet by mouth daily., Disp: , Rfl:  .  chlorproMAZINE (THORAZINE) 10 MG tablet, Take 1 tablet (10 mg total) by mouth 4 (four) times daily as needed for hiccoughs., Disp: 30 tablet, Rfl: 1 .  docusate (COLACE) 50 MG/5ML liquid, Take 20 mLs (200 mg total) by mouth 2 (two) times daily., Disp: 473 mL, Rfl: 0 .  gabapentin (NEURONTIN) 300 MG capsule, Take 1 capsule (300 mg total) by mouth 3 (three) times daily., Disp: 60 capsule, Rfl: 0 .  ondansetron (ZOFRAN) 4 MG/5ML solution, Take 5 mLs (4 mg total) by mouth once. (Patient taking differently: Take 4 mg by mouth once. As needed for nausea  / vomiting), Disp: 50 mL, Rfl: 0 .  oxyCODONE (ROXICODONE) 5 MG/5ML solution, Take 5-10 mLs (5-10 mg total) by mouth every 4 (four) hours as needed for moderate pain or severe pain., Disp: 473 mL, Rfl: 0 .  oxyCODONE (ROXICODONE) 5 MG/5ML solution, Take 5-10 mLs (5-10 mg total) by mouth every 4 (four) hours as needed for moderate pain or severe pain (While NPO use toradol first)., Disp: 250 mL, Rfl: 0 .  polyethylene glycol (MIRALAX / GLYCOLAX) packet, Take 17 g by mouth daily., Disp: 30 each, Rfl: 0 .  Rivaroxaban (XARELTO) 15 MG TABS tablet, Take 1 tablet (15 mg total) by mouth 2 (two) times daily with a meal., Disp: 42 tablet, Rfl: 0:  No Known Allergies:  Family History  Problem Relation Age of Onset  . Colon cancer    :  Social History   Social History  . Marital Status: Married    Spouse Name: N/A  . Number of Children: N/A  .  Years of Education: N/A   Occupational History  . Not on file.   Social History Main Topics  . Smoking status: Former Smoker -- 1.00 packs/day for 20 years    Types: Cigarettes    Quit date: 02/25/2000  . Smokeless tobacco: Never Used  . Alcohol Use: 12.6 oz/week    21 Cans of beer per week     Comment: 3 beers daily  . Drug Use: No  . Sexual Activity: No   Other Topics Concern  . Not on file   Social History Narrative  :  Pertinent items are noted in HPI.  Exam: ECOG 1 Blood pressure 105/71, pulse 105, temperature 97.7 F (36.5 C), temperature source Oral, resp. rate 20, height 5\' 6"  (1.676 m), weight 130 lb 11.2 oz (59.285 kg), SpO2 100 %. General appearance: alert and cooperative chronically ill-appearing without distress today. Head: Normocephalic, without obvious abnormality  Throat: lips, mucosa, and tongue normal; teeth and gums normal no oral ulcers or lesions. Neck: no adenopathy Back: negative Resp: clear to auscultation bilaterally Chest wall: no tenderness Cardio: regular rate and rhythm, S1, S2 normal, no murmur, click, rub or gallop slightly tachycardic. Extremities: extremities  normal, atraumatic, 1+ edema noted bilaterally. Pulses: 2+ and symmetric Skin: Skin color, texture, turgor normal. No rashes or lesions Lymph nodes: Cervical, supraclavicular, and axillary nodes normal.   CBC    Component Value Date/Time   WBC 15.7* 04/20/2015 0548   RBC 2.80* 04/20/2015 0548   HGB 8.4* 04/20/2015 0548   HCT 25.4* 04/20/2015 0548   PLT 526* 04/20/2015 0548   MCV 90.7 04/20/2015 0548   MCH 30.0 04/20/2015 0548   MCHC 33.1 04/20/2015 0548   RDW 14.8 04/20/2015 0548   LYMPHSABS 0.5* 04/15/2015 0455   MONOABS 1.7* 04/15/2015 0455   EOSABS 0.2 04/15/2015 0455   BASOSABS 0.0 04/15/2015 0455      Chemistry      Component Value Date/Time   NA 129* 04/18/2015 0550   K 4.4 04/18/2015 0550   CL 98* 04/18/2015 0550   CO2 25 04/18/2015 0550   BUN 9  04/18/2015 0550   CREATININE 0.54* 04/18/2015 0550      Component Value Date/Time   CALCIUM 7.9* 04/18/2015 0550   ALKPHOS 71 04/18/2015 0550   AST 24 04/18/2015 0550   ALT 14* 04/18/2015 0550   BILITOT 0.3 04/18/2015 0550        Ct Abdomen Pelvis W Contrast  04/11/2015   CLINICAL DATA:  Changes small bowel obstruction on recent plain film examination.  EXAM: CT ABDOMEN AND PELVIS WITH CONTRAST  TECHNIQUE: Multidetector CT imaging of the abdomen and pelvis was performed using the standard protocol following bolus administration of intravenous contrast.  CONTRAST:  123mL OMNIPAQUE IOHEXOL 300 MG/ML  SOLN  COMPARISON:  03/12/2015  FINDINGS: Lung bases demonstrate mild bibasilar atelectasis of an acute nature and small pleural effusions. The liver, gallbladder, spleen, adrenal glands and pancreas are within normal limits. The kidneys are well visualized bilaterally with the ureteral stents in place. No obstructive changes are noted. The ureteral stents extend into an ileostomy in the right mid abdomen.  Diffuse small bowel dilatation involving the jejunum and proximal ileum is identified. One of the loops courses into a a small area of herniation just below the recent surgical incision although no incarceration is noted. A transition zone lies in the right mid abdomen adjacent to the urostomy loop. Given the relative recent surgery this is not felt to represent adhesions the more distal small bowel is within normal limits. The colon demonstrates some mild focal areas of wall thickening in the postoperative site likely related to postoperative inflammatory change. A minimal amount of free pelvic fluid is seen. The bladder has been surgically removed. No significant lymphadenopathy is seen. The osseous structures show no acute abnormality.  IMPRESSION: Changes consistent with small bowel dilatation secondary to a transition zone in the distal ileum adjacent to the patient's known urostomy loop.  Small  herniation beneath the recent surgical incision in the midline although no incarceration is noted.  Mild free pelvic fluid within the pelvis likely postoperative in nature. No focal abscess is identified.  Mild acute basilar atelectasis and small associated effusions.   Electronically Signed   By: Inez Catalina M.D.   On: 04/11/2015 14:15      Assessment and Plan:    59 year old gentleman with the following issues:  1. Invasive high-grade urothelial carcinoma of the bladder diagnosed in August 2016. He presented with hematuria and found to have a biopsy-proven muscle invasive disease. He underwent a radical cystectomy on 04/01/2015 and the pathology revealed a T3 N1 disease of high-grade urothelial carcinoma with plasmacytoid variant.  The natural course of this disease was discussed with the patient and his wife. He understands that he has a rather aggressive variant of bladder cancer. Radical cystectomy as a first step of treating this disease and likely will require adjuvant chemotherapy. He did have a CT scan done on 04/10/2012 and did not show evidence of intra-abdominal metastasis.  The rationale of using adjuvant chemotherapy in bladder cancer in general was reviewed. There is really no convincing evidence that adjuvant chemotherapy impacts survival but certainly there is plenty of evidence in the neoadjuvant setting. Given the patient's age, excellent health and aggressive nature of his disease he would be reasonable to offer him cisplatin-based chemotherapy. There are a few case reports this supports the use of gemcitabine and cisplatin in this particular setting.  I anticipate using 2-3 cycles of chemotherapy spanning 9-12 weeks at most.  Before the start of chemotherapy will have to restage him to make sure he has not developed systemic disease. She has indeed metastatic disease, chemotherapy will play a role but likely palliative rather than curative.  He also needs to recover from his  recent complications and improve his nutritional status. I anticipate the start of chemotherapy close to early November at this time.  The complications associated with this chemotherapy was reviewed today including nausea, vomiting, myelosuppression, wound healing complications, and others. For this reason it's important for him to fully recover before starting chemotherapy.  2. History of prostate cancer: He had a mild rise in his PSA that needs to be monitored for the time being. Adjuvant radiation therapy could be utilized to treat his local recurrence of prostate cancer and potential adjuvant therapy for his bladder cancer. This will be discussed further upon his recovery.  3. Deep vein thrombosis: This is associated with catheter insertion. I agree with Xarelto for 3 months of therapy. This can be extend further if he has indeed pulmonary embolism which I doubt that this time. Imaging studies will determine this in the near future.  4. Poor nutrition: This was discussed today in detail and maneuvers to improve his nutritional intake were discussed with the patient and his wife. It is important to make sure that he is getting adequate nutrition for wound healing and potential chemotherapy in the near future.  5. Renal function prophylaxis: He has excellent creatinine at this time and would like to avoid nephrotoxic drugs and anticipates of possible platinum therapy.  6. Prognosis: He has a rather aggressive cancer but still the treatment at this time has a curative intent.  All his questions were answered and he'll follow-up in the near future after imaging studies.

## 2015-05-19 ENCOUNTER — Encounter (HOSPITAL_COMMUNITY): Payer: Self-pay

## 2015-05-19 ENCOUNTER — Inpatient Hospital Stay (HOSPITAL_COMMUNITY): Payer: BLUE CROSS/BLUE SHIELD

## 2015-05-19 ENCOUNTER — Inpatient Hospital Stay (HOSPITAL_COMMUNITY)
Admission: EM | Admit: 2015-05-19 | Discharge: 2015-05-24 | DRG: 871 | Disposition: A | Discharge status: Home Health | Payer: BLUE CROSS/BLUE SHIELD | Attending: Internal Medicine | Admitting: Internal Medicine

## 2015-05-19 ENCOUNTER — Emergency Department (HOSPITAL_COMMUNITY): Payer: BLUE CROSS/BLUE SHIELD

## 2015-05-19 DIAGNOSIS — K5900 Constipation, unspecified: Secondary | ICD-10-CM | POA: Diagnosis not present

## 2015-05-19 DIAGNOSIS — E869 Volume depletion, unspecified: Secondary | ICD-10-CM | POA: Diagnosis present

## 2015-05-19 DIAGNOSIS — Z6823 Body mass index (BMI) 23.0-23.9, adult: Secondary | ICD-10-CM | POA: Diagnosis not present

## 2015-05-19 DIAGNOSIS — N12 Tubulo-interstitial nephritis, not specified as acute or chronic: Secondary | ICD-10-CM | POA: Diagnosis present

## 2015-05-19 DIAGNOSIS — R6521 Severe sepsis with septic shock: Secondary | ICD-10-CM | POA: Diagnosis present

## 2015-05-19 DIAGNOSIS — E876 Hypokalemia: Secondary | ICD-10-CM | POA: Diagnosis present

## 2015-05-19 DIAGNOSIS — I82409 Acute embolism and thrombosis of unspecified deep veins of unspecified lower extremity: Secondary | ICD-10-CM | POA: Diagnosis present

## 2015-05-19 DIAGNOSIS — A4150 Gram-negative sepsis, unspecified: Principal | ICD-10-CM | POA: Diagnosis present

## 2015-05-19 DIAGNOSIS — C679 Malignant neoplasm of bladder, unspecified: Secondary | ICD-10-CM | POA: Diagnosis present

## 2015-05-19 DIAGNOSIS — Z1612 Extended spectrum beta lactamase (ESBL) resistance: Secondary | ICD-10-CM | POA: Diagnosis not present

## 2015-05-19 DIAGNOSIS — Z79891 Long term (current) use of opiate analgesic: Secondary | ICD-10-CM | POA: Diagnosis not present

## 2015-05-19 DIAGNOSIS — R112 Nausea with vomiting, unspecified: Secondary | ICD-10-CM | POA: Diagnosis not present

## 2015-05-19 DIAGNOSIS — E872 Acidosis: Secondary | ICD-10-CM | POA: Diagnosis present

## 2015-05-19 DIAGNOSIS — M545 Low back pain: Secondary | ICD-10-CM | POA: Diagnosis present

## 2015-05-19 DIAGNOSIS — A419 Sepsis, unspecified organism: Secondary | ICD-10-CM | POA: Diagnosis not present

## 2015-05-19 DIAGNOSIS — K219 Gastro-esophageal reflux disease without esophagitis: Secondary | ICD-10-CM | POA: Diagnosis present

## 2015-05-19 DIAGNOSIS — Z7901 Long term (current) use of anticoagulants: Secondary | ICD-10-CM

## 2015-05-19 DIAGNOSIS — J690 Pneumonitis due to inhalation of food and vomit: Secondary | ICD-10-CM | POA: Diagnosis not present

## 2015-05-19 DIAGNOSIS — A4151 Sepsis due to Escherichia coli [E. coli]: Secondary | ICD-10-CM | POA: Diagnosis not present

## 2015-05-19 DIAGNOSIS — Z906 Acquired absence of other parts of urinary tract: Secondary | ICD-10-CM

## 2015-05-19 DIAGNOSIS — A499 Bacterial infection, unspecified: Secondary | ICD-10-CM | POA: Diagnosis not present

## 2015-05-19 DIAGNOSIS — G8929 Other chronic pain: Secondary | ICD-10-CM | POA: Diagnosis present

## 2015-05-19 DIAGNOSIS — Z87891 Personal history of nicotine dependence: Secondary | ICD-10-CM

## 2015-05-19 DIAGNOSIS — Z800 Family history of malignant neoplasm of digestive organs: Secondary | ICD-10-CM

## 2015-05-19 DIAGNOSIS — N390 Urinary tract infection, site not specified: Secondary | ICD-10-CM | POA: Insufficient documentation

## 2015-05-19 DIAGNOSIS — E43 Unspecified severe protein-calorie malnutrition: Secondary | ICD-10-CM | POA: Diagnosis not present

## 2015-05-19 DIAGNOSIS — R55 Syncope and collapse: Secondary | ICD-10-CM

## 2015-05-19 DIAGNOSIS — R6510 Systemic inflammatory response syndrome (SIRS) of non-infectious origin without acute organ dysfunction: Secondary | ICD-10-CM | POA: Insufficient documentation

## 2015-05-19 DIAGNOSIS — Z79899 Other long term (current) drug therapy: Secondary | ICD-10-CM

## 2015-05-19 DIAGNOSIS — A280 Pasteurellosis: Secondary | ICD-10-CM | POA: Diagnosis present

## 2015-05-19 DIAGNOSIS — Z2239 Carrier of other specified bacterial diseases: Secondary | ICD-10-CM | POA: Insufficient documentation

## 2015-05-19 DIAGNOSIS — Z9079 Acquired absence of other genital organ(s): Secondary | ICD-10-CM | POA: Diagnosis not present

## 2015-05-19 DIAGNOSIS — B9620 Unspecified Escherichia coli [E. coli] as the cause of diseases classified elsewhere: Secondary | ICD-10-CM | POA: Diagnosis present

## 2015-05-19 DIAGNOSIS — I82621 Acute embolism and thrombosis of deep veins of right upper extremity: Secondary | ICD-10-CM | POA: Diagnosis present

## 2015-05-19 DIAGNOSIS — M1990 Unspecified osteoarthritis, unspecified site: Secondary | ICD-10-CM | POA: Diagnosis present

## 2015-05-19 DIAGNOSIS — R7881 Bacteremia: Secondary | ICD-10-CM

## 2015-05-19 DIAGNOSIS — R64 Cachexia: Secondary | ICD-10-CM | POA: Diagnosis present

## 2015-05-19 DIAGNOSIS — Z8546 Personal history of malignant neoplasm of prostate: Secondary | ICD-10-CM

## 2015-05-19 DIAGNOSIS — Z932 Ileostomy status: Secondary | ICD-10-CM | POA: Diagnosis not present

## 2015-05-19 DIAGNOSIS — A4189 Other specified sepsis: Secondary | ICD-10-CM | POA: Diagnosis not present

## 2015-05-19 DIAGNOSIS — K828 Other specified diseases of gallbladder: Secondary | ICD-10-CM | POA: Diagnosis not present

## 2015-05-19 DIAGNOSIS — R8271 Bacteriuria: Secondary | ICD-10-CM | POA: Insufficient documentation

## 2015-05-19 LAB — URINALYSIS, ROUTINE W REFLEX MICROSCOPIC
BILIRUBIN URINE: NEGATIVE
GLUCOSE, UA: NEGATIVE mg/dL
KETONES UR: NEGATIVE mg/dL
Nitrite: POSITIVE — AB
PROTEIN: NEGATIVE mg/dL
Specific Gravity, Urine: 1.011 (ref 1.005–1.030)
UROBILINOGEN UA: 1 mg/dL (ref 0.0–1.0)
pH: 7 (ref 5.0–8.0)

## 2015-05-19 LAB — CBC WITH DIFFERENTIAL/PLATELET
BASOS ABS: 0 10*3/uL (ref 0.0–0.1)
Basophils Relative: 0 %
EOS PCT: 0 %
Eosinophils Absolute: 0 10*3/uL (ref 0.0–0.7)
HEMATOCRIT: 32.5 % — AB (ref 39.0–52.0)
Hemoglobin: 10.3 g/dL — ABNORMAL LOW (ref 13.0–17.0)
LYMPHS ABS: 0.7 10*3/uL (ref 0.7–4.0)
Lymphocytes Relative: 3 %
MCH: 27.8 pg (ref 26.0–34.0)
MCHC: 31.7 g/dL (ref 30.0–36.0)
MCV: 87.6 fL (ref 78.0–100.0)
MONO ABS: 1.5 10*3/uL — AB (ref 0.1–1.0)
Monocytes Relative: 6 %
NEUTROS ABS: 21.1 10*3/uL — AB (ref 1.7–7.7)
Neutrophils Relative %: 91 %
Platelets: 440 10*3/uL — ABNORMAL HIGH (ref 150–400)
RBC: 3.71 MIL/uL — AB (ref 4.22–5.81)
RDW: 15.2 % (ref 11.5–15.5)
WBC: 23.3 10*3/uL — AB (ref 4.0–10.5)

## 2015-05-19 LAB — LACTIC ACID, PLASMA
LACTIC ACID, VENOUS: 1.8 mmol/L (ref 0.5–2.0)
LACTIC ACID, VENOUS: 1.1 mmol/L (ref 0.5–2.0)

## 2015-05-19 LAB — URINE MICROSCOPIC-ADD ON

## 2015-05-19 LAB — CBG MONITORING, ED: Glucose-Capillary: 103 mg/dL — ABNORMAL HIGH (ref 65–99)

## 2015-05-19 LAB — COMPREHENSIVE METABOLIC PANEL
ALT: 44 U/L (ref 17–63)
AST: 48 U/L — AB (ref 15–41)
Albumin: 3.3 g/dL — ABNORMAL LOW (ref 3.5–5.0)
Alkaline Phosphatase: 779 U/L — ABNORMAL HIGH (ref 38–126)
Anion gap: 8 (ref 5–15)
BILIRUBIN TOTAL: 0.6 mg/dL (ref 0.3–1.2)
BUN: 9 mg/dL (ref 6–20)
CALCIUM: 9 mg/dL (ref 8.9–10.3)
CO2: 25 mmol/L (ref 22–32)
CREATININE: 0.8 mg/dL (ref 0.61–1.24)
Chloride: 102 mmol/L (ref 101–111)
Glucose, Bld: 114 mg/dL — ABNORMAL HIGH (ref 65–99)
Potassium: 4.4 mmol/L (ref 3.5–5.1)
Sodium: 135 mmol/L (ref 135–145)
TOTAL PROTEIN: 7.3 g/dL (ref 6.5–8.1)

## 2015-05-19 LAB — PROCALCITONIN: Procalcitonin: 0.26 ng/mL

## 2015-05-19 LAB — I-STAT CG4 LACTIC ACID, ED: Lactic Acid, Venous: 2.17 mmol/L (ref 0.5–2.0)

## 2015-05-19 LAB — POC OCCULT BLOOD, ED: Fecal Occult Bld: POSITIVE — AB

## 2015-05-19 LAB — PROTIME-INR
INR: 1.36 (ref 0.00–1.49)
PROTHROMBIN TIME: 16.9 s — AB (ref 11.6–15.2)

## 2015-05-19 LAB — APTT: aPTT: 34 seconds (ref 24–37)

## 2015-05-19 MED ORDER — SODIUM CHLORIDE 0.9 % IV BOLUS (SEPSIS)
1000.0000 mL | Freq: Once | INTRAVENOUS | Status: AC
Start: 2015-05-19 — End: 2015-05-19
  Administered 2015-05-19: 1000 mL via INTRAVENOUS

## 2015-05-19 MED ORDER — PIPERACILLIN-TAZOBACTAM 3.375 G IVPB 30 MIN: 3.3750 g | Freq: Once | INTRAVENOUS | Status: AC

## 2015-05-19 MED ORDER — VANCOMYCIN HCL IN DEXTROSE 1-5 GM/200ML-% IV SOLN: 1000.0000 mg | Freq: Once | INTRAVENOUS | Status: AC

## 2015-05-19 MED ORDER — ONDANSETRON HCL 4 MG PO TABS
4.0000 mg | ORAL_TABLET | Freq: Four times a day (QID) | ORAL | Status: DC | PRN
  Filled 2015-05-19: qty 1

## 2015-05-19 MED ORDER — ONDANSETRON HCL 4 MG/2ML IJ SOLN
4.0000 mg | Freq: Four times a day (QID) | INTRAMUSCULAR | Status: DC | PRN
  Administered 2015-05-21: 4 mg via INTRAVENOUS
  Filled 2015-05-19 (×2): qty 2

## 2015-05-19 MED ORDER — SODIUM CHLORIDE 0.9 % IV BOLUS (SEPSIS)
1000.0000 mL | INTRAVENOUS | Status: DC
  Administered 2015-05-19 (×2): 1000 mL via INTRAVENOUS

## 2015-05-19 MED ORDER — ACETAMINOPHEN 325 MG PO TABS
650.0000 mg | ORAL_TABLET | Freq: Four times a day (QID) | ORAL | Status: DC | PRN
  Filled 2015-05-19: qty 2

## 2015-05-19 MED ORDER — VANCOMYCIN HCL IN DEXTROSE 1-5 GM/200ML-% IV SOLN
1000.0000 mg | Freq: Once | INTRAVENOUS | Status: AC
  Administered 2015-05-19: 1000 mg via INTRAVENOUS
  Filled 2015-05-19: qty 200

## 2015-05-19 MED ORDER — PIPERACILLIN-TAZOBACTAM 3.375 G IVPB 30 MIN
3.3750 g | Freq: Once | INTRAVENOUS | Status: AC
  Administered 2015-05-19: 3.375 g via INTRAVENOUS
  Filled 2015-05-19: qty 50

## 2015-05-19 MED ORDER — ACETAMINOPHEN 160 MG/5ML PO SOLN
650.0000 mg | Freq: Four times a day (QID) | ORAL | Status: DC | PRN
  Administered 2015-05-19: 650 mg via ORAL
  Filled 2015-05-19: qty 20.3

## 2015-05-19 MED ORDER — RIVAROXABAN 20 MG PO TABS
20.0000 mg | ORAL_TABLET | Freq: Every day | ORAL | Status: DC
  Administered 2015-05-19 – 2015-05-23 (×5): 20 mg via ORAL
  Filled 2015-05-19 (×6): qty 1

## 2015-05-19 MED ORDER — MORPHINE SULFATE (PF) 2 MG/ML IV SOLN: 2.0000 mg | INTRAVENOUS | Status: DC | PRN

## 2015-05-19 MED ORDER — PIPERACILLIN-TAZOBACTAM 3.375 G IVPB
3.3750 g | Freq: Three times a day (TID) | INTRAVENOUS | Status: DC
  Administered 2015-05-19 – 2015-05-22 (×8): 3.375 g via INTRAVENOUS
  Filled 2015-05-19 (×8): qty 50

## 2015-05-19 MED ORDER — OXYCODONE HCL 5 MG PO TABS
5.0000 mg | ORAL_TABLET | ORAL | Status: DC | PRN
  Administered 2015-05-19 – 2015-05-24 (×5): 5 mg via ORAL
  Filled 2015-05-19 (×5): qty 1

## 2015-05-19 MED ORDER — ACETAMINOPHEN 650 MG RE SUPP: 650.0000 mg | Freq: Four times a day (QID) | RECTAL | Status: DC | PRN

## 2015-05-19 MED ORDER — SODIUM CHLORIDE 0.9 % IJ SOLN
3.0000 mL | Freq: Two times a day (BID) | INTRAMUSCULAR | Status: DC
  Administered 2015-05-20 – 2015-05-24 (×4): 3 mL via INTRAVENOUS

## 2015-05-19 MED ORDER — VANCOMYCIN HCL IN DEXTROSE 750-5 MG/150ML-% IV SOLN: 750.0000 mg | Freq: Two times a day (BID) | INTRAVENOUS | Status: DC

## 2015-05-19 MED ORDER — IOHEXOL 300 MG/ML  SOLN
100.0000 mL | Freq: Once | INTRAMUSCULAR | Status: AC | PRN
  Administered 2015-05-19: 100 mL via INTRAVENOUS

## 2015-05-19 MED ORDER — SODIUM CHLORIDE 0.9 % IV SOLN
INTRAVENOUS | Status: DC
  Administered 2015-05-19 – 2015-05-21 (×3): via INTRAVENOUS

## 2015-05-19 NOTE — H&P (Signed)
[facility] History and Physical  Howard Valentine YJE:[id number] DOB: 04-14-1956 DOA: 05/19/2015  Referring physician:  PCP: [name], MD   Chief Complaint: Sepsis  HPI: Howard Valentine is a 59 y.o. male with a past medical history of prostate cancer with pathology showing adenocarcinoma stage T2C N0 undergoing robotic-assisted laparoscopic radical prostatectomy performed on 02/08/2013. He was recently diagnosed with high grade urothelial carcinoma of bladder undergoing cystoscopy procedure performed by Dr. Louis Meckel of urology on 03/01/2015. He subsequently underwent radical cystectomy on 04/01/2015 with pathology showing infiltrative high-grade urothelial carcinoma with plasmacytoid variant, Stage T3a N1. Surgery was complicated by wound dehiscence and required repeat exploratory laparotomy. This hospitalization was complicated by the development of DVT. He was ultimately discharged from the urology service on 04/20/2015 on cephalexin 500 mg every 6 hours. He was also discharged on Xarelto. He saw Dr.Shadad in the office on 04/30/2015 at which point adjuvant chemotherapy was discussed. Today he was brought into the emergency department by EMS. He was in the car with his wife earlier in the day when he experienced several episodes of nausea and vomiting then passed out for about a minute. He denies chest pain, shortness of breath, palpitations, focal neurological deficits prior to event. His wife call 911 from the car. EMS personnel attempted to get amount of the car when he had a second syncopal event. In the emergency department initial workup revealed a white count of 23,300 with a lactate of 2.17. Vitals showed temperature 101.6 with heart rate of 122. He was started on broad-spectrum IV and microbial therapy with vancomycin and Zosyn.                                                                      Review of Systems:  Constitutional:  No weight loss, night sweats, positive for  Fevers, chills, fatigue.  HEENT:  No headaches, Difficulty swallowing,Tooth/dental problems,Sore throat,  No sneezing, itching, ear ache, nasal congestion, post nasal drip,  Cardio-vascular:  No chest pain, Orthopnea, PND, swelling in lower extremities, anasarca, dizziness, palpitations  GI:  No heartburn, indigestion, abdominal pain, nausea, vomiting, diarrhea, change in bowel habits, loss of appetite  Resp:  No shortness of breath with exertion or at rest. No excess mucus, no productive cough, No non-productive cough, No coughing up of blood.No change in color of mucus.No wheezing.No chest wall deformity  Skin:  no rash or lesions.  GU:  no dysuria, change in color of urine, no urgency or frequency. No flank pain.  Musculoskeletal:  No joint pain or swelling. No decreased range of motion. No back pain.  Psych:  No change in mood or affect. No depression or anxiety. No memory loss.   Past Medical History  Diagnosis Date  . Arthritis   . Hematuria   . Lower urinary tract symptoms (LUTS)   . Prostate cancer [facility]) urology--  dr herrick/ dr Valere Dross    DX 2014 ---  PSA 15.2,  Gleason 6/7,  vol 28cc,  s/p radical prostatectomy and external beam radiation therapy--  recurrent , PSA 0.04 (12-28-2014)  . Nocturia   . Urinary frequency    Past Surgical History  Procedure Laterality Date  . Prostate biopsy    . Robot assisted laparoscopic radical prostatectomy  N/A 02/08/2013    Procedure: ROBOTIC ASSISTED LAPAROSCOPIC RADICAL PROSTATECTOMY LEVEL 2;  Surgeon: Molli Hazard, MD;  Location: [facility];  Service: Urology;  Laterality: N/A;  ROBOTIC PROSTATECTOMY, BILATERAL PELVIC LYMPH NODE DISSECTION, RIGHT NON NERVE SPARING   . Lymphadenectomy Bilateral 02/08/2013    Procedure: LYMPHADENECTOMY;  Surgeon: Molli Hazard, MD;  Location: [facility];  Service: Urology;  Laterality: Bilateral;  . Cystoscopy with retrograde pyelogram, ureteroscopy and stent placement Bilateral 03/01/2015     Procedure: CYSTOSCOPY WITH BLADDER BIOPSY RETROGRADE BILATERAL  PYELOGRAM;  Surgeon: [name], MD;  Location: [facility];  Service: Urology;  Laterality: Bilateral;  . Cystectomy N/A 04/01/2015    Procedure: CYSTECTOMY WITH ILEAL CONDUIT ;  Surgeon: [name], MD;  Location: [facility];  Service: Urology;  Laterality: N/A;  . Irrigation and debridement abscess N/A 04/15/2015    Procedure: CLOSURE OF WOUND DEHISCENCE;  Surgeon: [name], MD;  Location: [facility];  Service: Urology;  Laterality: N/A;  . Ileo conduit     Social History:  reports that he quit smoking about 15 years ago. His smoking use included Cigarettes. He has a 20 pack-year smoking history. He has never used smokeless tobacco. He reports that he drinks about 12.6 oz of alcohol per week. He reports that he does not use illicit drugs.  No Known Allergies  Family History  Problem Relation Age of Onset  . Colon cancer      Prior to Admission medications   Medication Sig Start Date End Date Taking? Authorizing Provider  calcium carbonate (TUMS EX) 750 MG chewable tablet Chew 1 tablet by mouth daily.   Yes Historical Provider, MD  DiphenhydrAMINE HCl (ZZZQUIL) 50 MG/30ML LIQD Take 10 mLs by mouth at bedtime as needed (for sleep).   Yes Historical Provider, MD  Multiple Vitamin (MULTIVITAMIN WITH MINERALS) TABS tablet Take 1 tablet by mouth daily.   Yes Historical Provider, MD  ondansetron (ZOFRAN) 4 MG/5ML solution Take 5 mLs (4 mg total) by mouth once. Patient taking differently: Take 4 mg by mouth every 8 (eight) hours as needed for nausea or vomiting.  04/05/15  Yes [name], MD  oxyCODONE (ROXICODONE) 5 MG/5ML solution Take 5-10 mLs (5-10 mg total) by mouth every 4 (four) hours as needed for moderate pain or severe pain (While NPO use toradol first). 04/20/15  Yes [name], MD  rivaroxaban (XARELTO) 20 MG TABS tablet Take 20 mg by mouth daily.   Yes Historical Provider, MD    chlorproMAZINE (THORAZINE) 10 MG tablet Take 1 tablet (10 mg total) by mouth 4 (four) times daily as needed for hiccoughs. Patient not taking: Reported on 05/19/2015 04/19/15   [name], MD  docusate (COLACE) 50 MG/5ML liquid Take 20 mLs (200 mg total) by mouth 2 (two) times daily. Patient not taking: Reported on 05/19/2015 04/05/15   [name], MD  gabapentin (NEURONTIN) 300 MG capsule Take 1 capsule (300 mg total) by mouth 3 (three) times daily. Patient not taking: Reported on 05/19/2015 04/19/15   [name], MD  oxyCODONE (ROXICODONE) 5 MG/5ML solution Take 5-10 mLs (5-10 mg total) by mouth every 4 (four) hours as needed for moderate pain or severe pain. Patient not taking: Reported on 05/19/2015 04/05/15   [name], MD  polyethylene glycol [facility] / Floria Raveling) packet Take 17 g by mouth daily. Patient not taking: Reported on 05/19/2015 04/05/15   [name], MD  Rivaroxaban [facility] Reasons) 15  MG TABS tablet Take 1 tablet (15 mg total) by mouth 2 (two) times daily with a meal. Patient not taking: Reported on 05/19/2015 04/19/15   [name], MD   Physical Exam: Filed Vitals:   05/19/15 1703 05/19/15 1705 05/19/15 1707 05/19/15 1733  BP:    141/72  Pulse:    122  Temp:      TempSrc:      Resp:    17  Height:      Weight:  61.236 kg (135 lb)    SpO2: 93%  100% 98%    Wt Readings from Last 3 Encounters:  05/19/15 61.236 kg (135 lb)  04/30/15 59.285 kg (130 lb 11.2 oz)  04/11/15 67.132 kg (148 lb)    General:  Ill-appearing, toxic, having rigors. He was able to respond appropriately to my questions.  Eyes: PERRL, normal lids, irises & conjunctiva ENT: grossly normal hearing, lips & tongue, dry oral mucosa Neck: no LAD, masses or thyromegaly, flat neck veins Cardiovascular: Tachycardic, RRR, no m/r/g. No LE edema. Telemetry: SR, no arrhythmias, tachycardic Respiratory: He has a normal respiratory effort, I did not notes the utilization of  accessory muscles. He had good air movement bilaterally with bibasilar crackles, currently satting mid 90s on room air. Abdomen: soft, status post ileostomy, he had mild generalized tenderness to palpation over his abdomen, I do not note peritoneal signs, guarding, rebound tenderness. Skin: no rash or induration seen on limited exam Musculoskeletal: Significant muscle atrophy Psychiatric: grossly normal mood and affect, speech fluent and appropriate Neurologic: grossly non-focal.          Labs on Admission:  Basic Metabolic Panel:  Recent Labs Lab 05/19/15 1623  NA 135  K 4.4  CL 102  CO2 25  GLUCOSE 114*  BUN 9  CREATININE 0.80  CALCIUM 9.0   Liver Function Tests:  Recent Labs Lab 05/19/15 1623  AST 48*  ALT 44  ALKPHOS 779*  BILITOT 0.6  PROT 7.3  ALBUMIN 3.3*   No results for input(s): LIPASE, AMYLASE in the last 168 hours. No results for input(s): AMMONIA in the last 168 hours. CBC:  Recent Labs Lab 05/19/15 1623  WBC 23.3*  NEUTROABS 21.1*  HGB 10.3*  HCT 32.5*  MCV 87.6  PLT 440*   Cardiac Enzymes: No results for input(s): CKTOTAL, CKMB, CKMBINDEX, TROPONINI in the last 168 hours.  BNP (last 3 results) No results for input(s): BNP in the last 8760 hours.  ProBNP (last 3 results) No results for input(s): PROBNP in the last 8760 hours.  CBG:  Recent Labs Lab 05/19/15 1436  GLUCAP 103*    Radiological Exams on Admission: Dg Chest 2 View  05/19/2015  CLINICAL DATA:  Syncopal episode with weakness, nausea and cough. Prostate cancer. Initial encounter. EXAM: CHEST  2 VIEW COMPARISON:  03/12/2015. FINDINGS: The heart size and mediastinal contours are stable. There is new patchy left lower lobe airspace disease suspicious for early pneumonia or aspiration. The right lung is clear. There is no pleural effusion or pneumothorax. Mid thoracic compression deformities are stable. The bones appear mildly demineralized. IMPRESSION: New patchy left basilar  airspace disease suspicious for early pneumonia or aspiration. Electronically Signed   By: Richardean Sale M.D.   On: 05/19/2015 16:54    EKG: Independently reviewed.   Assessment/Plan Principal Problem:   Sepsis ([place]) Active Problems:   Bladder cancer ([place])   Syncope and collapse   Aspiration pneumonia (HCC)   DVT (deep venous thrombosis) ([place])  1. Sepsis. Howard Valentine is a 59 year old gentleman with a history of bladder cancer recently undergoing radical cystectomy on 04/01/2015, presenting with a temperature 101.6, heart rate of 122, white count of 23,300, lactic acid of 2.17. Source of infection could be healthcare associated pneumonia given his recent hospitalization. Other possibilities include aspiration pneumonia having vomited earlier today as he was passing out. Other possibilities include intra-abdominal infection perhaps as a complication of a recent surgery. Labs revealed an elevated alkaline phosphatase of 779. Will start him on broad-spectrum IV antibiotic therapy with vancomycin and Zosyn. He'll be further worked up with a CT scan of chest, abdomen, pelvis with IV contrast. Will place Howard Valentine on sepsis protocol, will bolus with normal saline, repeat lactate, admission to SDU. Case discussed with urology.   2. Syncope. Patient having 2 episodes of syncope earlier in the day which I think likely resulted from sepsis and profound volume depletion. Orthostatic vital signs were checked in the emergency department as his systolic blood pressure fell from 140 to 111 point from laying to standing. Providing IV fluid resuscitation with normal saline. 3. Bladder cancer. Howard Paras recently diagnosed with high-grade urothelial carcinoma Stage T3aN1 status post radical cystectomy on 04/01/2015. This was complicated by development of wound dehiscence requiring repeat exploratory laparotomy with wound repair. It is possible that sepsis may be secondary to intra-abdominal infection for which a CT scan  of abdomen and pelvis have been ordered. I spoke with Urology who will consult.  4. Right Upper extremity DVT. Patient was diagnosed with DVT on his recent hospitalization (hospitalized from 04/10/2015 through 04/20/2015). Right upper extremity ultrasound showing acute deep vein and superficial vein thrombosis involving the right subclavian vein, right axillary vein, right brachial vein and right basilic vein. He was discharged on Xarelto therapy. He had a PICC line in his right upper extremity at the time. He does not appear to have bleeding complications from anticoagulation with lab work showing improvement in his hemoglobin to 10.3, previously 8.4 on 04/20/2015. Will continue anticoagulation. 5. Severe protein calorie malnutrition. Likely as a consequence of malignancy. In the ER he has a weight of 61.2 kg. Presenting with sepsis he'll be made nothing by mouth for now. 6. DVT prophylaxis. Patient fully anticoagulated   Code Status: Full code Family Communication: I spoke to his wife who was present at bedside Disposition Plan: Will admit patient to the step down unit, anticipate he'll require greater than 2 nights hospitalization  Time spent: 70 min  Kelvin Cellar [facility] Pager [phone]

## 2015-05-19 NOTE — ED Notes (Signed)
I plan to

## 2015-05-19 NOTE — ED Notes (Signed)
Bed: PR94 Expected date:  Expected time:  Means of arrival:  Comments: EMS- 59 yo male, Syncope, CA pt

## 2015-05-19 NOTE — ED Notes (Signed)
He has just returned from CT--will tx to ICU now.

## 2015-05-19 NOTE — ED Notes (Signed)
Lactic acid 2.17, PA Tiffany notified

## 2015-05-19 NOTE — ED Provider Notes (Signed)
CSN: [id number]     Arrival date & time 05/19/15  1430 History   First MD Initiated Contact with Patient 05/19/15 1516     Chief Complaint  Patient presents with  . Loss of Consciousness     (Consider location/radiation/quality/duration/timing/severity/associated sxs/prior Treatment) HPI    The patient presents to the emergency department by EMS, he has a past medical history of prostate cancer requiring a radical prostatectomy and cystectomy due to cancer. The patient had his cystectomy on 03/01/2015 with ileostomy placed with the bag.  Per the wife she is still healing from the surgery and has been following up regularly with Dr. Louis Meckel surgeon. They are trying to get his weight up in order to be up to start chemotherapy and radiation. Currently on Xarelto after blood clot in the right shoulder developed from PICC line placement. He is scheduled to receive a full body scan per the wife this Tuesday with Dr. Osker Mason. The patient has been having loose stools that are dark brown for the past 3 weeks and reports frequent every time he eats. Today he woke up and decided he wanted to get out of the house, per the wife she was driving and he started to feel weak, had one episode of vomit that was mixed with food and green substance and then for a few minutes he had his eyes wide open but was not responding or moving for a few minutes. The wife reports she felt like he was dead, pulled over called EMS. By the time EMS arrived he had regained  Normal level of consciousness.. Per EMS he was unable to stand for orthostatics  Due to almost passing out when changing positions. He had a negative CBG and EKG were unremarkable. Patient does admit to decreased by mouth intake. Patient in room shivering because of his cold, looks cachectic, frail and pale. Has had cough for 3 weeks that is dry. Denies having any pain, headache, CP, SOB, focal weakness, change in vision.  Past Medical History  Diagnosis Date  .  Arthritis   . Hematuria   . Lower urinary tract symptoms (LUTS)   . Prostate cancer [facility]) urology--  dr herrick/ dr Valere Dross    DX 2014 ---  PSA 15.2,  Gleason 6/7,  vol 28cc,  s/p radical prostatectomy and external beam radiation therapy--  recurrent , PSA 0.04 (12-28-2014)  . Nocturia   . Urinary frequency    Past Surgical History  Procedure Laterality Date  . Prostate biopsy    . Robot assisted laparoscopic radical prostatectomy N/A 02/08/2013    Procedure: ROBOTIC ASSISTED LAPAROSCOPIC RADICAL PROSTATECTOMY LEVEL 2;  Surgeon: Molli Hazard, MD;  [facility]: [facility];  Service: Urology;  Laterality: N/A;  ROBOTIC PROSTATECTOMY, BILATERAL PELVIC LYMPH NODE DISSECTION, RIGHT NON NERVE SPARING   . Lymphadenectomy Bilateral 02/08/2013    Procedure: LYMPHADENECTOMY;  Surgeon: Molli Hazard, MD;  Location: [facility];  Service: Urology;  Laterality: Bilateral;  . Cystoscopy with retrograde pyelogram, ureteroscopy and stent placement Bilateral 03/01/2015    Procedure: CYSTOSCOPY WITH BLADDER BIOPSY RETROGRADE BILATERAL  PYELOGRAM;  Surgeon: [name], MD;  Location: [facility];  Service: Urology;  Laterality: Bilateral;  . Cystectomy N/A 04/01/2015    Procedure: CYSTECTOMY WITH ILEAL CONDUIT ;  Surgeon: [name], MD;  [facility];  Service: Urology;  Laterality: N/A;  . Irrigation and debridement abscess N/A 04/15/2015    Procedure: CLOSURE OF WOUND DEHISCENCE;  Surgeon: [name], MD;  [facility];  Service: Urology;  Laterality: N/A;  . Ileo conduit     Family History  Problem Relation Age of Onset  . Colon cancer     Social History  Substance Use Topics  . Smoking status: Former Smoker -- 1.00 packs/day for 20 years    Types: Cigarettes    Quit date: 02/25/2000  . Smokeless tobacco: Never Used  . Alcohol Use: 12.6 oz/week    21 Cans of beer per week     Comment: 3 beers daily    Review of Systems  10 Systems reviewed and  are negative for acute change except as noted in the HPI.    Allergies  Review of patient's allergies indicates no known allergies.  Home Medications   Prior to Admission medications   Medication Sig Start Date End Date Taking? Authorizing Provider  calcium carbonate (TUMS EX) 750 MG chewable tablet Chew 1 tablet by mouth daily.   Yes Historical Provider, MD  DiphenhydrAMINE HCl (ZZZQUIL) 50 MG/30ML LIQD Take 10 mLs by mouth at bedtime as needed (for sleep).   Yes Historical Provider, MD  Multiple Vitamin (MULTIVITAMIN WITH MINERALS) TABS tablet Take 1 tablet by mouth daily.   Yes Historical Provider, MD  ondansetron (ZOFRAN) 4 MG/5ML solution Take 5 mLs (4 mg total) by mouth once. Patient taking differently: Take 4 mg by mouth every 8 (eight) hours as needed for nausea or vomiting.  04/05/15  Yes [name], MD  oxyCODONE (ROXICODONE) 5 MG/5ML solution Take 5-10 mLs (5-10 mg total) by mouth every 4 (four) hours as needed for moderate pain or severe pain (While NPO use toradol first). 04/20/15  Yes [name], MD  rivaroxaban (XARELTO) 20 MG TABS tablet Take 20 mg by mouth daily.   Yes Historical Provider, MD  chlorproMAZINE (THORAZINE) 10 MG tablet Take 1 tablet (10 mg total) by mouth 4 (four) times daily as needed for hiccoughs. Patient not taking: Reported on 05/19/2015 04/19/15   [name], MD  docusate (COLACE) 50 MG/5ML liquid Take 20 mLs (200 mg total) by mouth 2 (two) times daily. Patient not taking: Reported on 05/19/2015 04/05/15   [name], MD  gabapentin (NEURONTIN) 300 MG capsule Take 1 capsule (300 mg total) by mouth 3 (three) times daily. Patient not taking: Reported on 05/19/2015 04/19/15   [name], MD  oxyCODONE (ROXICODONE) 5 MG/5ML solution Take 5-10 mLs (5-10 mg total) by mouth every 4 (four) hours as needed for moderate pain or severe pain. Patient not taking: Reported on 05/19/2015 04/05/15   [name], MD  polyethylene  glycol [facility] / Floria Raveling) packet Take 17 g by mouth daily. Patient not taking: Reported on 05/19/2015 04/05/15   [name], MD  Rivaroxaban (XARELTO) 15 MG TABS tablet Take 1 tablet (15 mg total) by mouth 2 (two) times daily with a meal. Patient not taking: Reported on 05/19/2015 04/19/15   [name], MD   BP 111/66 mmHg  Pulse 122  Temp(Src) 101.6 F (38.7 C) (Rectal)  Resp 19  Ht 5\' 6"  (1.676 m)  Wt 135 lb (61.236 kg)  BMI 21.80 kg/m2  SpO2 100% Physical Exam  Constitutional: He is oriented to person, place, and time. Vital signs are normal. He appears well-developed. He appears lethargic. He appears cachectic. He has a sickly appearance. No distress.  HENT:  Head: Normocephalic and atraumatic.  Eyes: Pupils are equal, round, and reactive to light.  Neck: Normal range of motion.  Neck supple. No spinous process tenderness and no muscular tenderness present.  Cardiovascular: Normal rate and regular rhythm.   Pulmonary/Chest: Effort normal. No accessory muscle usage. No respiratory distress. He has decreased breath sounds (diffuse decreased airway movement). He has no wheezes. He has no rhonchi. He has no rales.  Abdominal: Soft. Bowel sounds are normal. There is no tenderness. There is no rebound, no guarding and no CVA tenderness.  Ileostomy bag shows some healing around wound edges. No induration or drainage. Urine in bag is dark yellow.   Neurological: He is oriented to person, place, and time. He appears lethargic.  Skin: Skin is warm and dry. He is not diaphoretic.  Nursing note and vitals reviewed.   ED Course  Procedures (including critical care time) Labs Review Labs Reviewed  CBC WITH DIFFERENTIAL/PLATELET - Abnormal; Notable for the following:    WBC 23.3 (*)    RBC 3.71 (*)    Hemoglobin 10.3 (*)    HCT 32.5 (*)    Platelets 440 (*)    Neutro Abs 21.1 (*)    Monocytes Absolute 1.5 (*)    All other components within normal limits  COMPREHENSIVE  METABOLIC PANEL - Abnormal; Notable for the following:    Glucose, Bld 114 (*)    Albumin 3.3 (*)    AST 48 (*)    Alkaline Phosphatase 779 (*)    All other components within normal limits  CBG MONITORING, ED - Abnormal; Notable for the following:    Glucose-Capillary 103 (*)    All other components within normal limits  POC OCCULT BLOOD, ED - Abnormal; Notable for the following:    Fecal Occult Bld POSITIVE (*)    All other components within normal limits  I-STAT CG4 LACTIC ACID, ED - Abnormal; Notable for the following:    Lactic Acid, Venous 2.17 (*)    All other components within normal limits  CULTURE, BLOOD (ROUTINE X 2)  CULTURE, BLOOD (ROUTINE X 2)  URINE CULTURE  URINALYSIS, ROUTINE W REFLEX MICROSCOPIC (NOT AT [facility])    Imaging Review Dg Chest 2 View  05/19/2015  CLINICAL DATA:  Syncopal episode with weakness, nausea and cough. Prostate cancer. Initial encounter. EXAM: CHEST  2 VIEW COMPARISON:  03/12/2015. FINDINGS: The heart size and mediastinal contours are stable. There is new patchy left lower lobe airspace disease suspicious for early pneumonia or aspiration. The right lung is clear. There is no pleural effusion or pneumothorax. Mid thoracic compression deformities are stable. The bones appear mildly demineralized. IMPRESSION: New patchy left basilar airspace disease suspicious for early pneumonia or aspiration. Electronically Signed   By: Richardean Sale M.D.   On: 05/19/2015 16:54   I have personally reviewed and evaluated these images and lab results as part of my medical decision-making.   EKG Interpretation None      MDM   Final diagnoses:  SIRS (systemic inflammatory response syndrome) (HCC)  Sepsis, due to unspecified organism ([place])  Aspiration pneumonia due to vomit, unspecified laterality, unspecified part of lung ([place])    Pt developed fever, tachycardia and rigors very quickly. Began to vomit in room. Concern for sepsis.   Code Sepsis called,  protocol order set initiated. Dr. Jeanell Sparrow informed and has seen patient as well. Chest xray concerning for aspiration pneumonia. Will admit to Dr. Coralyn Pear, [facility], inpatient, step-down. Pt is full code.  Medications  sodium chloride 0.9 % bolus 1,000 mL (1,000 mLs Intravenous New Bag/Given 05/19/15 1627)  sodium chloride 0.9 % bolus  1,000 mL (1,000 mLs Intravenous New Bag/Given 05/19/15 1657)  piperacillin-tazobactam (ZOSYN) IVPB 3.375 g (3.375 g Intravenous New Bag/Given 05/19/15 1701)  vancomycin (VANCOCIN) IVPB 1000 mg/200 mL premix (1,000 mg Intravenous New Bag/Given 05/19/15 1701)  vancomycin (VANCOCIN) IVPB 750 mg/150 ml premix (not administered)  piperacillin-tazobactam (ZOSYN) IVPB 3.375 g (not administered)       Delos Haring, PA-C 05/19/15 1712  Pattricia Boss, MD 05/19/15 2130

## 2015-05-19 NOTE — ED Notes (Signed)
He states that as his wife was driving them to shop; he felt weak and nauseated and vomited x 1 and "passed out".  He states he has had extensive urologic procedures d/t cancer this month and arrives with illeotomy with bag intact which is draining dark amber urine.  He is awake, alert and in no distress.  His ecg and cbg were benign en route to hospital.  His skin is pale, cool and sry and he is breathing normally.  EKG performed upon arrival.

## 2015-05-19 NOTE — Progress Notes (Signed)
ANTIBIOTIC CONSULT NOTE - INITIAL  Pharmacy Consult for Vancomycin, Zosyn Indication: rule out sepsis  No Known Allergies  Patient Measurements: Height: 5\' 6"  (167.6 cm) Weight: 130 lb (58.968 kg) IBW/kg (Calculated) : 63.8 Adjusted Body Weight:   Vital Signs: Temp: 101.6 F (38.7 C) (10/30 1634) Temp Source: Rectal (10/30 1634) BP: 114/64 mmHg (10/30 1433) Pulse Rate: 91 (10/30 1433) Intake/Output from previous day:   Intake/Output from this shift:    Labs:  Recent Labs  05/19/15 1623  WBC 23.3*  HGB 10.3*  PLT 440*   CrCl cannot be calculated (Patient has no serum creatinine result on file.). No results for input(s): VANCOTROUGH, VANCOPEAK, VANCORANDOM, GENTTROUGH, GENTPEAK, GENTRANDOM, TOBRATROUGH, TOBRAPEAK, TOBRARND, AMIKACINPEAK, AMIKACINTROU, AMIKACIN in the last 72 hours.   Microbiology: No results found for this or any previous visit (from the past 720 hour(s)).  Medical History: Past Medical History  Diagnosis Date  . Arthritis   . Hematuria   . Lower urinary tract symptoms (LUTS)   . Prostate cancer [facility]) urology--  dr herrick/ dr Valere Dross    DX 2014 ---  PSA 15.2,  Gleason 6/7,  vol 28cc,  s/p radical prostatectomy and external beam radiation therapy--  recurrent , PSA 0.04 (12-28-2014)  . Nocturia   . Urinary frequency      Assessment: 50 yoM with bladder cancer s/p radical cystectomy on 9/32/671 with complication with wound dehiscence which required repeat ex lap, closure of fascial dehiscence and colostomy revision. Hospitalization also complicated by DVT associated with PICC line and pt treated with Xarelto and was discharged on 10/1.  Presents today after episode of weakness, emesis, loss of consciousness witnessed by wife.  Ileostomy bag draining dark amber urine.  Has not started chemo yet.  Pt cachectic, frail and pale per ED notes. Temp 101.6, WBC 23.3, LA 2.17.  Vanc 1g and Zosyn 3.375 ordered STAT in ED for sepsis.  Pharmacy consulted to  further dosing of vanc/zosyn.  10/30 >> Vanc >> 10/30 >> Zosyn >>  Renal: SCr 0.80, CrCl~81 ml/min 10/30 blood and urine cultures collected  Goal of Therapy:  Vancomycin trough level 15-20 mcg/ml  Doses adjusted per renal function Eradication of infection  Plan:  1.  Vancomycin 750 mg IV q12h after 1g dose given in ED. 2.  Zosyn 3.375g IV q8h (4 hour infusion time).  3.  F/u SCr, culture results, trough levels as indicated.  Hershal Coria 05/19/2015,4:48 PM

## 2015-05-19 NOTE — ED Notes (Signed)
As I write this, we are obtaining blood cultures, and Dr. Jeanell Sparrow is examining him.  Pt. Remains in no distress--he had vomited x 1.  His wife remains with him--his only c/o at present is of "chills".  Will give ATBX as soon as blood cx obtained.

## 2015-05-20 ENCOUNTER — Inpatient Hospital Stay (HOSPITAL_COMMUNITY): Payer: BLUE CROSS/BLUE SHIELD

## 2015-05-20 DIAGNOSIS — A419 Sepsis, unspecified organism: Secondary | ICD-10-CM

## 2015-05-20 DIAGNOSIS — J189 Pneumonia, unspecified organism: Secondary | ICD-10-CM

## 2015-05-20 DIAGNOSIS — R7881 Bacteremia: Secondary | ICD-10-CM

## 2015-05-20 DIAGNOSIS — N390 Urinary tract infection, site not specified: Secondary | ICD-10-CM

## 2015-05-20 DIAGNOSIS — Z436 Encounter for attention to other artificial openings of urinary tract: Secondary | ICD-10-CM

## 2015-05-20 DIAGNOSIS — E43 Unspecified severe protein-calorie malnutrition: Secondary | ICD-10-CM | POA: Insufficient documentation

## 2015-05-20 DIAGNOSIS — D72829 Elevated white blood cell count, unspecified: Secondary | ICD-10-CM

## 2015-05-20 LAB — COMPREHENSIVE METABOLIC PANEL
ALBUMIN: 2.6 g/dL — AB (ref 3.5–5.0)
ALK PHOS: 613 U/L — AB (ref 38–126)
ALT: 38 U/L (ref 17–63)
AST: 44 U/L — AB (ref 15–41)
Anion gap: 9 (ref 5–15)
BUN: 7 mg/dL (ref 6–20)
CALCIUM: 8.3 mg/dL — AB (ref 8.9–10.3)
CHLORIDE: 103 mmol/L (ref 101–111)
CO2: 23 mmol/L (ref 22–32)
CREATININE: 0.86 mg/dL (ref 0.61–1.24)
GFR calc non Af Amer: >60 mL/min (ref >60)
GLUCOSE: 108 mg/dL — AB (ref 65–99)
Potassium: 4.2 mmol/L (ref 3.5–5.1)
SODIUM: 135 mmol/L (ref 135–145)
Total Bilirubin: 0.8 mg/dL (ref 0.3–1.2)
Total Protein: 6.8 g/dL (ref 6.5–8.1)

## 2015-05-20 LAB — CBC
HCT: 28.5 % — ABNORMAL LOW (ref 39.0–52.0)
Hemoglobin: 8.6 g/dL — ABNORMAL LOW (ref 13.0–17.0)
MCH: 26.7 pg (ref 26.0–34.0)
MCHC: 30.2 g/dL (ref 30.0–36.0)
MCV: 88.5 fL (ref 78.0–100.0)
PLATELETS: 409 10*3/uL — AB (ref 150–400)
RBC: 3.22 MIL/uL — ABNORMAL LOW (ref 4.22–5.81)
RDW: 15.6 % — AB (ref 11.5–15.5)
WBC: 27.7 10*3/uL — ABNORMAL HIGH (ref 4.0–10.5)

## 2015-05-20 LAB — MRSA PCR SCREENING: MRSA BY PCR: NEGATIVE

## 2015-05-20 MED ORDER — ACETAMINOPHEN 160 MG/5ML PO SOLN
650.0000 mg | Freq: Four times a day (QID) | ORAL | Status: DC | PRN
  Administered 2015-05-20 – 2015-05-22 (×4): 650 mg via ORAL
  Filled 2015-05-20 (×4): qty 20.3

## 2015-05-20 MED ORDER — CYCLOBENZAPRINE HCL 5 MG PO TABS: 5.0000 mg | ORAL_TABLET | Freq: Three times a day (TID) | ORAL | Status: DC | PRN

## 2015-05-20 MED ORDER — SENNOSIDES-DOCUSATE SODIUM 8.6-50 MG PO TABS
1.0000 | ORAL_TABLET | Freq: Two times a day (BID) | ORAL | Status: DC
  Administered 2015-05-21: 1 via ORAL
  Filled 2015-05-20 (×2): qty 1

## 2015-05-20 MED ORDER — VANCOMYCIN HCL IN DEXTROSE 750-5 MG/150ML-% IV SOLN
750.0000 mg | Freq: Two times a day (BID) | INTRAVENOUS | Status: DC
  Administered 2015-05-20 (×2): 750 mg via INTRAVENOUS
  Filled 2015-05-20 (×3): qty 150

## 2015-05-20 MED ORDER — CETYLPYRIDINIUM CHLORIDE 0.05 % MT LIQD
7.0000 mL | Freq: Two times a day (BID) | OROMUCOSAL | Status: DC
  Administered 2015-05-20 – 2015-05-21 (×2): 7 mL via OROMUCOSAL

## 2015-05-20 NOTE — Progress Notes (Signed)
Dr. Shaune Leeks notified of gram negative rods in blood cultures.

## 2015-05-20 NOTE — Progress Notes (Signed)
PROGRESS NOTE  Howard Valentine GEX:[id number] DOB: [birth year] DOA: 05/19/2015 PCP: [name], MD  HPI/Recap of past 24 hours:  no cough, no sob, on room air. Reported frontal headache, and feeling tired.  Assessment/Plan: Principal Problem:   Sepsis ([place]) Active Problems:   Bladder cancer (HCC)   Syncope and collapse   Aspiration pneumonia (HCC)   DVT (deep venous thrombosis) (HCC)  Sepsis with leukocytosis, fever, sinus tachycardia, lactic acidosis on presentation, on ivf/abx  UTI/left lower lobe pna: on vanc/zosyn to cover HCAP, swallow eval to r/o aspiration, (reported chronic history of difficulty swallowing pills)  G- bacteremia: from uti? On zosyn. Will need to repeat blood culture  In a few days to ensure clearance  Syncope: likely from sepsis. Will check echocardiogram.  H/o picc line associated right upper extremity DVT on xarelto.  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 S/p urostomy, ostomy RN and urology consulted. Outpatient oncology follow up to discuss adjuvant chemo.  H/o prostate cancer s/p robotic assisted laparoscopic radical prostatectomy in 01/2013.  Severe protein calorie malnutrition: from malignancy. Nutrition consult obtained.     Code Status: full  Family Communication: patient   Disposition Plan: remain in stepdown   Consultants:  urology  Procedures:  none  Antibiotics:  vanc/zosyn   Objective: BP 94/56 mmHg  Pulse 99  Temp(Src) 99 F (37.2 C) (Oral)  Resp 13  Ht 5\' 3"  (1.6 m)  Wt 135 lb (61.236 kg)  BMI 23.92 kg/m2  SpO2 99%  Intake/Output Summary (Last 24 hours) at 05/20/15 0900 Last data filed at 05/20/15 0820  Gross per 24 hour  Intake 2543.75 ml  Output   1000 ml  Net 1543.75 ml   Filed Weights   05/19/15 1642 05/19/15 1705  Weight: 130 lb (58.968 kg) 135 lb (61.236 kg)    Exam:   General:   NAD, frail  Cardiovascular: RRR  Respiratory: CTABL  Abdomen: Soft/ND/NT, positive BS, urostomy bag with clear urine, no visible blood.  Musculoskeletal: No Edema  Neuro: aaox3  Data Reviewed: Basic Metabolic Panel:  Recent Labs Lab 05/19/15 1623 05/20/15 0410  NA 135 135  K 4.4 4.2  CL 102 103  CO2 25 23  GLUCOSE 114* 108*  BUN 9 7  CREATININE 0.80 0.86  CALCIUM 9.0 8.3*   Liver Function Tests:  Recent Labs Lab 05/19/15 1623 05/20/15 0410  AST 48* 44*  ALT 44 38  ALKPHOS 779* 613*  BILITOT 0.6 0.8  PROT 7.3 6.8  ALBUMIN 3.3* 2.6*   No results for input(s): LIPASE, AMYLASE in the last 168 hours. No results for input(s): AMMONIA in the last 168 hours. CBC:  Recent Labs Lab 05/19/15 1623 05/20/15 0410  WBC 23.3* 27.7*  NEUTROABS 21.1*  --   HGB 10.3* 8.6*  HCT 32.5* 28.5*  MCV 87.6 88.5  PLT 440* 409*   Cardiac Enzymes:   No results for input(s): CKTOTAL, CKMB, CKMBINDEX, TROPONINI in the last 168 hours. BNP (last 3 results) No results for input(s): BNP in the last 8760 hours.  ProBNP (last 3 results) No results for input(s): PROBNP in the last 8760 hours.  CBG:  Recent Labs Lab 05/19/15 1436  GLUCAP 103*    Recent Results (from the past 240 hour(s))  Culture, blood (routine x 2)     Status: None (Preliminary result)   Collection Time: 05/19/15  5:00 PM  Result Value Ref Range Status  Specimen Description BLOOD LEFT HAND  Final   Special Requests BOTTLES DRAWN AEROBIC ONLY 5CC  Final   Culture  Setup Time   Final    GRAM NEGATIVE RODS AEROBIC BOTTLE ONLY CRITICAL RESULT CALLED TO, READ BACK BY AND VERIFIED WITH: C. HOEFLER,RN AT [id number] ON [id number] BY Rhea Bleacher    Culture   Final    NO GROWTH < 24 HOURS Performed at [facility]    Report Status PENDING  Incomplete  Culture, blood (routine x 2)     Status: None (Preliminary result)   Collection Time: 05/19/15  5:02 PM  Result Value Ref Range Status   Specimen Description  BLOOD BLOOD LEFT FOREARM  Final   Special Requests BOTTLES DRAWN AEROBIC AND ANAEROBIC 5 CC  Final   Culture  Setup Time   Final    GRAM NEGATIVE RODS IN BOTH AEROBIC AND ANAEROBIC BOTTLES CRITICAL RESULT CALLED TO, READ BACK BY AND VERIFIED WITH: C. HOEFLER,RN AT [id number] ON [id number] BY Rhea Bleacher Performed at [facility]    Culture PENDING  Incomplete   Report Status PENDING  Incomplete  MRSA PCR Screening     Status: None   Collection Time: 05/19/15  8:00 PM  Result Value Ref Range Status   MRSA by PCR NEGATIVE NEGATIVE Final    Comment:        The GeneXpert MRSA Assay (FDA approved for NASAL specimens only), is one component of a comprehensive MRSA colonization surveillance program. It is not intended to diagnose MRSA infection nor to guide or monitor treatment for MRSA infections.      Studies: Dg Chest 2 View  05/19/2015  CLINICAL DATA:  Syncopal episode with weakness, nausea and cough. Prostate cancer. Initial encounter. EXAM: CHEST  2 VIEW COMPARISON:  03/12/2015. FINDINGS: The heart size and mediastinal contours are stable. There is new patchy left lower lobe airspace disease suspicious for early pneumonia or aspiration. The right lung is clear. There is no pleural effusion or pneumothorax. Mid thoracic compression deformities are stable. The bones appear mildly demineralized. IMPRESSION: New patchy left basilar airspace disease suspicious for early pneumonia or aspiration. Electronically Signed   By: Richardean Sale M.D.   On: 05/19/2015 16:54   Ct Chest W Contrast  05/19/2015  CLINICAL DATA:  Possible sepsis. Radical cystectomy for urothelial carcinoma 04/01/2015. History of prostate cancer and prostatectomy. EXAM: CT CHEST, ABDOMEN, AND PELVIS WITH CONTRAST TECHNIQUE: Multidetector CT imaging of the chest, abdomen and pelvis was performed following the standard protocol during bolus administration of intravenous contrast. CONTRAST:  194mL OMNIPAQUE IOHEXOL 300 MG/ML   SOLN COMPARISON:  Abdominal pelvic CT 04/11/2015. FINDINGS: Mediastinum/Nodes: There are no enlarged mediastinal, hilar or axillary lymph nodes. The thyroid gland, trachea and esophagus demonstrate no significant findings. The heart size is normal. There is no pericardial effusion. There are no significant vascular findings. Lungs/Pleura: There is no pleural effusion.As demonstrated on earlier radiographs, there is patchy airspace disease at the left lung base suspicious for pneumonia or aspiration. There is minimal patchy airspace disease in the right lower lobe. At the right lung apex, there is a 4 mm nodule on image 8. No other pulmonary nodules are demonstrated. Musculoskeletal/Chest wall: No chest wall mass or suspicious osseous findings. There are several lower thoracic compression deformities which appear chronic and unchanged. CT ABDOMEN AND PELVIS FINDINGS Hepatobiliary: There is new periportal edema throughout the liver. The gallbladder is distended without wall thickening or calcified stones. There is no biliary dilatation.  No suspicious liver lesions are identified. There is a questionable small low-density subcapsular lesion right lobe measuring 6 mm on image 60, not clearly seen previously. Pancreas: Unremarkable. No pancreatic ductal dilatation or surrounding inflammatory changes. Spleen: Normal in size without focal abnormality. Adrenals/Urinary Tract: Both adrenal glands appear normal. The ureteral stents have been removed. Both kidneys demonstrate collecting system dilatation without evidence of ureteral obstruction on delayed images. There is no evidence of renal mass. There is perinephric soft tissue stranding bilaterally. No obstructing calculi are seen. There are postsurgical changes in pelvis consistent with cystectomy and urinary diversion. Delayed images through the ileal conduit were not obtained. Stomach/Bowel: There is progressive wall thickening of the distal stomach and duodenum. There  is also wall thickening of the ascending and transverse colon. There are increased inflammatory changes throughout the upper mesentery and retroperitoneum. No focal extraluminal fluid collection identified. A small amount of gas superior to the transverse duodenum on image 69 corresponds with diverticulum on the prior study. There is no drainable extraluminal fluid collection. There is no significant residual bowel distension. Vascular/Lymphatic: There are no enlarged abdominal or pelvic lymph nodes. There is mild aortoiliac atherosclerosis. Reproductive: Status post prostatectomy. A small amount of free pelvic fluid is present. Other: There are postsurgical changes within the anterior abdominal wall. There is a small fluid collection within the midline incision. There is some edema in the suprapubic region. Musculoskeletal: No acute or significant osseous findings. No evidence of osseous metastatic disease. IMPRESSION: 1. CT confirms the presence of new left lower lobe airspace disease suspicious for pneumonia or aspiration. There is also minimal right lower lobe involvement. 2. New periportal edema in the liver with gallbladder distention, but no biliary dilatation or gallbladder wall thickening. 3. Increased inflammatory changes in the upper abdomen with mesenteric and retroperitoneal edema, wall thickening of the distal stomach, duodenum and transverse colon. A unifying explanation for these findings is not obvious. No significant residual bowel obstruction or drainable fluid collection demonstrated. 4. Interval ureteral stent removal without evidence of ureteral obstruction. 5. No evidence of metastatic disease. Electronically Signed   By: Richardean Sale M.D.   On: 05/19/2015 19:29   Ct Abdomen Pelvis W Contrast  05/19/2015  CLINICAL DATA:  Possible sepsis. Radical cystectomy for urothelial carcinoma 04/01/2015. History of prostate cancer and prostatectomy. EXAM: CT CHEST, ABDOMEN, AND PELVIS WITH CONTRAST  TECHNIQUE: Multidetector CT imaging of the chest, abdomen and pelvis was performed following the standard protocol during bolus administration of intravenous contrast. CONTRAST:  136mL OMNIPAQUE IOHEXOL 300 MG/ML  SOLN COMPARISON:  Abdominal pelvic CT 04/11/2015. FINDINGS: Mediastinum/Nodes: There are no enlarged mediastinal, hilar or axillary lymph nodes. The thyroid gland, trachea and esophagus demonstrate no significant findings. The heart size is normal. There is no pericardial effusion. There are no significant vascular findings. Lungs/Pleura: There is no pleural effusion.As demonstrated on earlier radiographs, there is patchy airspace disease at the left lung base suspicious for pneumonia or aspiration. There is minimal patchy airspace disease in the right lower lobe. At the right lung apex, there is a 4 mm nodule on image 8. No other pulmonary nodules are demonstrated. Musculoskeletal/Chest wall: No chest wall mass or suspicious osseous findings. There are several lower thoracic compression deformities which appear chronic and unchanged. CT ABDOMEN AND PELVIS FINDINGS Hepatobiliary: There is new periportal edema throughout the liver. The gallbladder is distended without wall thickening or calcified stones. There is no biliary dilatation. No suspicious liver lesions are identified. There is a questionable small low-density  subcapsular lesion right lobe measuring 6 mm on image 60, not clearly seen previously. Pancreas: Unremarkable. No pancreatic ductal dilatation or surrounding inflammatory changes. Spleen: Normal in size without focal abnormality. Adrenals/Urinary Tract: Both adrenal glands appear normal. The ureteral stents have been removed. Both kidneys demonstrate collecting system dilatation without evidence of ureteral obstruction on delayed images. There is no evidence of renal mass. There is perinephric soft tissue stranding bilaterally. No obstructing calculi are seen. There are postsurgical changes  in pelvis consistent with cystectomy and urinary diversion. Delayed images through the ileal conduit were not obtained. Stomach/Bowel: There is progressive wall thickening of the distal stomach and duodenum. There is also wall thickening of the ascending and transverse colon. There are increased inflammatory changes throughout the upper mesentery and retroperitoneum. No focal extraluminal fluid collection identified. A small amount of gas superior to the transverse duodenum on image 69 corresponds with diverticulum on the prior study. There is no drainable extraluminal fluid collection. There is no significant residual bowel distension. Vascular/Lymphatic: There are no enlarged abdominal or pelvic lymph nodes. There is mild aortoiliac atherosclerosis. Reproductive: Status post prostatectomy. A small amount of free pelvic fluid is present. Other: There are postsurgical changes within the anterior abdominal wall. There is a small fluid collection within the midline incision. There is some edema in the suprapubic region. Musculoskeletal: No acute or significant osseous findings. No evidence of osseous metastatic disease. IMPRESSION: 1. CT confirms the presence of new left lower lobe airspace disease suspicious for pneumonia or aspiration. There is also minimal right lower lobe involvement. 2. New periportal edema in the liver with gallbladder distention, but no biliary dilatation or gallbladder wall thickening. 3. Increased inflammatory changes in the upper abdomen with mesenteric and retroperitoneal edema, wall thickening of the distal stomach, duodenum and transverse colon. A unifying explanation for these findings is not obvious. No significant residual bowel obstruction or drainable fluid collection demonstrated. 4. Interval ureteral stent removal without evidence of ureteral obstruction. 5. No evidence of metastatic disease. Electronically Signed   By: Richardean Sale M.D.   On: 05/19/2015 19:29    Scheduled  Meds: . antiseptic oral rinse  7 mL Mouth Rinse BID  . piperacillin-tazobactam (ZOSYN)  IV  3.375 g Intravenous 3 times per day  . rivaroxaban  20 mg Oral Q supper  . sodium chloride  3 mL Intravenous Q12H    Continuous Infusions: . sodium chloride 125 mL/hr at 05/19/15 2027     Time spent: 21mins  Rikita Grabert MD, PhD  [facility] Pager [phone]. If 7PM-7AM, please contact night-coverage at [email or web address], password [email or web address] 05/20/2015, 9:00 AM  LOS: 1 day

## 2015-05-20 NOTE — Progress Notes (Signed)
Initial Nutrition Assessment  DOCUMENTATION CODES:   Severe malnutrition in context of acute illness/injury  INTERVENTION:  - Will order [employer] Instant Breakfast BID, each supplement provides 240 kcal and 10 grams of protein - Encourage intakes of meals and supplements - RD will continue to monitor for needs  NUTRITION DIAGNOSIS:   Increased nutrient needs related to catabolic illness, cancer and cancer related treatments as evidenced by estimated needs.  GOAL:   Patient will meet greater than or equal to 90% of their needs  MONITOR:   PO intake, Supplement acceptance, Weight trends, Labs, Skin, I & O's  REASON FOR ASSESSMENT:   Malnutrition Screening Tool  ASSESSMENT:   59 y.o. male with a past medical history of prostate cancer with pathology showing adenocarcinoma stage T2C N0 undergoing robotic-assisted laparoscopic radical prostatectomy performed on 02/08/2013. He was recently diagnosed with high grade urothelial carcinoma of bladder undergoing cystoscopy procedure performed by Dr. Louis Meckel of urology on 03/01/2015. He subsequently underwent radical cystectomy on 04/01/2015 with pathology showing infiltrative high-grade urothelial carcinoma with plasmacytoid variant, Stage T3a N1. Surgery was complicated by wound dehiscence and required repeat exploratory laparotomy.  Pt seen for MST. BMI indicates normal weight status. Pt states he had white toast and ginger ale for breakfast this AM due to lack of appetite and feeling slightly nauseated. He feels that ginger ale helped to settle his stomach.   He states that for the past 1-2 weeks he has had diarrhea with all PO intakes, including fluids. He typically needs to use the bathroom ~5 minutes after intakes. He typically did not experience nausea with this.   He states that he went to a doctor's appointment 2 weeks ago at which time he weighed 138 lbs. This indicates 3 lbs weight loss (2% body weight) in 2 weeks which is not  significant for time frame. Per chart review, current weight is consistent with weight in August 2016 but in September 2016 pt weighed 148 lbs; will need to monitor weight trends.  Moderate muscle and fat wasting noted during physical assessment. No edema.   Pt was drinking [employer] x1/day PTA and liked this item. He has tried Ensure and Boost in the past but did not like the flavor of these items. He is interested in receiving [employer] during hospitalization and is open to receiving it BID to increase kcal and protein in his diet.   Unsure if pt was fully meeting needs PTA. Medications reviewed. Labs reviewed; Ca: 8.3 mg/dL, Alk Phos/AST elevated.   Diet Order:  Diet Heart Room service appropriate?: Yes; Fluid consistency:: Thin  Skin:   Mid abdominal surgical incision   Last BM:  10/30  Height:   Ht Readings from Last 1 Encounters:  05/19/15 5' 3"  (1.6 m)    Weight:   Wt Readings from Last 1 Encounters:  05/19/15 135 lb (61.236 kg)    Ideal Body Weight:  56.36 kg (kg)  BMI:  Body mass index is 23.92 kg/(m^2).  Estimated Nutritional Needs:   Kcal:  1850-2150  Protein:  73-85 grams  Fluid:  2 L/day  EDUCATION NEEDS:   No education needs identified at this time     Jarome Matin, RD, LDN Inpatient Clinical Dietitian Pager # [phone] After hours/weekend pager # [phone]

## 2015-05-20 NOTE — Consult Note (Signed)
I have been asked to see the patient by Dr. Florencia Reasons for evaluation and management of SIRS.  History of present illness: 59 year old male, well-known to me, with advanced transitional cell carcinoma status post radical cystectomy with ileal conduit March 01, 2015.  The patient's recovery has been slow.  I have been following him on a weekly basis for small wound infection which has been healing by secondary intent and has been making good progress.  He also has a right-sided upper extremity DVT for which he is treated with Xarelto  The patient has been slowly recovering from his surgery, but still remained listless with low energy levels.  He told me this morning that he has had a chronic right low back pain.  He is also had a dry cough for the past several weeks.  In addition, he relates frequent loose stools immediately following a meal.  He has not been febrile.  He has not been lightheaded or dizzy.  Yesterday in an attempt to get out of the house, was riding with his wife when he developed sudden episode of emesis and syncope.   EMS was called at which point he was noted to be severely orthostatic.    In the emergency department he was febrile to 101.6.  He was also noted to have an elevated lactate and tachycardia.  His white blood cell count was noted to be 23,000.  His blood cultures have just recently returned demonstrating gram-negative rods.  He otherwise has no real symptoms or complaints.    Review of systems: A 12 point comprehensive review of systems was obtained and is negative unless otherwise stated in the history of present illness.  Patient Active Problem List   Diagnosis Date Noted  . Syncope and collapse 05/19/2015  . Sepsis ([place]) 05/19/2015  . Aspiration pneumonia ([place]) 05/19/2015  . DVT (deep venous thrombosis) ([place]) 05/19/2015  . Pressure ulcer 04/11/2015  . Elevated white blood cell count 04/10/2015  . Bladder cancer ([place]) 04/01/2015  . Malignant neoplasm of prostate  ([place]) 02/05/2015    No current facility-administered medications on file prior to encounter.   Current Outpatient Prescriptions on File Prior to Encounter  Medication Sig Dispense Refill  . calcium carbonate (TUMS EX) 750 MG chewable tablet Chew 1 tablet by mouth daily.    . ondansetron (ZOFRAN) 4 MG/5ML solution Take 5 mLs (4 mg total) by mouth once. (Patient taking differently: Take 4 mg by mouth every 8 (eight) hours as needed for nausea or vomiting. ) 50 mL 0  . oxyCODONE (ROXICODONE) 5 MG/5ML solution Take 5-10 mLs (5-10 mg total) by mouth every 4 (four) hours as needed for moderate pain or severe pain (While NPO use toradol first). 250 mL 0  . chlorproMAZINE (THORAZINE) 10 MG tablet Take 1 tablet (10 mg total) by mouth 4 (four) times daily as needed for hiccoughs. (Patient not taking: Reported on 05/19/2015) 30 tablet 1  . docusate (COLACE) 50 MG/5ML liquid Take 20 mLs (200 mg total) by mouth 2 (two) times daily. (Patient not taking: Reported on 05/19/2015) 473 mL 0  . gabapentin (NEURONTIN) 300 MG capsule Take 1 capsule (300 mg total) by mouth 3 (three) times daily. (Patient not taking: Reported on 05/19/2015) 60 capsule 0  . oxyCODONE (ROXICODONE) 5 MG/5ML solution Take 5-10 mLs (5-10 mg total) by mouth every 4 (four) hours as needed for moderate pain or severe pain. (Patient not taking: Reported on 05/19/2015) 473 mL 0  . polyethylene glycol (MIRALAX / GLYCOLAX)  packet Take 17 g by mouth daily. (Patient not taking: Reported on 05/19/2015) 30 each 0  . Rivaroxaban (XARELTO) 15 MG TABS tablet Take 1 tablet (15 mg total) by mouth 2 (two) times daily with a meal. (Patient not taking: Reported on 05/19/2015) 42 tablet 0    Past Medical History  Diagnosis Date  . Arthritis   . Hematuria   . Lower urinary tract symptoms (LUTS)   . Prostate cancer [facility]) urology--  dr Teasha Murrillo/ dr Valere Dross    DX 2014 ---  PSA 15.2,  Gleason 6/7,  vol 28cc,  s/p radical prostatectomy and external beam radiation  therapy--  recurrent , PSA 0.04 (12-28-2014)  . Nocturia   . Urinary frequency     Past Surgical History  Procedure Laterality Date  . Prostate biopsy    . Robot assisted laparoscopic radical prostatectomy N/A 02/08/2013    Procedure: ROBOTIC ASSISTED LAPAROSCOPIC RADICAL PROSTATECTOMY LEVEL 2;  Surgeon: Molli Hazard, MD;  Location: [facility];  Service: Urology;  Laterality: N/A;  ROBOTIC PROSTATECTOMY, BILATERAL PELVIC LYMPH NODE DISSECTION, RIGHT NON NERVE SPARING   . Lymphadenectomy Bilateral 02/08/2013    Procedure: LYMPHADENECTOMY;  Surgeon: Molli Hazard, MD;  Location: [facility];  Service: Urology;  Laterality: Bilateral;  . Cystoscopy with retrograde pyelogram, ureteroscopy and stent placement Bilateral 03/01/2015    Procedure: CYSTOSCOPY WITH BLADDER BIOPSY RETROGRADE BILATERAL  PYELOGRAM;  Surgeon: [name], MD;  Location: [facility];  Service: Urology;  Laterality: Bilateral;  . Cystectomy N/A 04/01/2015    Procedure: CYSTECTOMY WITH ILEAL CONDUIT ;  Surgeon: [name], MD;  Location: [facility];  Service: Urology;  Laterality: N/A;  . Irrigation and debridement abscess N/A 04/15/2015    Procedure: CLOSURE OF WOUND DEHISCENCE;  Surgeon: [name], MD;  Location: [facility];  Service: Urology;  Laterality: N/A;  . Ileo conduit      Social History  Substance Use Topics  . Smoking status: Former Smoker -- 1.00 packs/day for 20 years    Types: Cigarettes    Quit date: 02/25/2000  . Smokeless tobacco: Never Used  . Alcohol Use: 12.6 oz/week    21 Cans of beer per week     Comment: 3 beers daily    Family History  Problem Relation Age of Onset  . Colon cancer      PE: Filed Vitals:   05/20/15 0400 05/20/15 0500 05/20/15 0600 05/20/15 0800  BP: 118/65  98/56   Pulse: 109 109 109   Temp:    99 F (37.2 C)  TempSrc:    Oral  Resp: 14 17 17    Height:      Weight:      SpO2: 100% 100% 99%    Patient appears to be in no acute  distress - he is thin appearing patient is alert and oriented x3 Atraumatic normocephalic head No cervical or supraclavicular lymphadenopathy appreciated No increased work of breathing, no audible wheezes/rhonchi Regular sinus rhythm/rate - 99 Abdomen is soft, nontender, nondistended, + Right CVA tenderness Midline wound is granulating and filling in nicely.  It is approximately a quarter in size and roughly 5 mm deep.  There is some erythema inferior to this, this is new. The patient's stoma is pink and viable.  There is clear yellow urine in the bag Lower extremities are symmetric without appreciable edema Grossly neurologically intact No identifiable skin lesions   Recent Labs  05/19/15 1623 05/20/15 0410  WBC 23.3* 27.7*  HGB  10.3* 8.6*  HCT 32.5* 28.5*    Recent Labs  05/19/15 1623 05/20/15 0410  NA 135 135  K 4.4 4.2  CL 102 103  CO2 25 23  GLUCOSE 114* 108*  BUN 9 7  CREATININE 0.80 0.86  CALCIUM 9.0 8.3*    Recent Labs  05/19/15 1623  INR 1.36   No results for input(s): LABURIN in the last 72 hours. Results for orders placed or performed during the hospital encounter of 05/19/15  Culture, blood (routine x 2)     Status: None (Preliminary result)   Collection Time: 05/19/15  5:00 PM  Result Value Ref Range Status   Specimen Description BLOOD LEFT HAND  Final   Special Requests BOTTLES DRAWN AEROBIC ONLY 5CC  Final   Culture  Setup Time   Final    GRAM NEGATIVE RODS AEROBIC BOTTLE ONLY CRITICAL RESULT CALLED TO, READ BACK BY AND VERIFIED WITH: C. HOEFLER,RN AT [place] ON [id number] BY Rhea Bleacher Performed at [facility]    Culture PENDING  Incomplete   Report Status PENDING  Incomplete  Culture, blood (routine x 2)     Status: None (Preliminary result)   Collection Time: 05/19/15  5:02 PM  Result Value Ref Range Status   Specimen Description BLOOD BLOOD LEFT FOREARM  Final   Special Requests BOTTLES DRAWN AEROBIC AND ANAEROBIC 5 CC  Final    Culture  Setup Time   Final    GRAM NEGATIVE RODS AEROBIC BOTTLE ONLY CRITICAL RESULT CALLED TO, READ BACK BY AND VERIFIED WITH: C. HOEFLER,RN AT [id number] ON [id number] BY Rhea Bleacher Performed at [facility]    Culture PENDING  Incomplete   Report Status PENDING  Incomplete  MRSA PCR Screening     Status: None   Collection Time: 05/19/15  8:00 PM  Result Value Ref Range Status   MRSA by PCR NEGATIVE NEGATIVE Final    Comment:        The GeneXpert MRSA Assay (FDA approved for NASAL specimens only), is one component of a comprehensive MRSA colonization surveillance program. It is not intended to diagnose MRSA infection nor to guide or monitor treatment for MRSA infections.     Imaging: The patient's CT scan of the chest demonstrates diffuse left lower lobe airspace disease.  There is also a 4 mm nodule in the right apex.   CT abdomen/pelvis demonstrates some wall thickening of the upper GI tract as well as ascending and transverse colon and increased inflammatory changes throughout the upper mesentery and retroperitoneum.  There is no evidence of bowel obstruction.  There is stranding around the kidneys, minimally dilated consistent with surgery.  They do not appear to be obstructed.  Imp: The patient has a gram-negative bacteremia of unclear etiology.  Most likely culprits would be from his pneumonia or from a pyelonephritis, unrecognized. Recommendations: Continue with [facility] mentor [place] follow-up culture results.  Would recommend a ostomy consult for the patient which would help him immensely as well as physical therapy.  Would also consider nutritional consults for suggestions on supplements to help boost his nutritional level.  Thank you for involving me in this patient's care,  will continue to follow along.  Louis Meckel W

## 2015-05-20 NOTE — Care Management Note (Signed)
Case Management Note  Patient Details  Name: [name] MRN: [id number] Date of Birth: 11-18-1955  Subjective/Objective:          sepsis          Action/Plan:Date: May 20, 2015 Chart reviewed for concurrent status and case management needs. Will continue to follow patient for changes and needs: Velva Harman, RN, BSN, [place]   [phone]   Expected Discharge Date:                  Expected Discharge Plan:  Home/Self Care  In-House Referral:  NA  Discharge planning Services  CM Consult  Post Acute Care Choice:  NA Choice offered to:  NA  DME Arranged:    DME Agency:     HH Arranged:    HH Agency:     Status of Service:  In process, will continue to follow  Medicare Important Message Given:    Date Medicare IM Given:    Medicare IM give by:    Date Additional Medicare IM Given:    Additional Medicare Important Message give by:     If discussed at [place] of Stay Meetings, dates discussed:    Additional Comments:  Leeroy Cha, RN 05/20/2015, 10:15 AM

## 2015-05-20 NOTE — Consult Note (Signed)
WOC wound consult note Reason for Consult: midline wound at umbilicus Wound type:surgica; Pressure Ulcer POA: No Measurement: 1.5cm round x 0.2cm Wound bed:red, moist Drainage (amount, consistency, odor) scant serous to serosanguinous Periwound: intact, erythematous, indurated Dressing procedure/placement/frequency: I will fill defect with a calcium alginate dressing in hopes that it will enhance healing and not disrupt pouching system (which is quite close to wound)  WOC ostomy consult note Stoma type/location: RLQ ileal conduit Stomal assessment/size: oval, 7/8 inch x 1.5 inches, slightly budded, red, moist Peristomal assessment: intact, clear Treatment options for stomal/peristomal skin: skin barrier ring Output Clear yellow urine Ostomy pouching: 1pc.convex ostomy pouching system with skin barrier ring (whole). Pouch is cut off-center.  Education provided: None. We will try to save any leaking pouches for evaluation as to cause of leakage.  Wife will bring in small belt from home. Enrolled patient in [facility] program: Yes, previously   [facility] nursing team will remain available to this patient, the nursing and medical teams.   Thanks, Maudie Flakes, MSN, RN, [place], [facility]  Pager# [phone]

## 2015-05-21 ENCOUNTER — Ambulatory Visit (HOSPITAL_COMMUNITY): Payer: BLUE CROSS/BLUE SHIELD

## 2015-05-21 ENCOUNTER — Other Ambulatory Visit: Payer: Self-pay

## 2015-05-21 ENCOUNTER — Inpatient Hospital Stay (HOSPITAL_COMMUNITY): Payer: BLUE CROSS/BLUE SHIELD

## 2015-05-21 DIAGNOSIS — R7881 Bacteremia: Secondary | ICD-10-CM | POA: Insufficient documentation

## 2015-05-21 DIAGNOSIS — A4150 Gram-negative sepsis, unspecified: Principal | ICD-10-CM

## 2015-05-21 DIAGNOSIS — E43 Unspecified severe protein-calorie malnutrition: Secondary | ICD-10-CM

## 2015-05-21 DIAGNOSIS — Z9079 Acquired absence of other genital organ(s): Secondary | ICD-10-CM

## 2015-05-21 DIAGNOSIS — K828 Other specified diseases of gallbladder: Secondary | ICD-10-CM

## 2015-05-21 DIAGNOSIS — C679 Malignant neoplasm of bladder, unspecified: Secondary | ICD-10-CM

## 2015-05-21 DIAGNOSIS — I82621 Acute embolism and thrombosis of deep veins of right upper extremity: Secondary | ICD-10-CM | POA: Insufficient documentation

## 2015-05-21 DIAGNOSIS — Z86718 Personal history of other venous thrombosis and embolism: Secondary | ICD-10-CM

## 2015-05-21 DIAGNOSIS — A419 Sepsis, unspecified organism: Secondary | ICD-10-CM | POA: Insufficient documentation

## 2015-05-21 DIAGNOSIS — Z906 Acquired absence of other parts of urinary tract: Secondary | ICD-10-CM

## 2015-05-21 DIAGNOSIS — Z8546 Personal history of malignant neoplasm of prostate: Secondary | ICD-10-CM

## 2015-05-21 DIAGNOSIS — R55 Syncope and collapse: Secondary | ICD-10-CM

## 2015-05-21 DIAGNOSIS — R6521 Severe sepsis with septic shock: Secondary | ICD-10-CM

## 2015-05-21 DIAGNOSIS — J690 Pneumonitis due to inhalation of food and vomit: Secondary | ICD-10-CM | POA: Insufficient documentation

## 2015-05-21 DIAGNOSIS — Z9889 Other specified postprocedural states: Secondary | ICD-10-CM

## 2015-05-21 DIAGNOSIS — N390 Urinary tract infection, site not specified: Secondary | ICD-10-CM | POA: Insufficient documentation

## 2015-05-21 LAB — COMPREHENSIVE METABOLIC PANEL
ALT: 29 U/L (ref 17–63)
ANION GAP: 6 (ref 5–15)
AST: 38 U/L (ref 15–41)
Albumin: 2.3 g/dL — ABNORMAL LOW (ref 3.5–5.0)
Alkaline Phosphatase: 427 U/L — ABNORMAL HIGH (ref 38–126)
BUN: 7 mg/dL (ref 6–20)
CHLORIDE: 104 mmol/L (ref 101–111)
CO2: 22 mmol/L (ref 22–32)
Calcium: 7.8 mg/dL — ABNORMAL LOW (ref 8.9–10.3)
Creatinine, Ser: 0.73 mg/dL (ref 0.61–1.24)
GFR calc non Af Amer: >60 mL/min (ref >60)
Glucose, Bld: 95 mg/dL (ref 65–99)
Potassium: 3.9 mmol/L (ref 3.5–5.1)
SODIUM: 132 mmol/L — AB (ref 135–145)
Total Bilirubin: 0.8 mg/dL (ref 0.3–1.2)
Total Protein: 5.6 g/dL — ABNORMAL LOW (ref 6.5–8.1)

## 2015-05-21 LAB — CBC
HCT: 25.5 % — ABNORMAL LOW (ref 39.0–52.0)
Hemoglobin: 8 g/dL — ABNORMAL LOW (ref 13.0–17.0)
MCH: 27.8 pg (ref 26.0–34.0)
MCHC: 31.4 g/dL (ref 30.0–36.0)
MCV: 88.5 fL (ref 78.0–100.0)
PLATELETS: 344 10*3/uL (ref 150–400)
RBC: 2.88 MIL/uL — AB (ref 4.22–5.81)
RDW: 15.7 % — ABNORMAL HIGH (ref 11.5–15.5)
WBC: 12.6 10*3/uL — ABNORMAL HIGH (ref 4.0–10.5)

## 2015-05-21 LAB — MAGNESIUM: Magnesium: 1.4 mg/dL — ABNORMAL LOW (ref 1.7–2.4)

## 2015-05-21 LAB — LACTIC ACID, PLASMA: LACTIC ACID, VENOUS: 0.9 mmol/L (ref 0.5–2.0)

## 2015-05-21 MED ORDER — CALCIUM CARBONATE ANTACID 500 MG PO CHEW
1.0000 | CHEWABLE_TABLET | Freq: Three times a day (TID) | ORAL | Status: DC
  Administered 2015-05-21 – 2015-05-24 (×10): 200 mg via ORAL
  Filled 2015-05-21 (×10): qty 1

## 2015-05-21 MED ORDER — MAGNESIUM SULFATE 2 GM/50ML IV SOLN
2.0000 g | Freq: Once | INTRAVENOUS | Status: AC
  Administered 2015-05-21: 2 g via INTRAVENOUS
  Filled 2015-05-21: qty 50

## 2015-05-21 MED ORDER — CHLORHEXIDINE GLUCONATE 0.12 % MT SOLN
15.0000 mL | Freq: Two times a day (BID) | OROMUCOSAL | Status: DC
  Administered 2015-05-21 – 2015-05-24 (×7): 15 mL via OROMUCOSAL
  Filled 2015-05-21 (×5): qty 15

## 2015-05-21 MED ORDER — CETYLPYRIDINIUM CHLORIDE 0.05 % MT LIQD
7.0000 mL | Freq: Two times a day (BID) | OROMUCOSAL | Status: DC
Start: 2015-05-21 — End: 2015-05-24
  Administered 2015-05-21 – 2015-05-23 (×5): 7 mL via OROMUCOSAL

## 2015-05-21 MED ORDER — FLEET ENEMA 7-19 GM/118ML RE ENEM
1.0000 | ENEMA | Freq: Once | RECTAL | Status: AC
Start: 2015-05-21 — End: 2015-05-21
  Administered 2015-05-21: 1 via RECTAL
  Filled 2015-05-21: qty 1

## 2015-05-21 MED ORDER — SODIUM CHLORIDE 0.9 % IV BOLUS (SEPSIS)
1000.0000 mL | Freq: Once | INTRAVENOUS | Status: AC
  Administered 2015-05-21: 1000 mL via INTRAVENOUS

## 2015-05-21 MED ORDER — SODIUM CHLORIDE 0.9 % IV SOLN
INTRAVENOUS | Status: AC
  Administered 2015-05-22: 06:00:00 via INTRAVENOUS

## 2015-05-21 NOTE — Evaluation (Signed)
Clinical/Bedside Swallow Evaluation Patient Details  Name: Howard Valentine MRN: [id number] Date of Birth: Sep 05, 1955  Today's Date: 05/21/2015 Time: SLP Start Time (ACUTE ONLY): 0840 SLP Stop Time (ACUTE ONLY): 0910 SLP Time Calculation (min) (ACUTE ONLY): 30 min  Past Medical History:  Past Medical History  Diagnosis Date  . Arthritis   . Hematuria   . Lower urinary tract symptoms (LUTS)   . Prostate cancer [facility]) urology--  dr herrick/ dr Valere Dross    DX 2014 ---  PSA 15.2,  Gleason 6/7,  vol 28cc,  s/p radical prostatectomy and external beam radiation therapy--  recurrent , PSA 0.04 (12-28-2014)  . Nocturia   . Urinary frequency    Past Surgical History:  Past Surgical History  Procedure Laterality Date  . Prostate biopsy    . Robot assisted laparoscopic radical prostatectomy N/A 02/08/2013    Procedure: ROBOTIC ASSISTED LAPAROSCOPIC RADICAL PROSTATECTOMY LEVEL 2;  Surgeon: Molli Hazard, MD;  [facility];  Service: Urology;  Laterality: N/A;  ROBOTIC PROSTATECTOMY, BILATERAL PELVIC LYMPH NODE DISSECTION, RIGHT NON NERVE SPARING   . Lymphadenectomy Bilateral 02/08/2013    Procedure: LYMPHADENECTOMY;  Surgeon: Molli Hazard, MD;  [facility];  Service: Urology;  Laterality: Bilateral;  . Cystoscopy with retrograde pyelogram, ureteroscopy and stent placement Bilateral 03/01/2015    Procedure: CYSTOSCOPY WITH BLADDER BIOPSY RETROGRADE BILATERAL  PYELOGRAM;  Surgeon: [name], MD;  Location: [facility];  Service: Urology;  Laterality: Bilateral;  . Cystectomy N/A 04/01/2015    Procedure: CYSTECTOMY WITH ILEAL CONDUIT ;  Surgeon: [name], MD;  [facility];  Service: Urology;  Laterality: N/A;  . Irrigation and debridement abscess N/A 04/15/2015    Procedure: CLOSURE OF WOUND DEHISCENCE;  Surgeon: [name], MD;  [facility];  Service: Urology;  Laterality: N/A;  . Ileo conduit     HPI:  59 yo male adm to [facility]  after having N/V - PMH + for bladder and prostate cancer, prior smoker s/p exploratoy lap 8/93 - complicated by wound dihesence.   Pt has h/o large pill dysphagia since he was ill he reports.  LUL pna diagnosed concerning for possible aspiration pna.  Swallow evaluation ordered.  .    Assessment / Plan / Recommendation Clinical Impression  CN exam unremarkable and pt with functional swallow observed.  He does report issues with xerostomia, large pill dysphagia and recent symptoms of reflux with and without hiccups.   Pt reports he takes Tums at home, ? if he would benefit from prescripiton would be helpful.    Suspect pt's vomiting and reflux may contribute to his pna - as pt reports choking some on emesis in car.   Educated pt to compensation strategies for xerostomia, pill dysphagia and written reflux precautions.  Pt also admitted to feeling like french toast was lodging  - pointing to lower esophagus.  Suspect exacerbation currently of reflux issues/dysphagia symptoms due to nausea/vomiting.    SLP to sign off, not follow up indicated.  Informed RN of findings and reflux concerns.       Aspiration Risk  Mild    Diet Recommendation Age appropriate regular solids;Thin   Medication Administration: Other (Comment) (attempt with solids) Compensations: Small sips/bites;Slow rate (start meal with liquids)    Other  Recommendations Oral Care Recommendations: Oral care BID   Follow Up Recommendations       Frequency and Duration        Pertinent Vitals/Pain febrile, decreased  Swallow Study Prior Functional Status   see hhx     General Date of Onset: 05/21/15 Other Pertinent Information: 59 yo male adm to [facility] after having N/V - PMH + for bladder and prostate cancer, prior smoker s/p exploratoy lap 0/97 - complicated by wound dihesence.   Pt has h/o large pill dysphagia since he was ill he reports.  LUL pna diagnosed concerning for possible aspiration pna.  Swallow evaluation  ordered.  .  Type of Study: Bedside swallow evaluation Diet Prior to this Study: Regular;Thin liquids Temperature Spikes Noted: Yes (low grade) Respiratory Status: Room air Behavior/Cognition: Alert;Cooperative;Pleasant mood Oral Cavity - Dentition: Adequate natural dentition/normal for age (few dentition missing) Self-Feeding Abilities: Able to feed self Patient Positioning: Upright in bed Baseline Vocal Quality: Normal Volitional Cough:  (DNT, but pt used incentive spirometer in his room) Volitional Swallow: Unable to elicit (secondary to xerostomia)    Oral/Motor/Sensory Function Overall Oral Motor/Sensory Function: Appears within functional limits for tasks assessed   Ice Chips Ice chips: Not tested   Thin Liquid Thin Liquid: Within functional limits Presentation: Straw    Nectar Thick Nectar Thick Liquid: Not tested   Honey Thick Honey Thick Liquid: Not tested   Puree Puree: Not tested   Solid   GO    Solid: Not tested       Luanna Salk, [place] SLP [phone]

## 2015-05-21 NOTE — Progress Notes (Signed)
Patient states he is feeling better.  He has been afebrile with stable vital signs. Patient seen by wound care as well as nutrition.  Filed Vitals:   05/21/15 0300 05/21/15 0400 05/21/15 0500 05/21/15 0600  BP:  131/83  126/74  Pulse: 80 81 77 80  Temp:      TempSrc:      Resp: 15 16 13 18   Height:      Weight:      SpO2: 98% 94% 100% 97%    Intake/Output Summary (Last 24 hours) at 05/21/15 0835 Last data filed at 05/21/15 0600  Gross per 24 hour  Intake   3450 ml  Output   1400 ml  Net   2050 ml  NAD Nonlabored breathing Regular heart rate Abdomen is soft Patient's midline wound is healing. Below the open area the erythema is improving, there is an area that is pointing, and draining serous fluid.   Recent Labs  05/19/15 1623 05/20/15 0410 05/21/15 0335  WBC 23.3* 27.7* 12.6*  HGB 10.3* 8.6* 8.0*  HCT 32.5* 28.5* 25.5*    Recent Labs  05/19/15 1623 05/20/15 0410 05/21/15 0335  NA 135 135 132*  K 4.4 4.2 3.9  CL 102 103 104  CO2 25 23 22   GLUCOSE 114* 108* 95  BUN 9 7 7   CREATININE 0.80 0.86 0.73  CALCIUM 9.0 8.3* 7.8*    Recent Labs  05/19/15 1623  INR 1.36   No results for input(s): PSA in the last 72 hours. No results for input(s): LABURIN in the last 72 hours. Results for orders placed or performed during the hospital encounter of 05/19/15  Urine culture     Status: None (Preliminary result)   Collection Time: 05/19/15  2:30 PM  Result Value Ref Range Status   Specimen Description URINE, CATHETERIZED  Final   Special Requests NONE  Final   Culture   Final    >=100,000 COLONIES/mL GRAM NEGATIVE RODS Performed at [facility]    Report Status PENDING  Incomplete  Culture, blood (routine x 2)     Status: None (Preliminary result)   Collection Time: 05/19/15  5:00 PM  Result Value Ref Range Status   Specimen Description BLOOD LEFT HAND  Final   Special Requests BOTTLES DRAWN AEROBIC ONLY 5CC  Final   Culture  Setup Time   Final   GRAM NEGATIVE RODS AEROBIC BOTTLE ONLY CRITICAL RESULT CALLED TO, READ BACK BY AND VERIFIED WITH: C. HOEFLER,RN AT [id number] ON [id number] BY Rhea Bleacher    Culture   Final    NO GROWTH < 24 HOURS Performed at [facility]    Report Status PENDING  Incomplete  Culture, blood (routine x 2)     Status: None (Preliminary result)   Collection Time: 05/19/15  5:02 PM  Result Value Ref Range Status   Specimen Description BLOOD BLOOD LEFT FOREARM  Final   Special Requests BOTTLES DRAWN AEROBIC AND ANAEROBIC 5 CC  Final   Culture  Setup Time   Final    GRAM NEGATIVE RODS IN BOTH AEROBIC AND ANAEROBIC BOTTLES CRITICAL RESULT CALLED TO, READ BACK BY AND VERIFIED WITH: C. HOEFLER,RN AT [id number] ON [id number] BY Rhea Bleacher    Culture   Final    NO GROWTH < 24 HOURS Performed at [facility]    Report Status PENDING  Incomplete  MRSA PCR Screening     Status: None   Collection Time: 05/19/15  8:00 PM  Result  Value Ref Range Status   MRSA by PCR NEGATIVE NEGATIVE Final    Comment:        The GeneXpert MRSA Assay (FDA approved for NASAL specimens only), is one component of a comprehensive MRSA colonization surveillance program. It is not intended to diagnose MRSA infection nor to guide or monitor treatment for MRSA infections.      Imp: Bacteremia from unclear source, likely from an unrecognized but atypical Pyelonephritis given his lack of fever or pneumonia.  He has gram-negative rods growing in his blood.  He is hemodynamically stable and appears to be improving on all fronts. Upper extremity DVT Lower midline wound  Rec: It may be worth basketing infectious disease whether or not the patient would require longer antibiotics given that he has a extensive upper extremity DVT in the setting of bacteremia.  I ordered a fleets enema given stool present in his rectum on CT scan.  I also ordered calcium carbonate for his reflux.  I really appreciate all the help in taking care of  this unfortunate patient.  I will continue to follow along and assist where I am able.

## 2015-05-21 NOTE — Progress Notes (Signed)
  Echocardiogram 2D Echocardiogram has been performed.  Howard Valentine 05/21/2015, 10:00 AM

## 2015-05-21 NOTE — H&P (Signed)
[facility]    Date of Admission:  05/19/2015  Date of Consult:  05/21/2015  Reason for Consult: Gram Negative Bacteremia Referring Physician: Dr. Arthur Holms   HPI: Howard Valentine is an 59 y.o. male. with a past medical history of prostate cancer with pathology showing adenocarcinoma stage T2C N0 undergoing robotic-assisted laparoscopic radical prostatectomy performed on 02/08/2013. He was recently diagnosed with high grade urothelial carcinoma of bladder undergoing cystoscopy procedure performed by Dr. Louis Meckel of urology on 03/01/2015. He subsequently underwent radical cystectomy on 04/01/2015 with pathology showing infiltrative high-grade urothelial carcinoma with plasmacytoid variant, Stage T3a N1. Surgery was complicated by wound dehiscence and required repeat exploratory laparotomy. This hospitalization was complicated by the development of DVT. He was ultimately discharged from the urology service on 04/20/2015 on cephalexin 500 mg every 6 hours. He was also discharged on Xarelto. He saw Dr.Shadad in the office on 04/30/2015 at which point adjuvant chemotherapy was discussed. ON 10/30/116: he was brought into the emergency department by EMS. He was in the car with his wife earlier in the day when he experienced several episodes of nausea and vomiting then passed out for about a minute. At that time he denied chest pain, shortness of breath, palpitations, focal neurological deficits prior to event. His wife call 911 from the car. EMS personnel attempted to get amount of the car when he had a second syncopal event. In the emergency department initial workup revealed a white count of 23,300 with a lactate of 2.17. Vitals showed temperature 101.6 with heart rate of 122. He was started on broad-spectrum IV and microbial therapy with vancomycin and Zosyn. Since then he has grown GNR in blood 2/2 blood cultures and from urine. CT of the abdomen showed:  1.   presence of new left lower lobe airspace disease suspicious for pneumonia or aspiration. There is also minimal right lower lobe involvement. 2. New periportal edema in the liver with gallbladder distention, but no biliary dilatation or gallbladder wall thickening. 3. Increased inflammatory changes in the upper abdomen with mesenteric and retroperitoneal edema, wall thickening of the distal stomach, duodenum and transverse colon. A unifying explanation for these findings is not obvious. No significant residual bowel obstruction or drainable fluid collection demonstrated. 4. Interval ureteral stent removal without evidence of ureteral obstruction. 5. No evidence of metastatic disease.   Past Medical History  Diagnosis Date  . Arthritis   . Hematuria   . Lower urinary tract symptoms (LUTS)   . Prostate cancer [facility]) urology--  dr herrick/ dr Valere Dross    DX 2014 ---  PSA 15.2,  Gleason 6/7,  vol 28cc,  s/p radical prostatectomy and external beam radiation therapy--  recurrent , PSA 0.04 (12-28-2014)  . Nocturia   . Urinary frequency     Past Surgical History  Procedure Laterality Date  . Prostate biopsy    . Robot assisted laparoscopic radical prostatectomy N/A 02/08/2013    Procedure: ROBOTIC ASSISTED LAPAROSCOPIC RADICAL PROSTATECTOMY LEVEL 2;  Surgeon: Molli Hazard, MD;  Location: [facility];  Service: Urology;  Laterality: N/A;  ROBOTIC PROSTATECTOMY, BILATERAL PELVIC LYMPH NODE DISSECTION, RIGHT NON NERVE SPARING   . Lymphadenectomy Bilateral 02/08/2013    Procedure: LYMPHADENECTOMY;  Surgeon: Molli Hazard, MD;  Location: [facility];  Service: Urology;  Laterality: Bilateral;  . Cystoscopy with retrograde pyelogram, ureteroscopy and stent placement Bilateral 03/01/2015    Procedure: CYSTOSCOPY WITH BLADDER BIOPSY RETROGRADE BILATERAL  PYELOGRAM;  Surgeon: [name], MD;  Location: [facility];  Service: Urology;  Laterality: Bilateral;  . Cystectomy  N/A 04/01/2015    Procedure: CYSTECTOMY WITH ILEAL CONDUIT ;  Surgeon: [name], MD;  Location: [facility];  Service: Urology;  Laterality: N/A;  . Irrigation and debridement abscess N/A 04/15/2015    Procedure: CLOSURE OF WOUND DEHISCENCE;  Surgeon: [name], MD;  Location: [facility];  Service: Urology;  Laterality: N/A;  . Ileo conduit      Social History:  reports that he quit smoking about 15 years ago. His smoking use included Cigarettes. He has a 20 pack-year smoking history. He has never used smokeless tobacco. He reports that he drinks about 12.6 oz of alcohol per week. He reports that he does not use illicit drugs.   Family History  Problem Relation Age of Onset  . Colon cancer      No Known Allergies   Medications: I have reviewed patients current medications as documented in Epic Anti-infectives    Start     Dose/Rate Route Frequency Ordered Stop   05/20/15 1100  vancomycin (VANCOCIN) IVPB 750 mg/150 ml premix  Status:  Discontinued     750 mg 150 mL/hr over 60 Minutes Intravenous Every 12 hours 05/20/15 0957 05/21/15 1131   05/20/15 0500  vancomycin (VANCOCIN) IVPB 750 mg/150 ml premix  Status:  Discontinued     750 mg 150 mL/hr over 60 Minutes Intravenous Every 12 hours 05/19/15 1708 05/19/15 1747   05/19/15 2300  piperacillin-tazobactam (ZOSYN) IVPB 3.375 g     3.375 g 12.5 mL/hr over 240 Minutes Intravenous 3 times per day 05/19/15 1710     05/19/15 1800  piperacillin-tazobactam (ZOSYN) IVPB 3.375 g     3.375 g 100 mL/hr over 30 Minutes Intravenous  Once 05/19/15 1747 05/19/15 1837   05/19/15 1800  vancomycin (VANCOCIN) IVPB 1000 mg/200 mL premix     1,000 mg 200 mL/hr over 60 Minutes Intravenous  Once 05/19/15 1747 05/19/15 1907   05/19/15 1645  piperacillin-tazobactam (ZOSYN) IVPB 3.375 g     3.375 g 100 mL/hr over 30 Minutes Intravenous  Once 05/19/15 1639 05/19/15 1734   05/19/15 1645  vancomycin (VANCOCIN) IVPB 1000 mg/200 mL premix     1,000  mg 200 mL/hr over 60 Minutes Intravenous  Once 05/19/15 1639 05/19/15 1807         ROS: as in HPI otherwise negative on 12 point revew   Blood pressure 147/70, pulse 79, temperature 98 F (36.7 C), temperature source Oral, resp. rate 18, height 5' 3"  (1.6 m), weight 135 lb (61.236 kg), SpO2 98 %. General: Alert and awake, oriented x3, not in any acute distress. HEENT:   EOMI, oropharynx clear and without exudate Cardiovascular: regular rate, normal r,  no murmur rubs or gallops Pulmonary: clear to auscultation bilaterally, no wheezing, rales or rhonchi Gastrointestinal: soft nontender, nondistended, normal bowel sounds, his conduit bag  Is with clear urine, his wound is dressed and clean from outside of bandage Musculoskeletal: no  clubbing or edema noted bilaterally Skin, soft tissue: no rashes Neuro: nonfocal, strength and sensation intact   Results for orders placed or performed during the hospital encounter of 05/19/15 (from the past 48 hour(s))  CBC with Differential/Platelet     Status: Abnormal   Collection Time: 05/19/15  4:23 PM  Result Value Ref Range   WBC 23.3 (H) 4.0 - 10.5 K/uL   RBC 3.71 (L) 4.22 -  5.81 MIL/uL   Hemoglobin 10.3 (L) 13.0 - 17.0 g/dL   HCT 32.5 (L) 39.0 - 52.0 %   MCV 87.6 78.0 - 100.0 fL   MCH 27.8 26.0 - 34.0 pg   MCHC 31.7 30.0 - 36.0 g/dL   RDW 15.2 11.5 - 15.5 %   Platelets 440 (H) 150 - 400 K/uL   Neutrophils Relative % 91 %   Neutro Abs 21.1 (H) 1.7 - 7.7 K/uL   Lymphocytes Relative 3 %   Lymphs Abs 0.7 0.7 - 4.0 K/uL   Monocytes Relative 6 %   Monocytes Absolute 1.5 (H) 0.1 - 1.0 K/uL   Eosinophils Relative 0 %   Eosinophils Absolute 0.0 0.0 - 0.7 K/uL   Basophils Relative 0 %   Basophils Absolute 0.0 0.0 - 0.1 K/uL  Comprehensive metabolic panel     Status: Abnormal   Collection Time: 05/19/15  4:23 PM  Result Value Ref Range   Sodium 135 135 - 145 mmol/L   Potassium 4.4 3.5 - 5.1 mmol/L   Chloride 102 101 - 111 mmol/L   CO2  25 22 - 32 mmol/L   Glucose, Bld 114 (H) 65 - 99 mg/dL   BUN 9 6 - 20 mg/dL   Creatinine, Ser 0.80 0.61 - 1.24 mg/dL   Calcium 9.0 8.9 - 10.3 mg/dL   Total Protein 7.3 6.5 - 8.1 g/dL   Albumin 3.3 (L) 3.5 - 5.0 g/dL   AST 48 (H) 15 - 41 U/L   ALT 44 17 - 63 U/L   Alkaline Phosphatase 779 (H) 38 - 126 U/L   Total Bilirubin 0.6 0.3 - 1.2 mg/dL   GFR calc non Af Amer >60 >60 mL/min   GFR calc Af Amer >60 >60 mL/min    Comment: (NOTE) The eGFR has been calculated using the CKD EPI equation. This calculation has not been validated in all clinical situations. eGFR's persistently <60 mL/min signify possible Chronic Kidney Disease.    Anion gap 8 5 - 15  Procalcitonin     Status: None   Collection Time: 05/19/15  4:23 PM  Result Value Ref Range   Procalcitonin 0.26 ng/mL    Comment:        Interpretation: PCT (Procalcitonin) <= 0.5 ng/mL: Systemic infection (sepsis) is not likely. Local bacterial infection is possible. (NOTE)         ICU PCT Algorithm               Non ICU PCT Algorithm    ----------------------------     ------------------------------         PCT < 0.25 ng/mL                 PCT < 0.1 ng/mL     Stopping of antibiotics            Stopping of antibiotics       strongly encouraged.               strongly encouraged.    ----------------------------     ------------------------------       PCT level decrease by               PCT < 0.25 ng/mL       >= 80% from peak PCT       OR PCT 0.25 - 0.5 ng/mL          Stopping of antibiotics  encouraged.     Stopping of antibiotics           encouraged.    ----------------------------     ------------------------------       PCT level decrease by              PCT >= 0.25 ng/mL       < 80% from peak PCT        AND PCT >= 0.5 ng/mL            Continuin g antibiotics                                              encouraged.       Continuing antibiotics            encouraged.     ----------------------------     ------------------------------     PCT level increase compared          PCT > 0.5 ng/mL         with peak PCT AND          PCT >= 0.5 ng/mL             Escalation of antibiotics                                          strongly encouraged.      Escalation of antibiotics        strongly encouraged.   Protime-INR     Status: Abnormal   Collection Time: 05/19/15  4:23 PM  Result Value Ref Range   Prothrombin Time 16.9 (H) 11.6 - 15.2 seconds   INR 1.36 0.00 - 1.49  APTT     Status: None   Collection Time: 05/19/15  4:23 PM  Result Value Ref Range   aPTT 34 24 - 37 seconds  I-Stat CG4 Lactic Acid, ED     Status: Abnormal   Collection Time: 05/19/15  4:42 PM  Result Value Ref Range   Lactic Acid, Venous 2.17 (HH) 0.5 - 2.0 mmol/L   Comment NOTIFIED PHYSICIAN   POC occult blood, ED RN will collect     Status: Abnormal   Collection Time: 05/19/15  4:57 PM  Result Value Ref Range   Fecal Occult Bld POSITIVE (A) NEGATIVE  Culture, blood (routine x 2)     Status: None (Preliminary result)   Collection Time: 05/19/15  5:00 PM  Result Value Ref Range   Specimen Description BLOOD LEFT HAND    Special Requests BOTTLES DRAWN AEROBIC ONLY 5CC    Culture  Setup Time      GRAM NEGATIVE RODS AEROBIC BOTTLE ONLY CRITICAL RESULT CALLED TO, READ BACK BY AND VERIFIED WITH: C. HOEFLER,RN AT [place] ON [id number] BY Rhea Bleacher    Culture      GRAM NEGATIVE RODS Performed at [facility]    Report Status PENDING   Culture, blood (routine x 2)     Status: None (Preliminary result)   Collection Time: 05/19/15  5:02 PM  Result Value Ref Range   Specimen Description BLOOD BLOOD LEFT FOREARM    Special Requests BOTTLES DRAWN AEROBIC AND ANAEROBIC 5 CC    Culture  Setup Time      GRAM NEGATIVE  RODS IN BOTH AEROBIC AND ANAEROBIC BOTTLES CRITICAL RESULT CALLED TO, READ BACK BY AND VERIFIED WITH: C. HOEFLER,RN AT [place] ON [id number] BY Rhea Bleacher    Culture      GRAM  NEGATIVE RODS Performed at [facility]    Report Status PENDING   Lactic acid, plasma     Status: None   Collection Time: 05/19/15  6:20 PM  Result Value Ref Range   Lactic Acid, Venous 1.8 0.5 - 2.0 mmol/L  MRSA PCR Screening     Status: None   Collection Time: 05/19/15  8:00 PM  Result Value Ref Range   MRSA by PCR NEGATIVE NEGATIVE    Comment:        The GeneXpert MRSA Assay (FDA approved for NASAL specimens only), is one component of a comprehensive MRSA colonization surveillance program. It is not intended to diagnose MRSA infection nor to guide or monitor treatment for MRSA infections.   Lactic acid, plasma     Status: None   Collection Time: 05/19/15  9:06 PM  Result Value Ref Range   Lactic Acid, Venous 1.1 0.5 - 2.0 mmol/L  Comprehensive metabolic panel     Status: Abnormal   Collection Time: 05/20/15  4:10 AM  Result Value Ref Range   Sodium 135 135 - 145 mmol/L   Potassium 4.2 3.5 - 5.1 mmol/L   Chloride 103 101 - 111 mmol/L   CO2 23 22 - 32 mmol/L   Glucose, Bld 108 (H) 65 - 99 mg/dL   BUN 7 6 - 20 mg/dL   Creatinine, Ser 0.86 0.61 - 1.24 mg/dL   Calcium 8.3 (L) 8.9 - 10.3 mg/dL   Total Protein 6.8 6.5 - 8.1 g/dL   Albumin 2.6 (L) 3.5 - 5.0 g/dL   AST 44 (H) 15 - 41 U/L   ALT 38 17 - 63 U/L   Alkaline Phosphatase 613 (H) 38 - 126 U/L   Total Bilirubin 0.8 0.3 - 1.2 mg/dL   GFR calc non Af Amer >60 >60 mL/min   GFR calc Af Amer >60 >60 mL/min    Comment: (NOTE) The eGFR has been calculated using the CKD EPI equation. This calculation has not been validated in all clinical situations. eGFR's persistently <60 mL/min signify possible Chronic Kidney Disease.    Anion gap 9 5 - 15  CBC     Status: Abnormal   Collection Time: 05/20/15  4:10 AM  Result Value Ref Range   WBC 27.7 (H) 4.0 - 10.5 K/uL    Comment: CONSISTENT WITH PREVIOUS RESULT   RBC 3.22 (L) 4.22 - 5.81 MIL/uL   Hemoglobin 8.6 (L) 13.0 - 17.0 g/dL   HCT 28.5 (L) 39.0 - 52.0 %    MCV 88.5 78.0 - 100.0 fL   MCH 26.7 26.0 - 34.0 pg   MCHC 30.2 30.0 - 36.0 g/dL   RDW 15.6 (H) 11.5 - 15.5 %   Platelets 409 (H) 150 - 400 K/uL  CBC     Status: Abnormal   Collection Time: 05/21/15  3:35 AM  Result Value Ref Range   WBC 12.6 (H) 4.0 - 10.5 K/uL   RBC 2.88 (L) 4.22 - 5.81 MIL/uL   Hemoglobin 8.0 (L) 13.0 - 17.0 g/dL   HCT 25.5 (L) 39.0 - 52.0 %   MCV 88.5 78.0 - 100.0 fL   MCH 27.8 26.0 - 34.0 pg   MCHC 31.4 30.0 - 36.0 g/dL   RDW 15.7 (H) 11.5 - 15.5 %  Platelets 344 150 - 400 K/uL  Comprehensive metabolic panel     Status: Abnormal   Collection Time: 05/21/15  3:35 AM  Result Value Ref Range   Sodium 132 (L) 135 - 145 mmol/L   Potassium 3.9 3.5 - 5.1 mmol/L   Chloride 104 101 - 111 mmol/L   CO2 22 22 - 32 mmol/L   Glucose, Bld 95 65 - 99 mg/dL   BUN 7 6 - 20 mg/dL   Creatinine, Ser 0.73 0.61 - 1.24 mg/dL   Calcium 7.8 (L) 8.9 - 10.3 mg/dL   Total Protein 5.6 (L) 6.5 - 8.1 g/dL   Albumin 2.3 (L) 3.5 - 5.0 g/dL   AST 38 15 - 41 U/L   ALT 29 17 - 63 U/L   Alkaline Phosphatase 427 (H) 38 - 126 U/L   Total Bilirubin 0.8 0.3 - 1.2 mg/dL   GFR calc non Af Amer >60 >60 mL/min   GFR calc Af Amer >60 >60 mL/min    Comment: (NOTE) The eGFR has been calculated using the CKD EPI equation. This calculation has not been validated in all clinical situations. eGFR's persistently <60 mL/min signify possible Chronic Kidney Disease.    Anion gap 6 5 - 15  Magnesium     Status: Abnormal   Collection Time: 05/21/15  3:35 AM  Result Value Ref Range   Magnesium 1.4 (L) 1.7 - 2.4 mg/dL  Lactic acid, plasma     Status: None   Collection Time: 05/21/15  3:35 AM  Result Value Ref Range   Lactic Acid, Venous 0.9 0.5 - 2.0 mmol/L   @BRIEFLABTABLE (sdes,specrequest,cult,reptstatus)   ) Recent Results (from the past 720 hour(s))  Urine culture     Status: None (Preliminary result)   Collection Time: 05/19/15  2:30 PM  Result Value Ref Range Status   Specimen Description  URINE, CATHETERIZED  Final   Special Requests NONE  Final   Culture   Final    >=100,000 COLONIES/mL GRAM NEGATIVE RODS Performed at [facility]    Report Status PENDING  Incomplete  Culture, blood (routine x 2)     Status: None (Preliminary result)   Collection Time: 05/19/15  5:00 PM  Result Value Ref Range Status   Specimen Description BLOOD LEFT HAND  Final   Special Requests BOTTLES DRAWN AEROBIC ONLY 5CC  Final   Culture  Setup Time   Final    GRAM NEGATIVE RODS AEROBIC BOTTLE ONLY CRITICAL RESULT CALLED TO, READ BACK BY AND VERIFIED WITH: C. HOEFLER,RN AT [id number] ON [id number] BY Rhea Bleacher    Culture   Final    GRAM NEGATIVE RODS Performed at [facility]    Report Status PENDING  Incomplete  Culture, blood (routine x 2)     Status: None (Preliminary result)   Collection Time: 05/19/15  5:02 PM  Result Value Ref Range Status   Specimen Description BLOOD BLOOD LEFT FOREARM  Final   Special Requests BOTTLES DRAWN AEROBIC AND ANAEROBIC 5 CC  Final   Culture  Setup Time   Final    GRAM NEGATIVE RODS IN BOTH AEROBIC AND ANAEROBIC BOTTLES CRITICAL RESULT CALLED TO, READ BACK BY AND VERIFIED WITH: C. HOEFLER,RN AT [id number] ON [id number] BY Rhea Bleacher    Culture   Final    GRAM NEGATIVE RODS Performed at [facility]    Report Status PENDING  Incomplete  MRSA PCR Screening     Status: None   Collection Time: 05/19/15  8:00 PM  Result Value Ref Range Status   MRSA by PCR NEGATIVE NEGATIVE Final    Comment:        The GeneXpert MRSA Assay (FDA approved for NASAL specimens only), is one component of a comprehensive MRSA colonization surveillance program. It is not intended to diagnose MRSA infection nor to guide or monitor treatment for MRSA infections.      Impression/Recommendation  Principal Problem:   Sepsis ([place]) Active Problems:   Bladder cancer ([place])   Syncope and collapse   Aspiration pneumonia ([place])   DVT (deep venous thrombosis) ([place])    Protein-calorie malnutrition, severe   WILLI BOROWIAK is a 59 y.o. male with hx prostate cancer sp robotic-assisted laparoscopic radical prostatectom in  02/08/2013, more recently diagnosed with high grade urothelial carcinoma of bladder sp cystoscopy and radical cystectomy  With complications including wound dehiscence requiring ex-lap, DVT no admitted with gram negative septic shock  and syncope  #1 Gram negative sepsis: Given that urine is also growing GNR it may indeed be the source though we do not have ID or sensis at this point. His CT scan shows consolidation c/w possible PNA though gram negatives are not as common culprits for PNA. He also has some GB distention raising question of biliary source  --continue zoysn for now --repeat blood cultures to ensure clearance   #2 Screening: check for HIV and HCV  05/21/2015, 3:25 PM   Thank you so much for this interesting consult  [place] for [place] [phone] (pager) [phone] (office) 05/21/2015, 3:25 PM  Rhina [place] 05/21/2015, 3:25 PM

## 2015-05-21 NOTE — Consult Note (Signed)
WOC ostomy follow up Stoma type/location: RLQ ileal conduit (pouch applied yesterday at 6pm is intact and is at 24 hours; leak today due to adapter becoming disconnected by bedside urinary drainage bag,) Stomal assessment/size: oval, 7/8 inches x 1.5cm, slightly budded, red, moist Peristomal assessment: not seen today Treatment options for stomal/peristomal skin: skin barrier ring, cut pouching system off-center Output clear yellow urine Ostomy pouching: 1pc.convex with skin barrier ring.  Education provided: None Enrolled patient in [employer] Discharge program: Yes/, previously Note:  Large "bulge" is noted in the LLQ; soft.  No erythema, induration. Not painful.  Not noted yesterday.  WOC wound follow up Wound care performed to midline incision opening with calcium alginate dressings as it has not yet been done today.  [facility] nursing team will remain available to this patient, the nursing and medical teams.   Thanks, Maudie Flakes, MSN, RN, [place], Arther Abbott  Pager# [phone]

## 2015-05-21 NOTE — Progress Notes (Addendum)
PROGRESS NOTE  CARDER YIN GGE:[id number] DOB: Nov 26, 1955 DOA: 05/19/2015 PCP: [name], MD  HPI/Recap of past 24 hours:  Had enema for constipation, report feeling better, no cough, no sob, on room air. Denies pain. Denies dizziness upon standing up.  Assessment/Plan: Principal Problem:   Sepsis ([place]) Active Problems:   Bladder cancer (HCC)   Syncope and collapse   Aspiration pneumonia (HCC)   DVT (deep venous thrombosis) (HCC)   Protein-calorie malnutrition, severe  Sepsis with leukocytosis, fever, sinus tachycardia, lactic acidosis on presentation, on ivf/abx. Improving, decrease ivf  To 75cc/hr, consider d/c ivf in 24hrs if adequate oral intake.  UTI/left lower lobe pna:  Started on vanc/zosyn to cover HCAP, regular diet thin liquid per swallow eval, (reported chronic history of difficulty swallowing pills) mrsa screen negative, blood culture g-rods, d/c vanc, continue zosyn, infectious disease consulted.  G- bacteremia: from uti? Ct ab/pel no abscess, abdominal wound minimal drainage,  On zosyn. Will need to repeat blood culture  In a few days to ensure clearance, infectious disease consulted.  Syncope: likely from sepsis. Echocardiogram adequate LVEF, no wall motion abnormality, grade 1 diastolic dysfunction.  Mild elevation of lft, improving, likely from sepsis.  H/o picc line associated right upper extremity DVT on xarelto.  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 S/p urostomy, ostomy RN and urology consulted. Outpatient oncology follow up to discuss adjuvant chemo.  H/o prostate cancer s/p robotic assisted laparoscopic radical prostatectomy in 01/2013.  Severe protein calorie malnutrition: from malignancy. Nutrition consult obtained.  Hypomagnesemia: replace mag. Repeat in am.    Code Status: full  Family Communication: patient   Disposition  Plan: transfer out of stepdown to med tele   Consultants:  Urology  Infectious disease  Procedures:  none  Antibiotics:  vanc from admission to 11/1  Zosyn from admission    Objective: BP 133/68 mmHg  Pulse 85  Temp(Src) 97.5 F (36.4 C) (Oral)  Resp 16  Ht 5\' 3"  (1.6 m)  Wt 135 lb (61.236 kg)  BMI 23.92 kg/m2  SpO2 98%  Intake/Output Summary (Last 24 hours) at 05/21/15 1149 Last data filed at 05/21/15 0900  Gross per 24 hour  Intake   3700 ml  Output   1600 ml  Net   2100 ml   Filed Weights   05/19/15 1642 05/19/15 1705  Weight: 130 lb (58.968 kg) 135 lb (61.236 kg)    Exam:   General:  NAD, frail  Cardiovascular: RRR  Respiratory: CTABL  Abdomen: Soft/ND/NT, positive BS, urostomy bag with clear urine, abdominal wound with minimal drainage,   Musculoskeletal: No Edema  Neuro: aaox3  Data Reviewed: Basic Metabolic Panel:  Recent Labs Lab 05/19/15 1623 05/20/15 0410 05/21/15 0335  NA 135 135 132*  K 4.4 4.2 3.9  CL 102 103 104  CO2 25 23 22   GLUCOSE 114* 108* 95  BUN 9 7 7   CREATININE 0.80 0.86 0.73  CALCIUM 9.0 8.3* 7.8*  MG  --   --  1.4*   Liver Function Tests:  Recent Labs Lab 05/19/15 1623 05/20/15 0410 05/21/15 0335  AST 48* 44* 38  ALT 44 38 29  ALKPHOS 779* 613* 427*  BILITOT 0.6 0.8 0.8  PROT 7.3 6.8 5.6*  ALBUMIN 3.3* 2.6* 2.3*   No results for input(s): LIPASE, AMYLASE in the last 168 hours. No results for input(s): AMMONIA in the last 168 hours. CBC:  Recent Labs Lab 05/19/15 1623 05/20/15 0410 05/21/15 0335  WBC 23.3* 27.7* 12.6*  NEUTROABS 21.1*  --   --   HGB 10.3* 8.6* 8.0*  HCT 32.5* 28.5* 25.5*  MCV 87.6 88.5 88.5  PLT 440* 409* 344   Cardiac Enzymes:   No results for input(s): CKTOTAL, CKMB, CKMBINDEX, TROPONINI in the last 168 hours. BNP (last 3 results) No results for input(s): BNP in the last 8760 hours.  ProBNP (last 3 results) No results for input(s): PROBNP in the last 8760  hours.  CBG:  Recent Labs Lab 05/19/15 1436  GLUCAP 103*    Recent Results (from the past 240 hour(s))  Urine culture     Status: None (Preliminary result)   Collection Time: 05/19/15  2:30 PM  Result Value Ref Range Status   Specimen Description URINE, CATHETERIZED  Final   Special Requests NONE  Final   Culture   Final    >=100,000 COLONIES/mL GRAM NEGATIVE RODS Performed at [facility]    Report Status PENDING  Incomplete  Culture, blood (routine x 2)     Status: None (Preliminary result)   Collection Time: 05/19/15  5:00 PM  Result Value Ref Range Status   Specimen Description BLOOD LEFT HAND  Final   Special Requests BOTTLES DRAWN AEROBIC ONLY 5CC  Final   Culture  Setup Time   Final    GRAM NEGATIVE RODS AEROBIC BOTTLE ONLY CRITICAL RESULT CALLED TO, READ BACK BY AND VERIFIED WITH: C. HOEFLER,RN AT 1696 ON [id number] BY Rhea Bleacher    Culture   Final    NO GROWTH < 24 HOURS Performed at [facility]    Report Status PENDING  Incomplete  Culture, blood (routine x 2)     Status: None (Preliminary result)   Collection Time: 05/19/15  5:02 PM  Result Value Ref Range Status   Specimen Description BLOOD BLOOD LEFT FOREARM  Final   Special Requests BOTTLES DRAWN AEROBIC AND ANAEROBIC 5 CC  Final   Culture  Setup Time   Final    GRAM NEGATIVE RODS IN BOTH AEROBIC AND ANAEROBIC BOTTLES CRITICAL RESULT CALLED TO, READ BACK BY AND VERIFIED WITH: C. HOEFLER,RN AT 0175 ON [id number] BY S. YARBROUGH    Culture   Final    NO GROWTH < 24 HOURS Performed at [facility]    Report Status PENDING  Incomplete  MRSA PCR Screening     Status: None   Collection Time: 05/19/15  8:00 PM  Result Value Ref Range Status   MRSA by PCR NEGATIVE NEGATIVE Final    Comment:        The GeneXpert MRSA Assay (FDA approved for NASAL specimens only), is one component of a comprehensive MRSA colonization surveillance program. It is not intended to diagnose MRSA infection  nor to guide or monitor treatment for MRSA infections.      Studies: No results found.  Scheduled Meds: . antiseptic oral rinse  7 mL Mouth Rinse q12n4p  . calcium carbonate  1 tablet Oral TID WC  . chlorhexidine  15 mL Mouth Rinse BID  . piperacillin-tazobactam (ZOSYN)  IV  3.375 g Intravenous 3 times per day  . rivaroxaban  20 mg Oral Q supper  . sodium chloride  3 mL Intravenous Q12H    Continuous Infusions: . sodium chloride       Time spent: 14mins  Gaetana Kawahara MD, PhD  [facility] Pager [phone]. If 7PM-7AM, please contact night-coverage at [email or web address], password  [email or web address] 05/21/2015, 11:49 AM  LOS: 2 days

## 2015-05-22 DIAGNOSIS — Z1612 Extended spectrum beta lactamase (ESBL) resistance: Secondary | ICD-10-CM

## 2015-05-22 DIAGNOSIS — A499 Bacterial infection, unspecified: Secondary | ICD-10-CM | POA: Insufficient documentation

## 2015-05-22 DIAGNOSIS — R6510 Systemic inflammatory response syndrome (SIRS) of non-infectious origin without acute organ dysfunction: Secondary | ICD-10-CM | POA: Insufficient documentation

## 2015-05-22 DIAGNOSIS — R7881 Bacteremia: Secondary | ICD-10-CM | POA: Insufficient documentation

## 2015-05-22 LAB — COMPREHENSIVE METABOLIC PANEL
ALBUMIN: 2.2 g/dL — AB (ref 3.5–5.0)
ALT: 23 U/L (ref 17–63)
AST: 23 U/L (ref 15–41)
Alkaline Phosphatase: 385 U/L — ABNORMAL HIGH (ref 38–126)
Anion gap: 7 (ref 5–15)
CHLORIDE: 103 mmol/L (ref 101–111)
CO2: 22 mmol/L (ref 22–32)
Calcium: 8 mg/dL — ABNORMAL LOW (ref 8.9–10.3)
Creatinine, Ser: 0.63 mg/dL (ref 0.61–1.24)
GFR calc Af Amer: >60 mL/min (ref >60)
Glucose, Bld: 95 mg/dL (ref 65–99)
POTASSIUM: 3 mmol/L — AB (ref 3.5–5.1)
SODIUM: 132 mmol/L — AB (ref 135–145)
Total Bilirubin: 0.6 mg/dL (ref 0.3–1.2)
Total Protein: 5.8 g/dL — ABNORMAL LOW (ref 6.5–8.1)

## 2015-05-22 LAB — CBC
HCT: 25.5 % — ABNORMAL LOW (ref 39.0–52.0)
Hemoglobin: 7.9 g/dL — ABNORMAL LOW (ref 13.0–17.0)
MCH: 26.7 pg (ref 26.0–34.0)
MCHC: 31 g/dL (ref 30.0–36.0)
MCV: 86.1 fL (ref 78.0–100.0)
PLATELETS: 381 10*3/uL (ref 150–400)
RBC: 2.96 MIL/uL — ABNORMAL LOW (ref 4.22–5.81)
RDW: 15.4 % (ref 11.5–15.5)
WBC: 12.1 10*3/uL — AB (ref 4.0–10.5)

## 2015-05-22 LAB — MAGNESIUM: MAGNESIUM: 1.7 mg/dL (ref 1.7–2.4)

## 2015-05-22 LAB — URINE CULTURE: Culture: >=100000

## 2015-05-22 LAB — HIV ANTIBODY (ROUTINE TESTING W REFLEX): HIV Screen 4th Generation wRfx: NONREACTIVE

## 2015-05-22 MED ORDER — ERTAPENEM SODIUM 1 G IJ SOLR: 1.0000 g | INTRAMUSCULAR | Status: DC

## 2015-05-22 MED ORDER — MAGNESIUM SULFATE 2 GM/50ML IV SOLN
2.0000 g | Freq: Once | INTRAVENOUS | Status: AC
  Administered 2015-05-22: 2 g via INTRAVENOUS
  Filled 2015-05-22: qty 50

## 2015-05-22 MED ORDER — POTASSIUM CHLORIDE CRYS ER 20 MEQ PO TBCR
40.0000 meq | EXTENDED_RELEASE_TABLET | ORAL | Status: AC
  Administered 2015-05-22 (×2): 40 meq via ORAL
  Filled 2015-05-22 (×2): qty 2

## 2015-05-22 MED ORDER — SODIUM CHLORIDE 0.9 % IV SOLN
1.0000 g | INTRAVENOUS | Status: AC
  Administered 2015-05-22 – 2015-05-23 (×2): 1 g via INTRAVENOUS
  Filled 2015-05-22 (×2): qty 1

## 2015-05-22 NOTE — Progress Notes (Signed)
PROGRESS NOTE  Howard Valentine YDX:[id number] DOB: 01-17-1956 DOA: 05/19/2015 PCP: [name], MD  HPI/Recap of past 24 hours:   feeling better, no fever, no cough, no sob, on room air. Denies pain. Ambulated in hallway with steady gait.  Assessment/Plan: Principal Problem:   Sepsis ([place]) Active Problems:   Bladder cancer (HCC)   Syncope and collapse   Aspiration pneumonia (HCC)   DVT (deep venous thrombosis) (HCC)   Protein-calorie malnutrition, severe   Aspiration pneumonia due to vomit (HCC)   Acute deep vein thrombosis (DVT) of right upper extremity (HCC)   UTI (lower urinary tract infection)   Bacteremia   Septic shock (HCC)  Sepsis with leukocytosis, fever, sinus tachycardia, lactic acidosis on presentation, on ivf/abx. Improving,d/c ivf now with adequate oral intake.  UTI/left lower lobe pna:  Started on vanc/zosyn to cover HCAP, regular diet thin liquid per swallow eval, (reported chronic history of difficulty swallowing pills) mrsa screen negative, blood culture g-rods, d/c vanc, continue zosyn, infectious disease consulted. Urine culture with ESBL, appreciate ID input, abx changed to invanz.   G- bacteremia:  from uti? Ct ab/pel no abscess, abdominal wound minimal drainage,  repeat blood culture done on 11/1, infectious disease input appreciated, was on zosyn , changed to invanz from 11/2 per ID recommendation. May need PICC line placement, due to h/o right upper extremity DVT, will need to place to left upper extremity.  Syncope: resolved, likely from sepsis. Echocardiogram adequate LVEF, no wall motion abnormality, grade 1 diastolic dysfunction.  Mild elevation of lft, improving, likely from sepsis.  H/o picc line associated right upper extremity DVT on xarelto.  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 S/p urostomy, ostomy RN and urology consulted.  Outpatient oncology follow up to discuss adjuvant chemo.  H/o prostate cancer s/p robotic assisted laparoscopic radical prostatectomy in 01/2013.  Severe protein calorie malnutrition: from malignancy. Nutrition consult obtained.  Hypokalemia/Hypomagnesemia: replace k and mag    Code Status: full  Family Communication: patient   Disposition Plan: home when medically stable, likely will need picc line/home health   Consultants:  Urology  Infectious disease  Procedures:  none  Antibiotics:  vanc from admission to 11/1  Zosyn from admission to 11/2  invanz from 11/2   Objective: BP 127/67 mmHg  Pulse 79  Temp(Src) 97.9 F (36.6 C) (Oral)  Resp 16  Ht 5\' 3"  (1.6 m)  Wt 135 lb (61.236 kg)  BMI 23.92 kg/m2  SpO2 100%  Intake/Output Summary (Last 24 hours) at 05/22/15 1612 Last data filed at 05/22/15 1420  Gross per 24 hour  Intake 1701.25 ml  Output   1853 ml  Net -151.75 ml   Filed Weights   05/19/15 1642 05/19/15 1705  Weight: 130 lb (58.968 kg) 135 lb (61.236 kg)    Exam:   General:  NAD,   Cardiovascular: RRR  Respiratory: CTABL  Abdomen: Soft/ND/NT, positive BS, urostomy bag with clear urine, abdominal wound with minimal drainage,   Musculoskeletal: No Edema  Neuro: aaox3  Data Reviewed: Basic Metabolic Panel:  Recent Labs Lab 05/19/15 1623 05/20/15 0410 05/21/15 0335 05/22/15 0427  NA 135 135 132* 132*  K 4.4 4.2 3.9 3.0*  CL 102 103 104 103  CO2 25 23 22 22   GLUCOSE 114* 108* 95 95  BUN 9 7 7  <5*  CREATININE 0.80 0.86 0.73 0.63  CALCIUM 9.0 8.3* 7.8* 8.0*  MG  --   --  1.4* 1.7   Liver Function Tests:  Recent Labs Lab 05/19/15 1623 05/20/15 0410 05/21/15 0335 05/22/15 0427  AST 48* 44* 38 23  ALT 44 38 29 23  ALKPHOS 779* 613* 427* 385*  BILITOT 0.6 0.8 0.8 0.6  PROT 7.3 6.8 5.6* 5.8*  ALBUMIN 3.3* 2.6* 2.3* 2.2*   No results for input(s): LIPASE, AMYLASE in the last 168 hours. No results for input(s): AMMONIA  in the last 168 hours. CBC:  Recent Labs Lab 05/19/15 1623 05/20/15 0410 05/21/15 0335 05/22/15 0427  WBC 23.3* 27.7* 12.6* 12.1*  NEUTROABS 21.1*  --   --   --   HGB 10.3* 8.6* 8.0* 7.9*  HCT 32.5* 28.5* 25.5* 25.5*  MCV 87.6 88.5 88.5 86.1  PLT 440* 409* 344 381   Cardiac Enzymes:   No results for input(s): CKTOTAL, CKMB, CKMBINDEX, TROPONINI in the last 168 hours. BNP (last 3 results) No results for input(s): BNP in the last 8760 hours.  ProBNP (last 3 results) No results for input(s): PROBNP in the last 8760 hours.  CBG:  Recent Labs Lab 05/19/15 1436  GLUCAP 103*    Recent Results (from the past 240 hour(s))  Urine culture     Status: None   Collection Time: 05/19/15  2:30 PM  Result Value Ref Range Status   Specimen Description URINE, CATHETERIZED  Final   Special Requests NONE  Final   Culture   Final    >=100,000 COLONIES/mL ESCHERICHIA COLI Confirmed Extended Spectrum Beta-Lactamase Producer (ESBL) Performed at [facility]    Report Status 05/22/2015 FINAL  Final   Organism ID, Bacteria ESCHERICHIA COLI  Final      Susceptibility   Escherichia coli - MIC*    AMPICILLIN >=32 RESISTANT Resistant     CEFAZOLIN >=64 RESISTANT Resistant     CEFTRIAXONE >=64 RESISTANT Resistant     CIPROFLOXACIN >=4 RESISTANT Resistant     GENTAMICIN <=1 SENSITIVE Sensitive     IMIPENEM <=0.25 SENSITIVE Sensitive     NITROFURANTOIN <=16 SENSITIVE Sensitive     TRIMETH/SULFA >=320 RESISTANT Resistant     AMPICILLIN/SULBACTAM 16 INTERMEDIATE Intermediate     PIP/TAZO <=4 SENSITIVE Sensitive     * >=100,000 COLONIES/mL ESCHERICHIA COLI  Culture, blood (routine x 2)     Status: None (Preliminary result)   Collection Time: 05/19/15  5:00 PM  Result Value Ref Range Status   Specimen Description BLOOD LEFT HAND  Final   Special Requests BOTTLES DRAWN AEROBIC ONLY 5CC  Final   Culture  Setup Time   Final    GRAM NEGATIVE RODS AEROBIC BOTTLE ONLY CRITICAL RESULT  CALLED TO, READ BACK BY AND VERIFIED WITH: C. HOEFLER,RN AT [place] ON [id number] BY Rhea Bleacher    Culture   Final    GRAM NEGATIVE RODS CULTURE REINCUBATED FOR BETTER GROWTH Performed at [facility]    Report Status PENDING  Incomplete  Culture, blood (routine x 2)     Status: None (Preliminary result)   Collection Time: 05/19/15  5:02 PM  Result Value Ref Range Status   Specimen Description BLOOD BLOOD LEFT FOREARM  Final   Special Requests BOTTLES DRAWN AEROBIC AND ANAEROBIC 5 CC  Final   Culture  Setup Time   Final    GRAM NEGATIVE RODS IN BOTH AEROBIC AND ANAEROBIC BOTTLES CRITICAL RESULT CALLED TO, READ BACK BY AND VERIFIED WITH: C. HOEFLER,RN AT [place] ON [id number] BY Rhea Bleacher    Culture   Final  GRAM NEGATIVE RODS CULTURE REINCUBATED FOR BETTER GROWTH Performed at [facility]    Report Status PENDING  Incomplete  MRSA PCR Screening     Status: None   Collection Time: 05/19/15  8:00 PM  Result Value Ref Range Status   MRSA by PCR NEGATIVE NEGATIVE Final    Comment:        The GeneXpert MRSA Assay (FDA approved for NASAL specimens only), is one component of a comprehensive MRSA colonization surveillance program. It is not intended to diagnose MRSA infection nor to guide or monitor treatment for MRSA infections.   Culture, blood (routine x 2)     Status: None (Preliminary result)   Collection Time: 05/21/15  4:25 PM  Result Value Ref Range Status   Specimen Description BLOOD LEFT HAND  Final   Special Requests BOTTLES DRAWN AEROBIC AND ANAEROBIC 5CC  Final   Culture   Final    NO GROWTH < 24 HOURS Performed at [facility]    Report Status PENDING  Incomplete  Culture, blood (routine x 2)     Status: None (Preliminary result)   Collection Time: 05/21/15  4:40 PM  Result Value Ref Range Status   Specimen Description BLOOD LEFT HAND  Final   Special Requests BOTTLES DRAWN AEROBIC AND ANAEROBIC 5CC  Final   Culture   Final    NO GROWTH < 24  HOURS Performed at [facility]    Report Status PENDING  Incomplete     Studies: No results found.  Scheduled Meds: . antiseptic oral rinse  7 mL Mouth Rinse q12n4p  . calcium carbonate  1 tablet Oral TID WC  . chlorhexidine  15 mL Mouth Rinse BID  . ertapenem  1 g Intravenous Q24H  . potassium chloride  40 mEq Oral Q4H  . rivaroxaban  20 mg Oral Q supper  . sodium chloride  3 mL Intravenous Q12H    Continuous Infusions:     Time spent: 74mins  Areatha Kalata MD, PhD  [facility] Pager [phone]. If 7PM-7AM, please contact night-coverage at [email or web address], password [facility] 05/22/2015, 4:12 PM  LOS: 3 days

## 2015-05-22 NOTE — Plan of Care (Signed)
Problem: Phase I Progression Outcomes Goal: Voiding-avoid urinary catheter unless indicated Outcome: Not Applicable Date Met:  68/08/81 Patient has ileal conduit

## 2015-05-22 NOTE — Progress Notes (Signed)
[facility]  Patient Status: Active (receiving services up to time of hospitalization)  [facility] is providing the following services: RN  If patient discharges after hours, please call [phone].   Kristen Hayworth 05/22/2015, 11:13 AM

## 2015-05-22 NOTE — Consult Note (Addendum)
WOC ostomy follow up Stoma type/location: RLQ ileal conduit Pouch intact from 05/20/15-approaching 48 hours wear time.  Precut pouch at bedside with additional supplies for patient to take home (pouches and rings). Patient will be seen by my partners in my absence. Patient is in supine position and I am implementing a therapeutic mattress replacement today with low air loss feature as well as a pressure redistribution chair pad for OOB use.  At risk for skin breakdown in the sacral area due to supine positioning due to comorbid conditions including decreased PO intake. [place] nursing team will remain available to this patient, the nursing and medical teams.   Thanks, Maudie Flakes, MSN, RN, [place], Arther Abbott  Pager# [phone]

## 2015-05-22 NOTE — Progress Notes (Signed)
[facility]    Subjective:  Feels better   Antibiotics:  Anti-infectives    Start     Dose/Rate Route Frequency Ordered Stop   05/22/15 1200  ertapenem (INVANZ) 1 g in sodium chloride 0.9 % 50 mL IVPB     1 g 100 mL/hr over 30 Minutes Intravenous Every 24 hours 05/22/15 1056     05/22/15 1100  ertapenem (INVANZ) injection 1 g  Status:  Discontinued     1 g Intramuscular Every 24 hours 05/22/15 1051 05/22/15 1055   05/20/15 1100  vancomycin (VANCOCIN) IVPB 750 mg/150 ml premix  Status:  Discontinued     750 mg 150 mL/hr over 60 Minutes Intravenous Every 12 hours 05/20/15 0957 05/21/15 1131   05/20/15 0500  vancomycin (VANCOCIN) IVPB 750 mg/150 ml premix  Status:  Discontinued     750 mg 150 mL/hr over 60 Minutes Intravenous Every 12 hours 05/19/15 1708 05/19/15 1747   05/19/15 2300  piperacillin-tazobactam (ZOSYN) IVPB 3.375 g  Status:  Discontinued     3.375 g 12.5 mL/hr over 240 Minutes Intravenous 3 times per day 05/19/15 1710 05/22/15 1051   05/19/15 1800  piperacillin-tazobactam (ZOSYN) IVPB 3.375 g     3.375 g 100 mL/hr over 30 Minutes Intravenous  Once 05/19/15 1747 05/19/15 1837   05/19/15 1800  vancomycin (VANCOCIN) IVPB 1000 mg/200 mL premix     1,000 mg 200 mL/hr over 60 Minutes Intravenous  Once 05/19/15 1747 05/19/15 1907   05/19/15 1645  piperacillin-tazobactam (ZOSYN) IVPB 3.375 g     3.375 g 100 mL/hr over 30 Minutes Intravenous  Once 05/19/15 1639 05/19/15 1734   05/19/15 1645  vancomycin (VANCOCIN) IVPB 1000 mg/200 mL premix     1,000 mg 200 mL/hr over 60 Minutes Intravenous  Once 05/19/15 1639 05/19/15 1807      Medications: Scheduled Meds: . antiseptic oral rinse  7 mL Mouth Rinse q12n4p  . calcium carbonate  1 tablet Oral TID WC  . chlorhexidine  15 mL Mouth Rinse BID  . ertapenem  1 g Intravenous Q24H  . potassium chloride  40 mEq Oral Q4H  . rivaroxaban  20 mg Oral Q supper  . sodium chloride  3 mL  Intravenous Q12H   Continuous Infusions:  PRN Meds:.acetaminophen (TYLENOL) oral liquid 160 mg/5 mL, cyclobenzaprine, morphine injection, ondansetron **OR** ondansetron (ZOFRAN) IV, oxyCODONE    Objective: Weight change:   Intake/Output Summary (Last 24 hours) at 05/22/15 1622 Last data filed at 05/22/15 1420  Gross per 24 hour  Intake 1701.25 ml  Output   1853 ml  Net -151.75 ml   Blood pressure 127/67, pulse 79, temperature 97.9 F (36.6 C), temperature source Oral, resp. rate 16, height 5\' 3"  (1.6 m), weight 135 lb (61.236 kg), SpO2 100 %. Temp:  [97.9 F (36.6 C)-98.2 F (36.8 C)] 97.9 F (36.6 C) (11/02 1425) Pulse Rate:  [77-89] 79 (11/02 1425) Resp:  [16] 16 (11/02 1425) BP: (118-131)/(67-70) 127/67 mmHg (11/02 1425) SpO2:  [98 %-100 %] 100 % (11/02 1425)  Physical Exam: General: Alert and awake, oriented x3, not in any acute distress.  HEENT: EOMI, oropharynx clear and without exudate  Cardiovascular: regular rate, normal r, no murmur rubs or gallops  Pulmonary: clear to auscultation bilaterally, no wheezing, rales or rhonchi  Gastrointestinal: soft nontender, nondistended, normal bowel sounds, his conduit bag Is with clear urine, his wound is dressed and clean  from outside of bandage  Musculoskeletal: no clubbing or edema noted bilaterally  Skin, soft tissue: no rashes  Neuro: nonfocal, strength and sensation intact   CBC:  CBC Latest Ref Rng 05/22/2015 05/21/2015 05/20/2015  WBC 4.0 - 10.5 K/uL 12.1(H) 12.6(H) 27.7(H)  Hemoglobin 13.0 - 17.0 g/dL 7.9(L) 8.0(L) 8.6(L)  Hematocrit 39.0 - 52.0 % 25.5(L) 25.5(L) 28.5(L)  Platelets 150 - 400 K/uL 381 344 409(H)       BMET  Recent Labs  05/21/15 0335 05/22/15 0427  NA 132* 132*  K 3.9 3.0*  CL 104 103  CO2 22 22  GLUCOSE 95 95  BUN 7 <5*  CREATININE 0.73 0.63  CALCIUM 7.8* 8.0*     Liver Panel   Recent Labs  05/21/15 0335 05/22/15 0427  PROT 5.6* 5.8*  ALBUMIN 2.3* 2.2*  AST 38 23  ALT  29 23  ALKPHOS 427* 385*  BILITOT 0.8 0.6       Sedimentation Rate No results for input(s): ESRSEDRATE in the last 72 hours. C-Reactive Protein No results for input(s): CRP in the last 72 hours.  Micro Results: Recent Results (from the past 720 hour(s))  Urine culture     Status: None   Collection Time: 05/19/15  2:30 PM  Result Value Ref Range Status   Specimen Description URINE, CATHETERIZED  Final   Special Requests NONE  Final   Culture   Final    >=100,000 COLONIES/mL ESCHERICHIA COLI Confirmed Extended Spectrum Beta-Lactamase Producer (ESBL) Performed at [facility]    Report Status 05/22/2015 FINAL  Final   Organism ID, Bacteria ESCHERICHIA COLI  Final      Susceptibility   Escherichia coli - MIC*    AMPICILLIN >=32 RESISTANT Resistant     CEFAZOLIN >=64 RESISTANT Resistant     CEFTRIAXONE >=64 RESISTANT Resistant     CIPROFLOXACIN >=4 RESISTANT Resistant     GENTAMICIN <=1 SENSITIVE Sensitive     IMIPENEM <=0.25 SENSITIVE Sensitive     NITROFURANTOIN <=16 SENSITIVE Sensitive     TRIMETH/SULFA >=320 RESISTANT Resistant     AMPICILLIN/SULBACTAM 16 INTERMEDIATE Intermediate     PIP/TAZO <=4 SENSITIVE Sensitive     * >=100,000 COLONIES/mL ESCHERICHIA COLI  Culture, blood (routine x 2)     Status: None (Preliminary result)   Collection Time: 05/19/15  5:00 PM  Result Value Ref Range Status   Specimen Description BLOOD LEFT HAND  Final   Special Requests BOTTLES DRAWN AEROBIC ONLY 5CC  Final   Culture  Setup Time   Final    GRAM NEGATIVE RODS AEROBIC BOTTLE ONLY CRITICAL RESULT CALLED TO, READ BACK BY AND VERIFIED WITH: C. HOEFLER,RN AT [place] ON [id number] BY Rhea Bleacher    Culture   Final    GRAM NEGATIVE RODS CULTURE REINCUBATED FOR BETTER GROWTH Performed at [facility]    Report Status PENDING  Incomplete  Culture, blood (routine x 2)     Status: None (Preliminary result)   Collection Time: 05/19/15  5:02 PM  Result Value Ref Range Status    Specimen Description BLOOD BLOOD LEFT FOREARM  Final   Special Requests BOTTLES DRAWN AEROBIC AND ANAEROBIC 5 CC  Final   Culture  Setup Time   Final    GRAM NEGATIVE RODS IN BOTH AEROBIC AND ANAEROBIC BOTTLES CRITICAL RESULT CALLED TO, READ BACK BY AND VERIFIED WITH: C. HOEFLER,RN AT [place] ON [id number] BY Rhea Bleacher    Culture   Final    GRAM NEGATIVE RODS  CULTURE REINCUBATED FOR BETTER GROWTH Performed at [facility]    Report Status PENDING  Incomplete  MRSA PCR Screening     Status: None   Collection Time: 05/19/15  8:00 PM  Result Value Ref Range Status   MRSA by PCR NEGATIVE NEGATIVE Final    Comment:        The GeneXpert MRSA Assay (FDA approved for NASAL specimens only), is one component of a comprehensive MRSA colonization surveillance program. It is not intended to diagnose MRSA infection nor to guide or monitor treatment for MRSA infections.   Culture, blood (routine x 2)     Status: None (Preliminary result)   Collection Time: 05/21/15  4:25 PM  Result Value Ref Range Status   Specimen Description BLOOD LEFT HAND  Final   Special Requests BOTTLES DRAWN AEROBIC AND ANAEROBIC 5CC  Final   Culture   Final    NO GROWTH < 24 HOURS Performed at [facility]    Report Status PENDING  Incomplete  Culture, blood (routine x 2)     Status: None (Preliminary result)   Collection Time: 05/21/15  4:40 PM  Result Value Ref Range Status   Specimen Description BLOOD LEFT HAND  Final   Special Requests BOTTLES DRAWN AEROBIC AND ANAEROBIC 5CC  Final   Culture   Final    NO GROWTH < 24 HOURS Performed at [facility]    Report Status PENDING  Incomplete    Studies/Results: No results found.    Assessment/Plan:  INTERVAL HISTORY:   05/21/15: ESBL growing from urine   Principal Problem:   Sepsis ([place]) Active Problems:   Bladder cancer ([place])   Syncope and collapse   Aspiration pneumonia (H[facility])   DVT (deep venous thrombosis) (HCC)    Protein-calorie malnutrition, severe   Aspiration pneumonia due to vomit ([place])   Acute deep vein thrombosis (DVT) of right upper extremity ([place])   UTI (lower urinary tract infection)   Bacteremia   Septic shock (H[facility])    Howard Valentine is a 59 y.o. male with  hx prostate cancer sp robotic-assisted laparoscopic radical prostatectom in 02/08/2013, more recently diagnosed with high grade urothelial carcinoma of bladder sp cystoscopy and radical cystectomy With complications including wound dehiscence requiring ex-lap, DVT no admitted with gram negative septic shock and syncope  #1 Gram negative sepsis: Given that urine is also growing GNR it may indeed be the source though we do not have ID or sensis from blood and urine. Urine is growing ESBL   His CT scan shows consolidation c/w possible PNA though gram negatives are not as common culprits for PNA. He also has some GB distention raising question of biliary source  --repeat blood cultures to ensure clearance taken --change to IV invanz 1 gram daily   #2 Screening: HIV negative and HCV pending   LOS: 3 days   Alcide Evener 05/22/2015, 4:22 PM

## 2015-05-22 NOTE — Progress Notes (Signed)
ANTIBIOTIC CONSULT NOTE - FOLLOW-UP  Pharmacy Consult for Vancomycin, Zosyn Indication: rule out sepsis  No Known Allergies  Patient Measurements: Height: 5\' 3"  (160 cm) Weight: 135 lb (61.236 kg) IBW/kg (Calculated) : 56.9 Adjusted Body Weight:   Vital Signs: Temp: 98 F (36.7 C) (11/02 0506) Temp Source: Oral (11/02 0506) BP: 118/67 mmHg (11/02 0506) Pulse Rate: 77 (11/02 0506) Intake/Output from previous day: 11/01 0701 - 11/02 0700 In: 1351.3 [P.O.:120; I.V.:1081.3; IV Piggyback:150] Out: 5885 [OYDXA:1287; Stool:2] Intake/Output from this shift: Total I/O In: 240 [P.O.:240] Out: 1 [Stool:1]  Labs:  Recent Labs  05/20/15 0410 05/21/15 0335 05/22/15 0427  WBC 27.7* 12.6* 12.1*  HGB 8.6* 8.0* 7.9*  PLT 409* 344 381  CREATININE 0.86 0.73 0.63   Estimated Creatinine Clearance: 79 mL/min (by C-G formula based on Cr of 0.63). No results for input(s): VANCOTROUGH, VANCOPEAK, VANCORANDOM, GENTTROUGH, GENTPEAK, GENTRANDOM, TOBRATROUGH, TOBRAPEAK, TOBRARND, AMIKACINPEAK, AMIKACINTROU, AMIKACIN in the last 72 hours.   Microbiology: Recent Results (from the past 720 hour(s))  Urine culture     Status: None (Preliminary result)   Collection Time: 05/19/15  2:30 PM  Result Value Ref Range Status   Specimen Description URINE, CATHETERIZED  Final   Special Requests NONE  Final   Culture   Final    >=100,000 COLONIES/mL GRAM NEGATIVE RODS Performed at [facility]    Report Status PENDING  Incomplete  Culture, blood (routine x 2)     Status: None (Preliminary result)   Collection Time: 05/19/15  5:00 PM  Result Value Ref Range Status   Specimen Description BLOOD LEFT HAND  Final   Special Requests BOTTLES DRAWN AEROBIC ONLY 5CC  Final   Culture  Setup Time   Final    GRAM NEGATIVE RODS AEROBIC BOTTLE ONLY CRITICAL RESULT CALLED TO, READ BACK BY AND VERIFIED WITH: C. HOEFLER,RN AT [id number] ON [id number] BY Rhea Bleacher    Culture   Final    GRAM NEGATIVE  RODS Performed at [facility]    Report Status PENDING  Incomplete  Culture, blood (routine x 2)     Status: None (Preliminary result)   Collection Time: 05/19/15  5:02 PM  Result Value Ref Range Status   Specimen Description BLOOD BLOOD LEFT FOREARM  Final   Special Requests BOTTLES DRAWN AEROBIC AND ANAEROBIC 5 CC  Final   Culture  Setup Time   Final    GRAM NEGATIVE RODS IN BOTH AEROBIC AND ANAEROBIC BOTTLES CRITICAL RESULT CALLED TO, READ BACK BY AND VERIFIED WITH: C. HOEFLER,RN AT 0962 ON [id number] BY Rhea Bleacher    Culture   Final    GRAM NEGATIVE RODS Performed at [facility]    Report Status PENDING  Incomplete  MRSA PCR Screening     Status: None   Collection Time: 05/19/15  8:00 PM  Result Value Ref Range Status   MRSA by PCR NEGATIVE NEGATIVE Final    Comment:        The GeneXpert MRSA Assay (FDA approved for NASAL specimens only), is one component of a comprehensive MRSA colonization surveillance program. It is not intended to diagnose MRSA infection nor to guide or monitor treatment for MRSA infections.     Medical History: Past Medical History  Diagnosis Date  . Arthritis   . Hematuria   . Lower urinary tract symptoms (LUTS)   . Prostate cancer [facility]) urology--  dr herrick/ dr Valere Dross    DX 2014 ---  PSA 15.2,  Gleason  6/7,  vol 28cc,  s/p radical prostatectomy and external beam radiation therapy--  recurrent , PSA 0.04 (12-28-2014)  . Nocturia   . Urinary frequency      Assessment: 14 yoM with bladder cancer s/p radical cystectomy on 0/78/675 with complication with wound dehiscence which required repeat ex lap, closure of fascial dehiscence and colostomy revision. Hospitalization also complicated by DVT associated with PICC line and pt treated with Xarelto and was discharged on 10/1.  Presents today after episode of weakness, emesis, loss of consciousness witnessed by wife.  Ileostomy bag draining dark amber urine.  Has not started chemo  yet.  Pt cachectic, frail and pale per ED notes.   10/30 >> Vanc >> 11/1 10/30 >> Zosyn >>  Renal: SCr 0.63, CrCl 79 ml/min 10/30 10/30 MRSA PCR: neg  10/30 blood: 2/2 GNR  10/30 urine: >10,000 GNR  Multidrug resistant E. Coli, confirmed ESBL.      Goal of Therapy:  Appropriate antibiotic use Eradication of infection  Plan:  Per discussion with ID, transition to ertapenem 1 gr IV q24h.  Royetta Asal, PharmD, BCPS Pager [phone] 05/22/15 11:15

## 2015-05-22 NOTE — Evaluation (Signed)
Physical Therapy Evaluation Patient Details Name: Howard Valentine MRN: [id number] DOB: May 28, 1956 Today's Date: 05/22/2015   History of Present Illness  59 yo male admitted with sepsis, syncope. Hx of prostate cancer, bladder cancer.   Clinical Impression  On eval, pt required Min guard assist for mobility-walked ~500 feet while holding onto IV pole. Pt tolerated distance well. No c/o dizziness during session.     Follow Up Recommendations No PT follow up;Supervision for mobility/OOB    Equipment Recommendations  None recommended by PT    Recommendations for Other Services       Precautions / Restrictions Precautions Precautions: Fall Restrictions Weight Bearing Restrictions: No      Mobility  Bed Mobility Overal bed mobility: Modified Independent                Transfers Overall transfer level: Modified independent                  Ambulation/Gait Ambulation/Gait assistance: Min guard Ambulation Distance (Feet): 500 Feet Assistive device:  (IV pole) Gait Pattern/deviations: Step-through pattern     General Gait Details: close guard for safety.   Stairs            Wheelchair Mobility    Modified Rankin (Stroke Patients Only)       Balance                                             Pertinent Vitals/Pain Pain Assessment: 0-10 Pain Score: 4  Pain Location: abdomen Pain Descriptors / Indicators: Sore Pain Intervention(s): Monitored during session    Home Living Family/patient expects to be discharged to:: Private residence Living Arrangements: Spouse/significant other Available Help at Discharge: Family Type of Home: House Home Access: Stairs to enter   [employer] of Steps: 1 threshold step Home Layout: Two level;Able to live on main level with bedroom/bathroom Home Equipment: Other (comment) (walking stick)      Prior Function Level of Independence: Independent               Hand  Dominance        Extremity/Trunk Assessment   Upper Extremity Assessment: Overall WFL for tasks assessed           Lower Extremity Assessment: Generalized weakness      Cervical / Trunk Assessment: Normal  Communication   Communication: No difficulties  Cognition Arousal/Alertness: Awake/alert Behavior During Therapy: WFL for tasks assessed/performed Overall Cognitive Status: Within Functional Limits for tasks assessed                      General Comments      Exercises        Assessment/Plan    PT Assessment Patient needs continued PT services  PT Diagnosis Difficulty walking;Generalized weakness   PT Problem List Decreased mobility;Decreased activity tolerance;Decreased strength  PT Treatment Interventions Gait training;Functional mobility training;Therapeutic activities;Patient/family education;Therapeutic exercise   PT Goals (Current goals can be found in the Care Plan section) Acute Rehab PT Goals Patient Stated Goal: home soon PT Goal Formulation: With patient Time For Goal Achievement: 06/05/15 Potential to Achieve Goals: Good    Frequency Min 3X/week   Barriers to discharge        Co-evaluation               End of Session   Activity Tolerance:  Patient tolerated treatment well Patient left: in bed;with call bell/phone within reach;with bed alarm set           Time: 1541-1600 PT Time Calculation (min) (ACUTE ONLY): 19 min   Charges:   PT Evaluation $Initial PT Evaluation Tier I: 1 Procedure     PT G Codes:        Weston Anna, MPT Pager: [phone]

## 2015-05-22 NOTE — Progress Notes (Signed)
Patient states he is feeling better.  He has been afebrile with stable vital signs. Continues to be seen and followed by physical therapy, wound care, and nutrition. Is also seen by infectious disease, he may need  IV antibiotics and treated his infection which is presumably from his urinary tract. Otherwise, the patient states that he feels better.   Filed Vitals:   05/21/15 1349 05/21/15 2136 05/22/15 0506 05/22/15 1425  BP: 147/70 131/70 118/67 127/67  Pulse: 79 89 77 79  Temp: 98 F (36.7 C) 98.2 F (36.8 C) 98 F (36.7 C) 97.9 F (36.6 C)  TempSrc: Oral Oral Oral Oral  Resp: 18 16 16 16   Height:      Weight:      SpO2: 98% 98% 100% 100%    Intake/Output Summary (Last 24 hours) at 05/22/15 1646 Last data filed at 05/22/15 1420  Gross per 24 hour  Intake 1701.25 ml  Output   1853 ml  Net -151.75 ml  NAD Nonlabored breathing Regular heart rate Abdomen is soft Patient's midline wound is healing. Below the open area the erythema is improving, there is an area that is pointing, and draining serous fluid.   Recent Labs  05/20/15 0410 05/21/15 0335 05/22/15 0427  WBC 27.7* 12.6* 12.1*  HGB 8.6* 8.0* 7.9*  HCT 28.5* 25.5* 25.5*    Recent Labs  05/20/15 0410 05/21/15 0335 05/22/15 0427  NA 135 132* 132*  K 4.2 3.9 3.0*  CL 103 104 103  CO2 23 22 22   GLUCOSE 108* 95 95  BUN 7 7 <5*  CREATININE 0.86 0.73 0.63  CALCIUM 8.3* 7.8* 8.0*   No results for input(s): LABPT, INR in the last 72 hours. No results for input(s): PSA in the last 72 hours. No results for input(s): LABURIN in the last 72 hours. Results for orders placed or performed during the hospital encounter of 05/19/15  Urine culture     Status: None   Collection Time: 05/19/15  2:30 PM  Result Value Ref Range Status   Specimen Description URINE, CATHETERIZED  Final   Special Requests NONE  Final   Culture   Final    >=100,000 COLONIES/mL ESCHERICHIA COLI Confirmed Extended Spectrum Beta-Lactamase  Producer (ESBL) Performed at [facility]    Report Status 05/22/2015 FINAL  Final   Organism ID, Bacteria ESCHERICHIA COLI  Final      Susceptibility   Escherichia coli - MIC*    AMPICILLIN >=32 RESISTANT Resistant     CEFAZOLIN >=64 RESISTANT Resistant     CEFTRIAXONE >=64 RESISTANT Resistant     CIPROFLOXACIN >=4 RESISTANT Resistant     GENTAMICIN <=1 SENSITIVE Sensitive     IMIPENEM <=0.25 SENSITIVE Sensitive     NITROFURANTOIN <=16 SENSITIVE Sensitive     TRIMETH/SULFA >=320 RESISTANT Resistant     AMPICILLIN/SULBACTAM 16 INTERMEDIATE Intermediate     PIP/TAZO <=4 SENSITIVE Sensitive     * >=100,000 COLONIES/mL ESCHERICHIA COLI  Culture, blood (routine x 2)     Status: None (Preliminary result)   Collection Time: 05/19/15  5:00 PM  Result Value Ref Range Status   Specimen Description BLOOD LEFT HAND  Final   Special Requests BOTTLES DRAWN AEROBIC ONLY 5CC  Final   Culture  Setup Time   Final    GRAM NEGATIVE RODS AEROBIC BOTTLE ONLY CRITICAL RESULT CALLED TO, READ BACK BY AND VERIFIED WITH: C. HOEFLER,RN AT [place] ON [id number] BY Rhea Bleacher    Culture   Final  GRAM NEGATIVE RODS CULTURE REINCUBATED FOR BETTER GROWTH Performed at [facility]    Report Status PENDING  Incomplete  Culture, blood (routine x 2)     Status: None (Preliminary result)   Collection Time: 05/19/15  5:02 PM  Result Value Ref Range Status   Specimen Description BLOOD BLOOD LEFT FOREARM  Final   Special Requests BOTTLES DRAWN AEROBIC AND ANAEROBIC 5 CC  Final   Culture  Setup Time   Final    GRAM NEGATIVE RODS IN BOTH AEROBIC AND ANAEROBIC BOTTLES CRITICAL RESULT CALLED TO, READ BACK BY AND VERIFIED WITH: C. HOEFLER,RN AT 8341 ON [id number] BY Rhea Bleacher    Culture   Final    GRAM NEGATIVE RODS CULTURE REINCUBATED FOR BETTER GROWTH Performed at [facility]    Report Status PENDING  Incomplete  MRSA PCR Screening     Status: None   Collection Time: 05/19/15  8:00 PM   Result Value Ref Range Status   MRSA by PCR NEGATIVE NEGATIVE Final    Comment:        The GeneXpert MRSA Assay (FDA approved for NASAL specimens only), is one component of a comprehensive MRSA colonization surveillance program. It is not intended to diagnose MRSA infection nor to guide or monitor treatment for MRSA infections.   Culture, blood (routine x 2)     Status: None (Preliminary result)   Collection Time: 05/21/15  4:25 PM  Result Value Ref Range Status   Specimen Description BLOOD LEFT HAND  Final   Special Requests BOTTLES DRAWN AEROBIC AND ANAEROBIC 5CC  Final   Culture   Final    NO GROWTH < 24 HOURS Performed at [facility]    Report Status PENDING  Incomplete  Culture, blood (routine x 2)     Status: None (Preliminary result)   Collection Time: 05/21/15  4:40 PM  Result Value Ref Range Status   Specimen Description BLOOD LEFT HAND  Final   Special Requests BOTTLES DRAWN AEROBIC AND ANAEROBIC 5CC  Final   Culture   Final    NO GROWTH < 24 HOURS Performed at [facility]    Report Status PENDING  Incomplete     Imp: Extended beta lactamase Escherichia coli bacteremia from unclear source, likely from an unrecognized but atypical Pyelonephritis.   He is hemodynamically stable and appears to be improving on all fronts. Upper extremity DVT Lower midline wound  Rec: Likely will require IV antibiotics given his extended beta-lactamase Escherichia coli infection.  I really appreciate all the help in taking care of this unfortunate patient.  I will continue to follow along and assist where I am able.

## 2015-05-23 ENCOUNTER — Ambulatory Visit: Payer: Self-pay | Admitting: Oncology

## 2015-05-23 DIAGNOSIS — Z2239 Carrier of other specified bacterial diseases: Secondary | ICD-10-CM | POA: Insufficient documentation

## 2015-05-23 DIAGNOSIS — A280 Pasteurellosis: Secondary | ICD-10-CM | POA: Diagnosis present

## 2015-05-23 DIAGNOSIS — A4189 Other specified sepsis: Secondary | ICD-10-CM

## 2015-05-23 DIAGNOSIS — R8271 Bacteriuria: Secondary | ICD-10-CM | POA: Insufficient documentation

## 2015-05-23 DIAGNOSIS — A4151 Sepsis due to Escherichia coli [E. coli]: Secondary | ICD-10-CM

## 2015-05-23 LAB — HCV COMMENT:

## 2015-05-23 LAB — CBC
HEMATOCRIT: 26.3 % — AB (ref 39.0–52.0)
HEMOGLOBIN: 8.1 g/dL — AB (ref 13.0–17.0)
MCH: 26.3 pg (ref 26.0–34.0)
MCHC: 30.8 g/dL (ref 30.0–36.0)
MCV: 85.4 fL (ref 78.0–100.0)
Platelets: 394 10*3/uL (ref 150–400)
RBC: 3.08 MIL/uL — AB (ref 4.22–5.81)
RDW: 15.3 % (ref 11.5–15.5)
WBC: 12.1 10*3/uL — AB (ref 4.0–10.5)

## 2015-05-23 LAB — COMPREHENSIVE METABOLIC PANEL
ALBUMIN: 2.5 g/dL — AB (ref 3.5–5.0)
ALK PHOS: 410 U/L — AB (ref 38–126)
ALT: 21 U/L (ref 17–63)
AST: 22 U/L (ref 15–41)
Anion gap: 6 (ref 5–15)
BILIRUBIN TOTAL: 0.3 mg/dL (ref 0.3–1.2)
CALCIUM: 8.3 mg/dL — AB (ref 8.9–10.3)
CO2: 25 mmol/L (ref 22–32)
CREATININE: 0.54 mg/dL — AB (ref 0.61–1.24)
Chloride: 101 mmol/L (ref 101–111)
GFR calc Af Amer: >60 mL/min (ref >60)
GFR calc non Af Amer: >60 mL/min (ref >60)
GLUCOSE: 104 mg/dL — AB (ref 65–99)
Potassium: 3.8 mmol/L (ref 3.5–5.1)
Sodium: 132 mmol/L — ABNORMAL LOW (ref 135–145)
TOTAL PROTEIN: 5.9 g/dL — AB (ref 6.5–8.1)

## 2015-05-23 LAB — MAGNESIUM: Magnesium: 1.7 mg/dL (ref 1.7–2.4)

## 2015-05-23 LAB — HEPATITIS C ANTIBODY (REFLEX): HCV Ab: <0.1 s/co ratio (ref 0.0–0.9)

## 2015-05-23 MED ORDER — CIPROFLOXACIN HCL 500 MG PO TABS
750.0000 mg | ORAL_TABLET | Freq: Two times a day (BID) | ORAL | Status: DC
  Administered 2015-05-24: 750 mg via ORAL
  Filled 2015-05-23 (×2): qty 1

## 2015-05-23 MED ORDER — ZOLPIDEM TARTRATE 5 MG PO TABS
5.0000 mg | ORAL_TABLET | Freq: Every evening | ORAL | Status: DC | PRN
  Administered 2015-05-23: 5 mg via ORAL
  Filled 2015-05-23: qty 1

## 2015-05-23 MED ORDER — CIPROFLOXACIN 500 MG/5ML (10%) PO SUSR
750.0000 mg | Freq: Two times a day (BID) | ORAL | Status: DC
Start: 2015-05-24 — End: 2015-05-23

## 2015-05-23 MED ORDER — FAMOTIDINE 20 MG PO TABS
20.0000 mg | ORAL_TABLET | Freq: Two times a day (BID) | ORAL | Status: DC
  Administered 2015-05-23 – 2015-05-24 (×3): 20 mg via ORAL
  Filled 2015-05-23 (×3): qty 1

## 2015-05-23 MED ORDER — PANTOPRAZOLE SODIUM 40 MG PO TBEC
40.0000 mg | DELAYED_RELEASE_TABLET | Freq: Every day | ORAL | Status: DC
  Administered 2015-05-23: 40 mg via ORAL
  Filled 2015-05-23: qty 1

## 2015-05-23 MED ORDER — CIPROFLOXACIN 500 MG/5ML (10%) PO SUSR: 750.0000 mg | Freq: Two times a day (BID) | ORAL | Status: DC

## 2015-05-23 NOTE — Progress Notes (Signed)
[place] for Infectious Disease    Subjective:  Feels better, no new complaints   Antibiotics:  Anti-infectives    Start     Dose/Rate Route Frequency Ordered Stop   05/24/15 0800  ciprofloxacin (CIPRO) 500 MG/5ML (10%) suspension 750 mg  Status:  Discontinued     750 mg Oral 2 times daily 05/23/15 1125 05/23/15 1126   05/24/15 0800  ciprofloxacin (CIPRO) 500 MG/5ML (10%) suspension 750 mg     750 mg Oral 2 times daily 05/23/15 1126     05/22/15 1200  ertapenem (INVANZ) 1 g in sodium chloride 0.9 % 50 mL IVPB     1 g 100 mL/hr over 30 Minutes Intravenous Every 24 hours 05/22/15 1056 05/24/15 1159   05/22/15 1100  ertapenem (INVANZ) injection 1 g  Status:  Discontinued     1 g Intramuscular Every 24 hours 05/22/15 1051 05/22/15 1055   05/20/15 1100  vancomycin (VANCOCIN) IVPB 750 mg/150 ml premix  Status:  Discontinued     750 mg 150 mL/hr over 60 Minutes Intravenous Every 12 hours 05/20/15 0957 05/21/15 1131   05/20/15 0500  vancomycin (VANCOCIN) IVPB 750 mg/150 ml premix  Status:  Discontinued     750 mg 150 mL/hr over 60 Minutes Intravenous Every 12 hours 05/19/15 1708 05/19/15 1747   05/19/15 2300  piperacillin-tazobactam (ZOSYN) IVPB 3.375 g  Status:  Discontinued     3.375 g 12.5 mL/hr over 240 Minutes Intravenous 3 times per day 05/19/15 1710 05/22/15 1051   05/19/15 1800  piperacillin-tazobactam (ZOSYN) IVPB 3.375 g     3.375 g 100 mL/hr over 30 Minutes Intravenous  Once 05/19/15 1747 05/19/15 1837   05/19/15 1800  vancomycin (VANCOCIN) IVPB 1000 mg/200 mL premix     1,000 mg 200 mL/hr over 60 Minutes Intravenous  Once 05/19/15 1747 05/19/15 1907   05/19/15 1645  piperacillin-tazobactam (ZOSYN) IVPB 3.375 g     3.375 g 100 mL/hr over 30 Minutes Intravenous  Once 05/19/15 1639 05/19/15 1734   05/19/15 1645  vancomycin (VANCOCIN) IVPB 1000 mg/200 mL premix     1,000 mg 200 mL/hr over 60 Minutes Intravenous  Once 05/19/15 1639 05/19/15 1807      Medications: Scheduled Meds: . antiseptic oral rinse  7 mL Mouth Rinse q12n4p  . calcium carbonate  1 tablet Oral TID WC  . chlorhexidine  15 mL Mouth Rinse BID  . [START ON 05/24/2015] ciprofloxacin  750 mg Oral BID  . ertapenem  1 g Intravenous Q24H  . pantoprazole  40 mg Oral Daily  . rivaroxaban  20 mg Oral Q supper  . sodium chloride  3 mL Intravenous Q12H   Continuous Infusions:  PRN Meds:.acetaminophen (TYLENOL) oral liquid 160 mg/5 mL, cyclobenzaprine, morphine injection, ondansetron **OR** ondansetron (ZOFRAN) IV, oxyCODONE    Objective: Weight change:   Intake/Output Summary (Last 24 hours) at 05/23/15 1127 Last data filed at 05/23/15 0600  Gross per 24 hour  Intake    650 ml  Output   2726 ml  Net  -2076 ml   Blood pressure 132/73, pulse 84, temperature 97.9 F (36.6 C), temperature source Oral, resp. rate 16, height 5\' 3"  (1.6 m), weight 135 lb (61.236 kg), SpO2 92 %. Temp:  [97.9 F (36.6 C)-98.1 F (36.7 C)] 97.9 F (36.6 C) (11/03 0439) Pulse Rate:  [79-86] 84 (11/03 0439) Resp:  [16-20] 16 (11/03 0439) BP: (127-132)/(67-73) 132/73 mmHg (11/03 0439)  SpO2:  [92 %-100 %] 92 % (11/03 0439)  Physical Exam: General: Alert and awake, oriented x3, not in any acute distress.  HEENT: EOMI, oropharynx clear and without exudate  Cardiovascular: regular rate, normal r, no murmur rubs or gallops  Pulmonary: clear to auscultation bilaterally, no wheezing, rales or rhonchi  Gastrointestinal: soft nontender, nondistended, normal bowel sounds, his conduit bag Is with clear urine, his wound is dressed and clean from outside of bandage  Musculoskeletal: no clubbing or edema noted bilaterally  Skin, soft tissue: no rashes  Neuro: nonfocal, strength and sensation intact   CBC:  CBC Latest Ref Rng 05/23/2015 05/22/2015 05/21/2015  WBC 4.0 - 10.5 K/uL 12.1(H) 12.1(H) 12.6(H)  Hemoglobin 13.0 - 17.0 g/dL 8.1(L) 7.9(L) 8.0(L)  Hematocrit 39.0 - 52.0 % 26.3(L) 25.5(L)  25.5(L)  Platelets 150 - 400 K/uL 394 381 344       BMET  Recent Labs  05/22/15 0427 05/23/15 0444  NA 132* 132*  K 3.0* 3.8  CL 103 101  CO2 22 25  GLUCOSE 95 104*  BUN <5* <5*  CREATININE 0.63 0.54*  CALCIUM 8.0* 8.3*     Liver Panel   Recent Labs  05/22/15 0427 05/23/15 0444  PROT 5.8* 5.9*  ALBUMIN 2.2* 2.5*  AST 23 22  ALT 23 21  ALKPHOS 385* 410*  BILITOT 0.6 0.3       Sedimentation Rate No results for input(s): ESRSEDRATE in the last 72 hours. C-Reactive Protein No results for input(s): CRP in the last 72 hours.  Micro Results: Recent Results (from the past 720 hour(s))  Urine culture     Status: None   Collection Time: 05/19/15  2:30 PM  Result Value Ref Range Status   Specimen Description URINE, CATHETERIZED  Final   Special Requests NONE  Final   Culture   Final    >=100,000 COLONIES/mL ESCHERICHIA COLI Confirmed Extended Spectrum Beta-Lactamase Producer (ESBL) Performed at [facility]    Report Status 05/22/2015 FINAL  Final   Organism ID, Bacteria ESCHERICHIA COLI  Final      Susceptibility   Escherichia coli - MIC*    AMPICILLIN >=32 RESISTANT Resistant     CEFAZOLIN >=64 RESISTANT Resistant     CEFTRIAXONE >=64 RESISTANT Resistant     CIPROFLOXACIN >=4 RESISTANT Resistant     GENTAMICIN <=1 SENSITIVE Sensitive     IMIPENEM <=0.25 SENSITIVE Sensitive     NITROFURANTOIN <=16 SENSITIVE Sensitive     TRIMETH/SULFA >=320 RESISTANT Resistant     AMPICILLIN/SULBACTAM 16 INTERMEDIATE Intermediate     PIP/TAZO <=4 SENSITIVE Sensitive     * >=100,000 COLONIES/mL ESCHERICHIA COLI  Culture, blood (routine x 2)     Status: None (Preliminary result)   Collection Time: 05/19/15  5:00 PM  Result Value Ref Range Status   Specimen Description BLOOD LEFT HAND  Final   Special Requests BOTTLES DRAWN AEROBIC ONLY 5CC  Final   Culture  Setup Time   Final    GRAM NEGATIVE RODS AEROBIC BOTTLE ONLY CRITICAL RESULT CALLED TO, READ BACK BY  AND VERIFIED WITH: C. HOEFLER,RN AT [place] [id number] BY S. YARBROUGH    Culture   Final    PASTEURELLA MULTOCIDA Usually susceptible to penicillin and other beta lactam agents,quinolones,macrolides and tetracyclines. Performed at [facility]    Report Status PENDING  Incomplete  Culture, blood (routine x 2)     Status: None (Preliminary result)   Collection Time: 05/19/15  5:02 PM  Result Value  Ref Range Status   Specimen Description BLOOD BLOOD LEFT FOREARM  Final   Special Requests BOTTLES DRAWN AEROBIC AND ANAEROBIC 5 CC  Final   Culture  Setup Time   Final    GRAM NEGATIVE RODS IN BOTH AEROBIC AND ANAEROBIC BOTTLES CRITICAL RESULT CALLED TO, READ BACK BY AND VERIFIED WITH: C. HOEFLER,RN AT 8325 ON [id number] BY S. YARBROUGH    Culture   Final    PASTEURELLA MULTOCIDA Usually susceptible to penicillin and other beta lactam agents,quinolones,macrolides and tetracyclines. Performed at [facility]    Report Status PENDING  Incomplete   Organism ID, Bacteria PASTEURELLA MULTOCIDA  Final      Susceptibility   Pasteurella multocida - MIC*    CEFAZOLIN <=4 SENSITIVE Sensitive     GENTAMICIN 4 SENSITIVE Sensitive     CIPROFLOXACIN <=0.25 SENSITIVE Sensitive     IMIPENEM <=0.25 SENSITIVE Sensitive     TRIMETH/SULFA <=20 SENSITIVE Sensitive     * PASTEURELLA MULTOCIDA  MRSA PCR Screening     Status: None   Collection Time: 05/19/15  8:00 PM  Result Value Ref Range Status   MRSA by PCR NEGATIVE NEGATIVE Final    Comment:        The GeneXpert MRSA Assay (FDA approved for NASAL specimens only), is one component of a comprehensive MRSA colonization surveillance program. It is not intended to diagnose MRSA infection nor to guide or monitor treatment for MRSA infections.   Culture, blood (routine x 2)     Status: None (Preliminary result)   Collection Time: 05/21/15  4:25 PM  Result Value Ref Range Status   Specimen Description BLOOD LEFT HAND  Final   Special  Requests BOTTLES DRAWN AEROBIC AND ANAEROBIC 5CC  Final   Culture   Final    NO GROWTH < 24 HOURS Performed at [facility]    Report Status PENDING  Incomplete  Culture, blood (routine x 2)     Status: None (Preliminary result)   Collection Time: 05/21/15  4:40 PM  Result Value Ref Range Status   Specimen Description BLOOD LEFT HAND  Final   Special Requests BOTTLES DRAWN AEROBIC AND ANAEROBIC 5CC  Final   Culture   Final    NO GROWTH < 24 HOURS Performed at [facility]    Report Status PENDING  Incomplete    Studies/Results: No results found.    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  THIS is the cause of his septic shock  Fine to give him one more dose of INVANZ today  Then tomorrow start BID CIPRO 750mg  for high bioavailability for 7 additional days  #2 ESBL in urine = colonizer and he will have had 5 days of active drug anyway  #3 PNA?: he has had 5 days of antibiotics for this if it is real   #2 Screening: HIV negative and HCV negative  NO NEED FOR PICC LINE   I will sign off  Please call with further questions.   LOS: 4 days   Alcide Evener 05/23/2015, 11:27 AM

## 2015-05-23 NOTE — Plan of Care (Signed)
Problem: Phase II Progression Outcomes Goal: Obtain order to discontinue catheter if appropriate Outcome: Not Applicable Date Met:  78/55/47 Pt has ileal conduit.  Problem: Phase III Progression Outcomes Goal: Voiding independently Outcome: Not Applicable Date Met:  68/91/55 Pt has ileal conduit. Goal: Foley discontinued Outcome: Not Applicable Date Met:  25/36/48 Pt has ileal conduit.

## 2015-05-23 NOTE — Progress Notes (Signed)
Clinically, the patient continues to make good progress.  His appetite is improving.  He is feeling much better, with more "pep". Denies any nausea or vomiting. Complaining of some acid reflux symptoms Continues to move his bowels.   Filed Vitals:   05/22/15 0506 05/22/15 1425 05/22/15 2320 05/23/15 0439  BP: 118/67 127/67 128/72 132/73  Pulse: 77 79 86 84  Temp: 98 F (36.7 C) 97.9 F (36.6 C) 98.1 F (36.7 C) 97.9 F (36.6 C)  TempSrc: Oral Oral Oral Oral  Resp: 16 16 20 16   Height:      Weight:      SpO2: 100% 100% 98% 92%    Intake/Output Summary (Last 24 hours) at 05/23/15 1056 Last data filed at 05/23/15 0600  Gross per 24 hour  Intake    650 ml  Output   2726 ml  Net  -2076 ml  NAD Nonlabored breathing Regular heart rate Abdomen is soft Patient's midline wound is healing. Below the open area the erythema is improving, there is an area that is pointing, and draining serous fluid.   Recent Labs  05/21/15 0335 05/22/15 0427 05/23/15 0444  WBC 12.6* 12.1* 12.1*  HGB 8.0* 7.9* 8.1*  HCT 25.5* 25.5* 26.3*    Recent Labs  05/21/15 0335 05/22/15 0427 05/23/15 0444  NA 132* 132* 132*  K 3.9 3.0* 3.8  CL 104 103 101  CO2 22 22 25   GLUCOSE 95 95 104*  BUN 7 <5* <5*  CREATININE 0.73 0.63 0.54*  CALCIUM 7.8* 8.0* 8.3*   No results for input(s): LABPT, INR in the last 72 hours. No results for input(s): PSA in the last 72 hours. No results for input(s): LABURIN in the last 72 hours. Results for orders placed or performed during the hospital encounter of 05/19/15  Urine culture     Status: None   Collection Time: 05/19/15  2:30 PM  Result Value Ref Range Status   Specimen Description URINE, CATHETERIZED  Final   Special Requests NONE  Final   Culture   Final    >=100,000 COLONIES/mL ESCHERICHIA COLI Confirmed Extended Spectrum Beta-Lactamase Producer (ESBL) Performed at [facility]    Report Status 05/22/2015 FINAL  Final   Organism ID,  Bacteria ESCHERICHIA COLI  Final      Susceptibility   Escherichia coli - MIC*    AMPICILLIN >=32 RESISTANT Resistant     CEFAZOLIN >=64 RESISTANT Resistant     CEFTRIAXONE >=64 RESISTANT Resistant     CIPROFLOXACIN >=4 RESISTANT Resistant     GENTAMICIN <=1 SENSITIVE Sensitive     IMIPENEM <=0.25 SENSITIVE Sensitive     NITROFURANTOIN <=16 SENSITIVE Sensitive     TRIMETH/SULFA >=320 RESISTANT Resistant     AMPICILLIN/SULBACTAM 16 INTERMEDIATE Intermediate     PIP/TAZO <=4 SENSITIVE Sensitive     * >=100,000 COLONIES/mL ESCHERICHIA COLI  Culture, blood (routine x 2)     Status: None (Preliminary result)   Collection Time: 05/19/15  5:00 PM  Result Value Ref Range Status   Specimen Description BLOOD LEFT HAND  Final   Special Requests BOTTLES DRAWN AEROBIC ONLY 5CC  Final   Culture  Setup Time   Final    GRAM NEGATIVE RODS AEROBIC BOTTLE ONLY CRITICAL RESULT CALLED TO, READ BACK BY AND VERIFIED WITH: C. HOEFLER,RN AT [place] ON [id number] BY S. YARBROUGH    Culture   Final    PASTEURELLA MULTOCIDA Usually susceptible to penicillin and other beta lactam agents,quinolones,macrolides and tetracyclines. Performed at  [facility]    Report Status PENDING  Incomplete  Culture, blood (routine x 2)     Status: None (Preliminary result)   Collection Time: 05/19/15  5:02 PM  Result Value Ref Range Status   Specimen Description BLOOD BLOOD LEFT FOREARM  Final   Special Requests BOTTLES DRAWN AEROBIC AND ANAEROBIC 5 CC  Final   Culture  Setup Time   Final    GRAM NEGATIVE RODS IN BOTH AEROBIC AND ANAEROBIC BOTTLES CRITICAL RESULT CALLED TO, READ BACK BY AND VERIFIED WITH: C. HOEFLER,RN AT 2979 ON [id number] BY S. YARBROUGH    Culture   Final    PASTEURELLA MULTOCIDA Usually susceptible to penicillin and other beta lactam agents,quinolones,macrolides and tetracyclines. Performed at [facility]    Report Status PENDING  Incomplete   Organism ID, Bacteria PASTEURELLA MULTOCIDA   Final      Susceptibility   Pasteurella multocida - MIC*    CEFAZOLIN <=4 SENSITIVE Sensitive     GENTAMICIN 4 SENSITIVE Sensitive     CIPROFLOXACIN <=0.25 SENSITIVE Sensitive     IMIPENEM <=0.25 SENSITIVE Sensitive     TRIMETH/SULFA <=20 SENSITIVE Sensitive     * PASTEURELLA MULTOCIDA  MRSA PCR Screening     Status: None   Collection Time: 05/19/15  8:00 PM  Result Value Ref Range Status   MRSA by PCR NEGATIVE NEGATIVE Final    Comment:        The GeneXpert MRSA Assay (FDA approved for NASAL specimens only), is one component of a comprehensive MRSA colonization surveillance program. It is not intended to diagnose MRSA infection nor to guide or monitor treatment for MRSA infections.   Culture, blood (routine x 2)     Status: None (Preliminary result)   Collection Time: 05/21/15  4:25 PM  Result Value Ref Range Status   Specimen Description BLOOD LEFT HAND  Final   Special Requests BOTTLES DRAWN AEROBIC AND ANAEROBIC 5CC  Final   Culture   Final    NO GROWTH < 24 HOURS Performed at [facility]    Report Status PENDING  Incomplete  Culture, blood (routine x 2)     Status: None (Preliminary result)   Collection Time: 05/21/15  4:40 PM  Result Value Ref Range Status   Specimen Description BLOOD LEFT HAND  Final   Special Requests BOTTLES DRAWN AEROBIC AND ANAEROBIC 5CC  Final   Culture   Final    NO GROWTH < 24 HOURS Performed at [facility]    Report Status PENDING  Incomplete     Imp: The patient grew Pasteurella from his blood, this is almost certainly a result of his pneumonia.  I don't think this is from his wounds as they are localized and healing.  His extended beta-lactamase Escherichia coli cultured from the urine is likely clinically insignificant.  Fortunately, the Pasteurella has oral antibiotic options.  Recommendation: Would consider Augmentin as the treatment of choice for his pasteurella septicemia.  I would not treat his urine cultures  because these were collected from his bag, clearly contaminated, and he is otherwise asymptomatic from that standpoint.  He should continue with wound care as he is doing.  Follow-up with me has already been scheduled.  From my perspective, the patient can be discharged on oral antibiotics at the discretion of the primary service.  However, as long as he is in the hospital I will continue to round on him

## 2015-05-23 NOTE — Progress Notes (Signed)
Physical Therapy Treatment Patient Details Name: Howard Valentine MRN: [id number] DOB: January 23, 1956 Today's Date: 05/23/2015    History of Present Illness 59 yo male admitted with sepsis, syncope. Hx of prostate cancer, bladder cancer.     PT Comments    Mobilizing well. Encouraged pt to ambulate in hallways with nursing as able/ tolerated to increase activity level.   Follow Up Recommendations  No PT follow up     Equipment Recommendations  None recommended by PT    Recommendations for Other Services       Precautions / Restrictions Precautions Precautions: None Restrictions Weight Bearing Restrictions: No    Mobility  Bed Mobility Overal bed mobility: Modified Independent                Transfers Overall transfer level: Modified independent                  Ambulation/Gait Ambulation/Gait assistance: Supervision Ambulation Distance (Feet): 1000 Feet Assistive device: None Gait Pattern/deviations: Step-through pattern     General Gait Details: good gait speed. No LOB   Stairs            Wheelchair Mobility    Modified Rankin (Stroke Patients Only)       Balance                                    Cognition Arousal/Alertness: Awake/alert Behavior During Therapy: WFL for tasks assessed/performed Overall Cognitive Status: Within Functional Limits for tasks assessed                      Exercises      General Comments        Pertinent Vitals/Pain Pain Assessment: 0-10 Pain Score: 4  Pain Location: abdomen Pain Descriptors / Indicators: Sore Pain Intervention(s): Monitored during session    Home Living                      Prior Function            PT Goals (current goals can now be found in the care plan section) Progress towards PT goals: Progressing toward goals    Frequency  Min 3X/week    PT Plan Current plan remains appropriate    Co-evaluation             End of  Session Equipment Utilized During Treatment: Gait belt Activity Tolerance: Patient tolerated treatment well Patient left: in chair;with call bell/phone within reach     Time: 1031-1048 PT Time Calculation (min) (ACUTE ONLY): 17 min  Charges:  $Gait Training: 8-22 mins                    G Codes:      Weston Anna, MPT Pager: [phone]

## 2015-05-23 NOTE — Consult Note (Signed)
WOC ostomy follow up Stoma type/location: RLQ Ileal conduit. Connected to bedside drainage  Stomal assessment/size: oval, slightly budded, pink and patent.  Peristomal assessment: Pouch from 10/31 remains intact.  WIfe had brought small belt from home and this appears to be holding system in place. There is no leak. Treatment options for stomal/peristomal skin: Convex pouch and barrier ring Output clear yellow urine Ostomy pouching: 1pc. Convex.  Patient wishes to wait and change tomorrow as he will be discharged tomorrow and everything is intact at this time.   Education provided: Patient knows to save pouch if a leak occurs.  Enrolled patient in [place] Start Discharge program: Yes [place] team will continue to follow.  Extra supplies in room.  Sufficient supplies in room for discharge at this time.  Domenic Moras RN BSN [place] Pager [phone]

## 2015-05-23 NOTE — Care Management Note (Signed)
Case Management Note  Patient Details  Name: Howard Valentine MRN: [id number] Date of Birth: 1955-10-31  Subjective/Objective: Noted po abx. For d/c home.                   Action/Plan:d/c home no needs or orders.   Expected Discharge Date:                  Expected Discharge Plan:  Home/Self Care  In-House Referral:  NA  Discharge planning Services  CM Consult  Post Acute Care Choice:  NA Choice offered to:  NA  DME Arranged:    DME Agency:     HH Arranged:    HH Agency:     Status of Service:  In process, will continue to follow  Medicare Important Message Given:    Date Medicare IM Given:    Medicare IM give by:    Date Additional Medicare IM Given:    Additional Medicare Important Message give by:     If discussed at [place] of Stay Meetings, dates discussed:    Additional Comments:  Dessa Phi, RN 05/23/2015, 3:41 PM

## 2015-05-23 NOTE — Care Management Note (Signed)
Case Management Note  Patient Details  Name: NELS MUNN MRN: [id number] Date of Birth: 1955/09/29  Subjective/Objective:  [facility] rep Kristen following for [facility] wound care. Await HHRN orders.                  Action/Plan:d/c plan home w/HHC.   Expected Discharge Date:                  Expected Discharge Plan:  [place]  In-House Referral:  NA  Discharge planning Services  CM Consult  Post Acute Care Choice:  NA Choice offered to:  Patient  DME Arranged:    DME Agency:     HH Arranged:    [place] Agency:  [place]  Status of Service:  In process, will continue to follow  Medicare Important Message Given:    Date Medicare IM Given:    Medicare IM give by:    Date Additional Medicare IM Given:    Additional Medicare Important Message give by:     If discussed at [place] of Stay Meetings, dates discussed:    Additional Comments:  Dessa Phi, RN 05/23/2015, 3:43 PM

## 2015-05-23 NOTE — Progress Notes (Signed)
IP PROGRESS NOTE  Subjective:   Howard Valentine is known to me from a previous consultation done on 04/30/2015. 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 Pathology showed urothelial carcinoma with plasmacytoid variants. Patient was hospitalized on 05/19/2015 with urosepsis and found to have Escherichia coli bacteremia and possible pneumonia. He is receiving intravenous antibiotics and clinically improving. He was scheduled to see me in consultation today as an outpatient.  His CT scan chest abdomen and pelvis did not show any evidence of metastatic malignancy at this time. He is clinically improving and denies any fevers or chills. His appetite is improving at this time.  Objective:  Vital signs in last 24 hours: Temp:  [97.9 F (36.6 C)-98.1 F (36.7 C)] 97.9 F (36.6 C) (11/03 0439) Pulse Rate:  [79-86] 84 (11/03 0439) Resp:  [16-20] 16 (11/03 0439) BP: (127-132)/(67-73) 132/73 mmHg (11/03 0439) SpO2:  [92 %-100 %] 92 % (11/03 0439) Weight change:  Last BM Date: 05/21/15  Intake/Output from previous day: 11/02 0701 - 11/03 0700 In: 890 [P.O.:840; IV Piggyback:50] Out: 2727 [Urine:2725; Stool:2] ECOG 1 Mouth: mucous membranes moist, pharynx normal without lesions Resp: clear to auscultation bilaterally Cardio: regular rate and rhythm, S1, S2 normal, no murmur, click, rub or gallop GI: soft, non-tender; bowel sounds normal; no masses,  no organomegaly Extremities: extremities normal, atraumatic, no cyanosis or edema  Portacath/PICC-without erythema  Lab Results:  Recent Labs  05/22/15 0427 05/23/15 0444  WBC 12.1* 12.1*  HGB 7.9* 8.1*  HCT 25.5* 26.3*  PLT 381 394    BMET  Recent Labs  05/22/15 0427 05/23/15 0444  NA 132* 132*  K 3.0* 3.8  CL 103 101  CO2 22 25  GLUCOSE 95 104*  BUN <5* <5*  CREATININE 0.63 0.54*  CALCIUM 8.0* 8.3*    Studies/Results:  CLINICAL DATA: Possible sepsis.  Radical cystectomy for urothelial carcinoma 04/01/2015. History of prostate cancer and prostatectomy.  EXAM: CT CHEST, ABDOMEN, AND PELVIS WITH CONTRAST  TECHNIQUE: Multidetector CT imaging of the chest, abdomen and pelvis was performed following the standard protocol during bolus administration of intravenous contrast.  CONTRAST: 134mL OMNIPAQUE IOHEXOL 300 MG/ML SOLN  COMPARISON: Abdominal pelvic CT 04/11/2015.  FINDINGS: Mediastinum/Nodes: There are no enlarged mediastinal, hilar or axillary lymph nodes. The thyroid gland, trachea and esophagus demonstrate no significant findings. The heart size is normal. There is no pericardial effusion. There are no significant vascular findings.  Lungs/Pleura: There is no pleural effusion.As demonstrated on earlier radiographs, there is patchy airspace disease at the left lung base suspicious for pneumonia or aspiration. There is minimal patchy airspace disease in the right lower lobe. At the right lung apex, there is a 4 mm nodule on image 8. No other pulmonary nodules are demonstrated.  Musculoskeletal/Chest wall: No chest wall mass or suspicious osseous findings. There are several lower thoracic compression deformities which appear chronic and unchanged.  CT ABDOMEN AND PELVIS FINDINGS  Hepatobiliary: There is new periportal edema throughout the liver. The gallbladder is distended without wall thickening or calcified stones. There is no biliary dilatation. No suspicious liver lesions are identified. There is a questionable small low-density subcapsular lesion right lobe measuring 6 mm on image 60, not clearly seen previously.  Pancreas: Unremarkable. No pancreatic ductal dilatation or surrounding inflammatory changes.  Spleen: Normal in size without focal abnormality.  Adrenals/Urinary Tract: Both adrenal glands appear normal. The ureteral stents have been removed. Both kidneys demonstrate collecting system  dilatation without evidence of ureteral obstruction on delayed images. There is no evidence of renal mass. There is perinephric soft tissue stranding bilaterally. No obstructing calculi are seen. There are postsurgical changes in pelvis consistent with cystectomy and urinary diversion. Delayed images through the ileal conduit were not obtained.  Stomach/Bowel: There is progressive wall thickening of the distal stomach and duodenum. There is also wall thickening of the ascending and transverse colon. There are increased inflammatory changes throughout the upper mesentery and retroperitoneum. No focal extraluminal fluid collection identified. A small amount of gas superior to the transverse duodenum on image 69 corresponds with diverticulum on the prior study. There is no drainable extraluminal fluid collection. There is no significant residual bowel distension.  Vascular/Lymphatic: There are no enlarged abdominal or pelvic lymph nodes. There is mild aortoiliac atherosclerosis.  Reproductive: Status post prostatectomy. A small amount of free pelvic fluid is present.  Other: There are postsurgical changes within the anterior abdominal wall. There is a small fluid collection within the midline incision. There is some edema in the suprapubic region.  Musculoskeletal: No acute or significant osseous findings. No evidence of osseous metastatic disease.  IMPRESSION: 1. CT confirms the presence of new left lower lobe airspace disease suspicious for pneumonia or aspiration. There is also minimal right lower lobe involvement. 2. New periportal edema in the liver with gallbladder distention, but no biliary dilatation or gallbladder wall thickening. 3. Increased inflammatory changes in the upper abdomen with mesenteric and retroperitoneal edema, wall thickening of the distal stomach, duodenum and transverse colon. A unifying explanation for these findings is not obvious. No significant  residual bowel obstruction or drainable fluid collection demonstrated. 4. Interval ureteral stent removal without evidence of ureteral obstruction. 5. No evidence of metastatic disease.     Medications: I have reviewed the patient's current medications.  Assessment/Plan:  59 year old gentleman with the following issues:  1. Invasive high-grade urothelial carcinoma of the bladder diagnosed in August 2016. He presented with hematuria and found to have a biopsy-proven muscle invasive disease. He underwent a radical cystectomy on 04/01/2015 and the pathology revealed a T3 N1 disease of high-grade urothelial carcinoma with plasmacytoid variant.   CT scan of the chest abdomen and pelvis obtained on 05/19/2015 was reviewed and showed no evidence of metastatic disease. The plan at this point, to consider adjuvant chemotherapy once he is clinically stable and his infection have resolved.  Is paramount that he improves his nutritional status and certainly his bacteremia has to resolve before any consideration of adjuvant systemic chemotherapy be given.  I will arrange a follow-up for him upon his discharge to discuss this in the near future.  2. Urosepsis: Currently on intravenous antibiotics and seems to be clinically improving.  LOS: 4 days   Tedrick Port 05/23/2015, 8:02 AM

## 2015-05-23 NOTE — Progress Notes (Signed)
PROGRESS NOTE  Howard Valentine ZFP:[id number] DOB: 10/28/1955 DOA: 05/19/2015 PCP: [name], MD  HPI/Recap of past 24 hours:   feeling better, no fever, no cough, no sob, on room air. Denies pain. Want med to help acid reflux and sleep.  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 coli carrier  Sepsis with leukocytosis, fever, sinus tachycardia, lactic acidosis on presentation, on ivf/abx. Improving,off  ivf , with adequate oral intake.  G- bacteremia: infectious disease consulted.  Ct ab/pel no abscess, abdominal wound minimal drainage,  repeat blood culture done on 11/1, infectious disease input appreciated, was on zosyn , changed to invanz from 11/2 per ID recommendation. Blood culture resulted on 11/3 showed  Pasteurella ( he does has cats at home), ID advised to stop iv abx and changed to oral cipro 750mg  bid from 11/4 for additional 7 days.  UTI vs colonization:  Urine culture with ESBL Ecoli, appreciate ID input, abx changed from zosyn to invanz on 11/2, per ID this is possible colonization.   left lower lobe pna: Started on vanc/zosyn to cover HCAP from admission, regular diet thin liquid per swallow eval, (reported chronic history of difficulty swallowing pills) mrsa screen negative, blood culture g-rods, d/c vanc, continued zosyn till 11/2, then invanz ,then oral cipro as described  above.   Syncope: resolved, likely from sepsis. Echocardiogram adequate LVEF, no wall motion abnormality, grade 1 diastolic dysfunction.  Mild elevation of lft, improving, likely from sepsis.  H/o picc line associated right upper extremity DVT on xarelto.  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 S/p urostomy, ostomy RN and urology consulted. Outpatient oncology follow up to discuss adjuvant chemo.  H/o prostate cancer s/p robotic assisted laparoscopic radical prostatectomy in 01/2013.  Severe protein calorie malnutrition: from malignancy. Nutrition consult obtained.  Hypokalemia/Hypomagnesemia: replace k and mag    Code Status: full  Family Communication: patient   Disposition Plan: home when medically stable, likely tomorrow on 11/4   Consultants:  Urology  Infectious disease  Procedures:  none  Antibiotics:  vanc from admission to 11/1  Zosyn from admission to 11/2  invanz from 11/2, 11/3  Oral cipro from 11/4 for another 7days  per ID recommendation.   Objective: BP 132/73 mmHg  Pulse 84  Temp(Src) 97.9 F (36.6 C) (Oral)  Resp 16  Ht 5\' 3"  (1.6 m)  Wt 135 lb (61.236 kg)  BMI 23.92 kg/m2  SpO2 92%  Intake/Output Summary (Last 24 hours) at 05/23/15 1509 Last data filed at 05/23/15 1200  Gross per 24 hour  Intake    290 ml  Output   2826 ml  Net  -2536 ml   Filed Weights   05/19/15 1642 05/19/15 1705  Weight: 130 lb (58.968 kg) 135 lb (61.236 kg)    Exam:   General:  NAD,   Cardiovascular: RRR  Respiratory: CTABL  Abdomen: Soft/ND/NT, positive BS, urostomy bag with clear urine, abdominal wound with minimal drainage,   Musculoskeletal: No Edema  Neuro: aaox3  Data Reviewed: Basic Metabolic Panel:  Recent Labs Lab 05/19/15 1623 05/20/15 0410 05/21/15 0335 05/22/15 0427 05/23/15 0444  NA 135 135 132* 132* 132*  K 4.4 4.2  3.9 3.0* 3.8  CL 102 103 104 103 101  CO2 25 23 22 22 25   GLUCOSE 114* 108* 95 95 104*  BUN 9 7 7  <5* <5*  CREATININE 0.80 0.86 0.73 0.63 0.54*  CALCIUM 9.0 8.3* 7.8* 8.0* 8.3*  MG  --   --  1.4* 1.7 1.7   Liver Function Tests:  Recent Labs Lab 05/19/15 1623 05/20/15 0410 05/21/15 0335 05/22/15 0427 05/23/15 0444  AST 48* 44* 38 23 22  ALT 44 38 29 23 21   ALKPHOS 779* 613* 427* 385* 410*  BILITOT 0.6 0.8 0.8 0.6 0.3  PROT 7.3 6.8 5.6* 5.8* 5.9*  ALBUMIN 3.3* 2.6* 2.3* 2.2* 2.5*   No results for input(s): LIPASE, AMYLASE in the last 168 hours. No results for input(s): AMMONIA in the last 168 hours. CBC:  Recent Labs Lab 05/19/15 1623 05/20/15 0410 05/21/15 0335 05/22/15 0427 05/23/15 0444  WBC 23.3* 27.7* 12.6* 12.1* 12.1*  NEUTROABS 21.1*  --   --   --   --   HGB 10.3* 8.6* 8.0* 7.9* 8.1*  HCT 32.5* 28.5* 25.5* 25.5* 26.3*  MCV 87.6 88.5 88.5 86.1 85.4  PLT 440* 409* 344 381 394   Cardiac Enzymes:   No results for input(s): CKTOTAL, CKMB, CKMBINDEX, TROPONINI in the last 168 hours. BNP (last 3 results) No results for input(s): BNP in the last 8760 hours.  ProBNP (last 3 results) No results for input(s): PROBNP in the last 8760 hours.  CBG:  Recent Labs Lab 05/19/15 1436  GLUCAP 103*    Recent Results (from the past 240 hour(s))  Urine culture     Status: None   Collection Time: 05/19/15  2:30 PM  Result Value Ref Range Status   Specimen Description URINE, CATHETERIZED  Final   Special Requests NONE  Final   Culture   Final    >=100,000 COLONIES/mL ESCHERICHIA COLI Confirmed Extended Spectrum Beta-Lactamase Producer (ESBL) Performed at [facility]    Report Status 05/22/2015 FINAL  Final   Organism ID, Bacteria ESCHERICHIA COLI  Final      Susceptibility   Escherichia coli - MIC*    AMPICILLIN >=32 RESISTANT Resistant     CEFAZOLIN >=64 RESISTANT Resistant     CEFTRIAXONE >=64 RESISTANT Resistant     CIPROFLOXACIN >=4 RESISTANT  Resistant     GENTAMICIN <=1 SENSITIVE Sensitive     IMIPENEM <=0.25 SENSITIVE Sensitive     NITROFURANTOIN <=16 SENSITIVE Sensitive     TRIMETH/SULFA >=320 RESISTANT Resistant     AMPICILLIN/SULBACTAM 16 INTERMEDIATE Intermediate     PIP/TAZO <=4 SENSITIVE Sensitive     * >=100,000 COLONIES/mL ESCHERICHIA COLI  Culture, blood (routine x 2)     Status: None (Preliminary result)   Collection Time: 05/19/15  5:00 PM  Result Value Ref Range Status   Specimen Description BLOOD LEFT HAND  Final   Special Requests BOTTLES DRAWN AEROBIC ONLY 5CC  Final   Culture  Setup Time   Final    GRAM NEGATIVE RODS AEROBIC BOTTLE ONLY CRITICAL RESULT CALLED TO, READ BACK BY AND VERIFIED WITH: C. HOEFLER,RN AT [place] ON [id number] BY Rhea Bleacher  Culture   Final    PASTEURELLA MULTOCIDA Usually susceptible to penicillin and other beta lactam agents,quinolones,macrolides and tetracyclines. Performed at [facility]    Report Status PENDING  Incomplete  Culture, blood (routine x 2)     Status: None (Preliminary result)   Collection Time: 05/19/15  5:02 PM  Result Value Ref Range Status   Specimen Description BLOOD BLOOD LEFT FOREARM  Final   Special Requests BOTTLES DRAWN AEROBIC AND ANAEROBIC 5 CC  Final   Culture  Setup Time   Final    GRAM NEGATIVE RODS IN BOTH AEROBIC AND ANAEROBIC BOTTLES CRITICAL RESULT CALLED TO, READ BACK BY AND VERIFIED WITH: C. HOEFLER,RN AT 2025 ON [id number] BY S. YARBROUGH    Culture   Final    PASTEURELLA MULTOCIDA Usually susceptible to penicillin and other beta lactam agents,quinolones,macrolides and tetracyclines. Performed at [facility]    Report Status PENDING  Incomplete   Organism ID, Bacteria PASTEURELLA MULTOCIDA  Final      Susceptibility   Pasteurella multocida - MIC*    CEFAZOLIN <=4 SENSITIVE Sensitive     GENTAMICIN 4 SENSITIVE Sensitive     CIPROFLOXACIN <=0.25 SENSITIVE Sensitive     IMIPENEM <=0.25 SENSITIVE Sensitive     TRIMETH/SULFA  <=20 SENSITIVE Sensitive     * PASTEURELLA MULTOCIDA  MRSA PCR Screening     Status: None   Collection Time: 05/19/15  8:00 PM  Result Value Ref Range Status   MRSA by PCR NEGATIVE NEGATIVE Final    Comment:        The GeneXpert MRSA Assay (FDA approved for NASAL specimens only), is one component of a comprehensive MRSA colonization surveillance program. It is not intended to diagnose MRSA infection nor to guide or monitor treatment for MRSA infections.   Culture, blood (routine x 2)     Status: None (Preliminary result)   Collection Time: 05/21/15  4:25 PM  Result Value Ref Range Status   Specimen Description BLOOD LEFT HAND  Final   Special Requests BOTTLES DRAWN AEROBIC AND ANAEROBIC 5CC  Final   Culture   Final    NO GROWTH 2 DAYS Performed at [facility]    Report Status PENDING  Incomplete  Culture, blood (routine x 2)     Status: None (Preliminary result)   Collection Time: 05/21/15  4:40 PM  Result Value Ref Range Status   Specimen Description BLOOD LEFT HAND  Final   Special Requests BOTTLES DRAWN AEROBIC AND ANAEROBIC 5CC  Final   Culture   Final    NO GROWTH 2 DAYS Performed at [facility]    Report Status PENDING  Incomplete     Studies: No results found.  Scheduled Meds: . antiseptic oral rinse  7 mL Mouth Rinse q12n4p  . calcium carbonate  1 tablet Oral TID WC  . chlorhexidine  15 mL Mouth Rinse BID  . [START ON 05/24/2015] ciprofloxacin  750 mg Oral BID  . famotidine  20 mg Oral BID  . rivaroxaban  20 mg Oral Q supper  . sodium chloride  3 mL Intravenous Q12H    Continuous Infusions:     Time spent: 63mins  Rosaland Shiffman MD, PhD  [facility] Pager [phone]. If 7PM-7AM, please contact night-coverage at [email or web address], password [facility] 05/23/2015, 3:09 PM  LOS: 4 days

## 2015-05-23 NOTE — Progress Notes (Signed)
Nutrition Follow-up  DOCUMENTATION CODES:   Severe malnutrition in context of acute illness/injury  INTERVENTION:  - Continue [employer] Instant Breakfast BID - RD will continue to monitor for needs  NUTRITION DIAGNOSIS:   Increased nutrient needs related to catabolic illness, cancer and cancer related treatments as evidenced by estimated needs. -ongoing  GOAL:   Patient will meet greater than or equal to 90% of their needs -meeting in the past 1-2 days  MONITOR:   PO intake, Supplement acceptance, Weight trends, Labs, Skin, I & O's  ASSESSMENT:   59 y.o. male with a past medical history of prostate cancer with pathology showing adenocarcinoma stage T2C N0 undergoing robotic-assisted laparoscopic radical prostatectomy performed on 02/08/2013. He was recently diagnosed with high grade urothelial carcinoma of bladder undergoing cystoscopy procedure performed by Dr. Louis Meckel of urology on 03/01/2015. He subsequently underwent radical cystectomy on 04/01/2015 with pathology showing infiltrative high-grade urothelial carcinoma with plasmacytoid variant, Stage T3a N1. Surgery was complicated by wound dehiscence and required repeat exploratory laparotomy.  11/3 Per chart review, pt ate 100% of breakfast, 50% of lunch, and 75% of dinner yesterday (11/2) with no other intakes documented since initial assessment. Pt reports appetite has been improving. Visualized breakfast tray with 100% completion of blueberry muffin and a cup of grapes. He denies abdominal pain or nausea. He has been having episodes of reflux which he states are not associated with PO intakes and will happen randomly and intermittently.   Pt states possible d/c today or tomorrow and plan to resume chemotherapy after strength has returned.   Pt meeting needs the past 1-2 days. Encouraged ongoing good PO intakes to aid in strength and healing. Medications reviewed. Labs reviewed; Na: 132 mmol/L, BUN <5, creatinine low, Ca: 8.3  mg/dL.    10/31 - He states that for the past 1-2 weeks he has had diarrhea with all PO intakes, including fluids. - He typically needs to use the bathroom ~5 minutes after intakes. He typically did not experience nausea with this.  - He states that he went to a doctor's appointment 2 weeks ago at which time he weighed 138 lbs.  - This indicates 3 lbs weight loss (2% body weight) in 2 weeks which is not significant for time frame.  - Per chart review, current weight is consistent with weight in August 2016 but in September 2016 pt weighed 148 lbs; will need to monitor weight trends. - Moderate muscle and fat wasting noted during physical assessment. No edema.  - Pt was drinking [employer] x1/day PTA and liked this item.  - He has tried Ensure and Boost in the past but did not like the flavor of these items.  - He is interested in receiving [employer] during hospitalization and is open to receiving it BID to increase kcal and protein in his diet.     Diet Order:  Diet regular Room service appropriate?: Yes; Fluid consistency:: Thin  Skin:   mid abdomen surgical wound  Last BM:  11/2  Height:   Ht Readings from Last 1 Encounters:  05/19/15 5\' 3"  (1.6 m)    Weight:   Wt Readings from Last 1 Encounters:  05/19/15 135 lb (61.236 kg)    Ideal Body Weight:  56.36 kg (kg)  BMI:  Body mass index is 23.92 kg/(m^2).  Estimated Nutritional Needs:   Kcal:  1850-2150  Protein:  73-85 grams  Fluid:  2 L/day  EDUCATION NEEDS:   No education needs identified at this  time     Jarome Matin, RD, LDN Inpatient Clinical Dietitian Pager # [phone] After hours/weekend pager # [phone]

## 2015-05-24 LAB — COMPREHENSIVE METABOLIC PANEL
ALBUMIN: 2.6 g/dL — AB (ref 3.5–5.0)
ALT: 21 U/L (ref 17–63)
AST: 23 U/L (ref 15–41)
Alkaline Phosphatase: 432 U/L — ABNORMAL HIGH (ref 38–126)
Anion gap: 11 (ref 5–15)
CHLORIDE: 98 mmol/L — AB (ref 101–111)
CO2: 25 mmol/L (ref 22–32)
Calcium: 8.8 mg/dL — ABNORMAL LOW (ref 8.9–10.3)
Creatinine, Ser: 0.57 mg/dL — ABNORMAL LOW (ref 0.61–1.24)
GFR calc Af Amer: >60 mL/min (ref >60)
GFR calc non Af Amer: >60 mL/min (ref >60)
GLUCOSE: 92 mg/dL (ref 65–99)
POTASSIUM: 3.7 mmol/L (ref 3.5–5.1)
Sodium: 134 mmol/L — ABNORMAL LOW (ref 135–145)
Total Bilirubin: 0.5 mg/dL (ref 0.3–1.2)
Total Protein: 6.4 g/dL — ABNORMAL LOW (ref 6.5–8.1)

## 2015-05-24 LAB — CBC
HCT: 28.7 % — ABNORMAL LOW (ref 39.0–52.0)
Hemoglobin: 9 g/dL — ABNORMAL LOW (ref 13.0–17.0)
MCH: 26.9 pg (ref 26.0–34.0)
MCHC: 31.4 g/dL (ref 30.0–36.0)
MCV: 85.9 fL (ref 78.0–100.0)
PLATELETS: 438 10*3/uL — AB (ref 150–400)
RBC: 3.34 MIL/uL — AB (ref 4.22–5.81)
RDW: 15.6 % — AB (ref 11.5–15.5)
WBC: 12.2 10*3/uL — AB (ref 4.0–10.5)

## 2015-05-24 LAB — MAGNESIUM: Magnesium: 1.6 mg/dL — ABNORMAL LOW (ref 1.7–2.4)

## 2015-05-24 MED ORDER — MAGNESIUM SULFATE 2 GM/50ML IV SOLN
2.0000 g | Freq: Once | INTRAVENOUS | Status: AC
  Administered 2015-05-24: 2 g via INTRAVENOUS
  Filled 2015-05-24: qty 50

## 2015-05-24 MED ORDER — CIPROFLOXACIN HCL 750 MG PO TABS: 750.0000 mg | ORAL_TABLET | Freq: Two times a day (BID) | ORAL | Status: AC

## 2015-05-24 MED ORDER — CHLORPROMAZINE HCL 10 MG PO TABS: 10.0000 mg | ORAL_TABLET | Freq: Three times a day (TID) | ORAL | Status: AC | PRN

## 2015-05-24 NOTE — Consult Note (Signed)
WOC ostomy follow up Stoma type/location: RLQ Ileal Conduit.  Connected to bedside drainage.  Pouch has been in place without leak since 05/20/15.  Changed now.  Stomal assessment/size: oval 1 " x 1.5cm slightly budded pink patent and producing clear yellow urine.  Peristomal assessment: skin is intact.  Close proximity to midline abdominal wound.  Pouch is cut off center and barrier must still be trimmed to avoid this area.   Treatment options for stomal/peristomal skin: Barrier ring, pouch cut off center and pouch barrie trimmed along right side of periphery to accommodate surgical incision.   Output clear yellow urine Ostomy pouching: 1pc.convex with barrier ring.  Patient is to discharge home with [facility] today.  Has 6 pouching systems to take with him.  Education provided: Patient is knowledgeable in self care.  Has pattern to cut off center.  Holds pouch to body to warm and encourage good seal.  Enrolled patient in [place] Start Discharge program: Yes  WOC wound follow up Wound type:mildine abdominal incision.  Dressing changed this AM while ostomy care is being done.  Measurement: 1.4 cm x 0.6 cm x 0.4 cm  Wound PVV:ZSMO pink nongranulating Drainage (amount, consistency, odor) moderate serosanguinous. Periwound:close proximity to ileal conduit Dressing procedure/placement/frequency:Cleanse wound with NS and pat gently dry.  Gently fill wound with alginate and cover with 2x2 gauze.  Secure with tape.   Will not follow at this time.  Please re-consult if needed. Discharging home today.  Domenic Moras RN BSN [place] Pager [phone]

## 2015-05-24 NOTE — Progress Notes (Signed)
Completed D/C teaching with patient and family. Answered all questions. Patient will be D/C home with family in stable condition. 

## 2015-05-24 NOTE — Care Management Note (Signed)
Case Management Note  Patient Details  Name: SAEL FURCHES MRN: [id number] Date of Birth: [birth year]  Subjective/Objective: Noted no HHC orders put in.Confirmed w/Nsg HHC not needed.                   Action/Plan:d/c home no needs or orders.   Expected Discharge Date:                  Expected Discharge Plan:  [place]  In-House Referral:  NA  Discharge planning Services  CM Consult  Post Acute Care Choice:  NA Choice offered to:  Patient  DME Arranged:    DME Agency:     HH Arranged:    [place] Agency:  [place]  Status of Service:  Completed, signed off  Medicare Important Message Given:    Date Medicare IM Given:    Medicare IM give by:    Date Additional Medicare IM Given:    Additional Medicare Important Message give by:     If discussed at [place] of Stay Meetings, dates discussed:    Additional Comments:  Dessa Phi, RN 05/24/2015, 1:07 PM

## 2015-05-24 NOTE — Discharge Summary (Addendum)
Discharge Summary  Howard Valentine YNW:[id number] DOB: Sep 19, 1955  PCP: [name], MD  Admit date: 05/19/2015 Discharge date: 05/24/2015  Time spent: >22mins  Recommendations for Outpatient Follow-up:  1. F/u with PMD within 2weeks for hospital discharge follow up, repeat cbc/cmp at follow up. 2. F/u with urology Dr. Louis Meckel 3. F/u with oncology Dr. Alen Blew  Discharge Diagnoses:  Active Hospital Problems   Diagnosis Date Noted  . 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 Bacteriuria   . ESBL E. coli carrier   . SIRS (systemic inflammatory response syndrome) (HCC)   . ESBL (extended spectrum beta-lactamase) producing bacteria infection   . Gram-negative bacteremia ([place])   . Aspiration pneumonia due to vomit ([place])   . Acute deep vein thrombosis (DVT) of right upper extremity ([place])   . UTI (lower urinary tract infection)   . Bacteremia   . Septic shock ([place])   . Protein-calorie malnutrition, severe 05/20/2015  . Syncope and collapse 05/19/2015  . Sepsis ([place]) 05/19/2015  . Aspiration pneumonia ([place]) 05/19/2015  . DVT (deep venous thrombosis) ([place]) 05/19/2015  . Bladder cancer ([place]) 04/01/2015    Resolved Hospital Problems   Diagnosis Date Noted Date Resolved  No resolved problems to display.    Discharge Condition: stable  Diet recommendation: heart healthy  Filed Weights   05/19/15 1642 05/19/15 1705  Weight: 130 lb (58.968 kg) 135 lb (61.236 kg)    History of present illness:  Howard Valentine is a 59 y.o. male with a past medical history of prostate cancer with pathology showing adenocarcinoma stage T2C N0 undergoing robotic-assisted laparoscopic radical prostatectomy performed on 02/08/2013. He was recently diagnosed with high grade urothelial carcinoma of bladder undergoing cystoscopy procedure performed by Dr. Louis Meckel of urology on 03/01/2015. He subsequently underwent radical cystectomy on 04/01/2015 with pathology showing infiltrative high-grade  urothelial carcinoma with plasmacytoid variant, Stage T3a N1. Surgery was complicated by wound dehiscence and required repeat exploratory laparotomy. This hospitalization was complicated by the development of DVT. He was ultimately discharged from the urology service on 04/20/2015 on cephalexin 500 mg every 6 hours. He was also discharged on Xarelto. He saw Dr.Shadad in the office on 04/30/2015 at which point adjuvant chemotherapy was discussed. Today he was brought into the emergency department by EMS. He was in the car with his wife earlier in the day when he experienced several episodes of nausea and vomiting then passed out for about a minute. He denies chest pain, shortness of breath, palpitations, focal neurological deficits prior to event. His wife call 911 from the car. EMS personnel attempted to get amount of the car when he had a second syncopal event. In the emergency department initial workup revealed a white count of 23,300 with a lactate of 2.17. Vitals showed temperature 101.6 with heart rate of 122. He was started on broad-spectrum IV and microbial therapy with vancomycin and Zosyn.  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 coli carrier  Sepsis with leukocytosis, fever, sinus tachycardia, lactic acidosis on presentation, on ivf/abx. Improving,off ivf , with adequate oral intake.  G- bacteremia  (Pasteurella): infectious disease consulted. Ct ab/pel no abscess, abdominal wound minimal drainage, repeat blood culture done on 11/1, infectious disease input appreciated, was on zosyn , changed to invanz from 11/2 per ID recommendation. Blood culture resulted on 11/3 showed Pasteurella ( he does has cats at home), ID advised to stop iv abx and changed to oral cipro 750mg  bid from 11/4 for additional 7 days.  UTI vs colonization:  Urine culture with ESBL Ecoli, appreciate ID input, abx changed from zosyn to invanz on 11/2, per ID this is possible colonization.   left lower lobe pna: Started on vanc/zosyn to cover HCAP from admission, regular diet thin liquid per swallow eval, (reported chronic history of difficulty swallowing pills) mrsa screen negative, blood culture g-rods, d/c vanc, continued zosyn till 11/2, then invanz ,then oral cipro as described above.   Syncope: resolved, likely from sepsis. Echocardiogram adequate LVEF, no wall motion abnormality, grade 1 diastolic dysfunction.  Mild elevation of lft, improving, likely from sepsis.  H/o picc line associated right upper extremity DVT on xarelto.  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 S/p urostomy, ostomy RN and urology consulted. Seen by oncologist Dr Alen Blew on 11/3 , continue Outpatient oncology follow up to discuss adjuvant chemo.  H/o prostate cancer s/p robotic assisted laparoscopic radical prostatectomy in 01/2013.  Severe protein calorie malnutrition: from malignancy. Nutrition consult obtained. Continue Carn[employer] Instant Breakfast BID  Hypokalemia/Hypomagnesemia: replaced.    Code Status: full  Family Communication: patient   Disposition Plan: home 11/4   Consultants:  Urology Dr. Montine Circle  Infectious disease Dr. Tommy Medal  Oncology Dr. Alen Blew  Procedures:  none  Antibiotics:  vanc from  admission to 11/1  Zosyn from admission to 11/2  invanz from 11/2, 11/3  Oral cipro from 11/4 for another 7days per ID recommendation.  Discharge Exam: BP 131/82 mmHg  Pulse 97  Temp(Src) 97.7 F (36.5 C) (Oral)  Resp 16  Ht 5\' 3"  (1.6 m)  Wt 135 lb (61.236 kg)  BMI 23.92 kg/m2  SpO2 99%   General: NAD,   Cardiovascular: RRR  Respiratory: CTABL  Abdomen: Soft/ND/NT, positive BS, urostomy bag with clear urine, abdominal wound with minimal drainage,   Musculoskeletal: No Edema  Neuro: aaox3   Discharge Instructions You were cared for by a hospitalist during your hospital stay. If you have any questions about your discharge medications or the care you received while you were in the hospital after you are discharged, you can call the unit and asked to speak with the hospitalist on call if the hospitalist that took care of you is not available. Once you are discharged, your primary care physician will handle any further medical issues. Please note that NO REFILLS for any discharge medications will be authorized once you are discharged, as it is imperative that you return to your primary care physician (or establish a relationship with a primary care physician if you do not have one) for your aftercare needs so that they can reassess your need for medications and monitor your lab values.      Discharge Instructions    Diet - low sodium heart healthy    Complete by:  As directed      Face-to-face encounter (required for Medicare/Medicaid patients)  Complete by:  As directed   I Zander Ingham certify that this patient is under my care and that I, or a nurse practitioner or physician's assistant working with me, had a face-to-face encounter that meets the physician face-to-face encounter requirements with this patient on 05/23/2015. The encounter with the patient was in whole, or in part for the following medical condition(s) which is the primary reason for home health care (List  medical condition): wound care  The encounter with the patient was in whole, or in part, for the following medical condition, which is the primary reason for home health care:  wound care  I certify that, based on my findings, the following services are medically necessary home health services:  Nursing  Reason for Medically Necessary Home Health Services:  Skilled Nursing- Change/Decline in Patient Status  My clinical findings support the need for the above services:  OTHER SEE COMMENTS  Further, I certify that my clinical findings support that this patient is homebound due to:  Immunocompromised     Home Health    Complete by:  As directed   To provide the following care/treatments:  RN     Increase activity slowly    Complete by:  As directed             Medication List    STOP taking these medications        docusate 50 MG/5ML liquid  Commonly known as:  COLACE     gabapentin 300 MG capsule  Commonly known as:  NEURONTIN     oxyCODONE 5 MG/5ML solution  Commonly known as:  ROXICODONE      TAKE these medications        calcium carbonate 750 MG chewable tablet  Commonly known as:  TUMS EX  Chew 1 tablet by mouth daily.     chlorproMAZINE 10 MG tablet  Commonly known as:  THORAZINE  Take 1 tablet (10 mg total) by mouth 3 (three) times daily as needed for hiccoughs.     ciprofloxacin 750 MG tablet  Commonly known as:  CIPRO  Take 1 tablet (750 mg total) by mouth 2 (two) times daily.     multivitamin with minerals Tabs tablet  Take 1 tablet by mouth daily.     ondansetron 4 MG/5ML solution  Commonly known as:  ZOFRAN  Take 5 mLs (4 mg total) by mouth once.     polyethylene glycol packet  Commonly known as:  MIRALAX / GLYCOLAX  Take 17 g by mouth daily.     rivaroxaban 20 MG Tabs tablet  Commonly known as:  XARELTO  Take 20 mg by mouth daily.     ZZZQUIL 50 MG/30ML Liqd  Generic drug:  DiphenhydrAMINE HCl  Take 10 mLs by mouth at bedtime as needed (for  sleep).       No Known Allergies Follow-up Information    Follow up with [name], MD On 05/28/2015.   Specialty:  Urology   Why:  8am   Contact information:   [place] [address] [phone]       Follow up with [facility], MD.   Specialty:  Oncology   Why:  discuss chemotherapy   Contact information:   [place]. [place] [address] [phone]       Follow up with [name], MD In 2 weeks.   Specialty:  Family Medicine   Why:  hospital discharge follow up, repeat cbc/cmp at follow up  Contact information:   [address] [place] [address] [phone]        The results of significant diagnostics from this hospitalization (including imaging, microbiology, ancillary and laboratory) are listed below for reference.    Significant Diagnostic Studies: X-ray Chest Pa And Lateral  05/20/2015  CLINICAL DATA:  Dry cough. EXAM: CHEST  2 VIEW COMPARISON:  CT 05/19/2015. FINDINGS: Mediastinum and hilar structures are normal. Persistent left lower lobe infiltrate consistent pneumonia. No prominent pleural effusion or pneumothorax. Heart size normal. Stable multiple thoracic spine compression fractures. IMPRESSION: Persistent left lower lobe infiltrate consistent pneumonia. No definite interim change from prior CT of 05/19/2015 . Electronically Signed   By: Marcello Moores  Register   On: 05/20/2015 09:25   Dg Chest 2 View  05/19/2015  CLINICAL DATA:  Syncopal episode with weakness, nausea and cough. Prostate cancer. Initial encounter. EXAM: CHEST  2 VIEW COMPARISON:  03/12/2015. FINDINGS: The heart size and mediastinal contours are stable. There is new patchy left lower lobe airspace disease suspicious for early pneumonia or aspiration. The right lung is clear. There is no pleural effusion or pneumothorax. Mid thoracic compression deformities are stable. The bones appear mildly demineralized. IMPRESSION: New patchy left basilar  airspace disease suspicious for early pneumonia or aspiration. Electronically Signed   By: Richardean Sale M.D.   On: 05/19/2015 16:54   Ct Chest W Contrast  05/19/2015  CLINICAL DATA:  Possible sepsis. Radical cystectomy for urothelial carcinoma 04/01/2015. History of prostate cancer and prostatectomy. EXAM: CT CHEST, ABDOMEN, AND PELVIS WITH CONTRAST TECHNIQUE: Multidetector CT imaging of the chest, abdomen and pelvis was performed following the standard protocol during bolus administration of intravenous contrast. CONTRAST:  183mL OMNIPAQUE IOHEXOL 300 MG/ML  SOLN COMPARISON:  Abdominal pelvic CT 04/11/2015. FINDINGS: Mediastinum/Nodes: There are no enlarged mediastinal, hilar or axillary lymph nodes. The thyroid gland, trachea and esophagus demonstrate no significant findings. The heart size is normal. There is no pericardial effusion. There are no significant vascular findings. Lungs/Pleura: There is no pleural effusion.As demonstrated on earlier radiographs, there is patchy airspace disease at the left lung base suspicious for pneumonia or aspiration. There is minimal patchy airspace disease in the right lower lobe. At the right lung apex, there is a 4 mm nodule on image 8. No other pulmonary nodules are demonstrated. Musculoskeletal/Chest wall: No chest wall mass or suspicious osseous findings. There are several lower thoracic compression deformities which appear chronic and unchanged. CT ABDOMEN AND PELVIS FINDINGS Hepatobiliary: There is new periportal edema throughout the liver. The gallbladder is distended without wall thickening or calcified stones. There is no biliary dilatation. No suspicious liver lesions are identified. There is a questionable small low-density subcapsular lesion right lobe measuring 6 mm on image 60, not clearly seen previously. Pancreas: Unremarkable. No pancreatic ductal dilatation or surrounding inflammatory changes. Spleen: Normal in size without focal abnormality.  Adrenals/Urinary Tract: Both adrenal glands appear normal. The ureteral stents have been removed. Both kidneys demonstrate collecting system dilatation without evidence of ureteral obstruction on delayed images. There is no evidence of renal mass. There is perinephric soft tissue stranding bilaterally. No obstructing calculi are seen. There are postsurgical changes in pelvis consistent with cystectomy and urinary diversion. Delayed images through the ileal conduit were not obtained. Stomach/Bowel: There is progressive wall thickening of the distal stomach and duodenum. There is also wall thickening of the ascending and transverse colon. There are increased inflammatory changes throughout the upper mesentery and retroperitoneum. No focal extraluminal fluid collection  identified. A small amount of gas superior to the transverse duodenum on image 69 corresponds with diverticulum on the prior study. There is no drainable extraluminal fluid collection. There is no significant residual bowel distension. Vascular/Lymphatic: There are no enlarged abdominal or pelvic lymph nodes. There is mild aortoiliac atherosclerosis. Reproductive: Status post prostatectomy. A small amount of free pelvic fluid is present. Other: There are postsurgical changes within the anterior abdominal wall. There is a small fluid collection within the midline incision. There is some edema in the suprapubic region. Musculoskeletal: No acute or significant osseous findings. No evidence of osseous metastatic disease. IMPRESSION: 1. CT confirms the presence of new left lower lobe airspace disease suspicious for pneumonia or aspiration. There is also minimal right lower lobe involvement. 2. New periportal edema in the liver with gallbladder distention, but no biliary dilatation or gallbladder wall thickening. 3. Increased inflammatory changes in the upper abdomen with mesenteric and retroperitoneal edema, wall thickening of the distal stomach, duodenum and  transverse colon. A unifying explanation for these findings is not obvious. No significant residual bowel obstruction or drainable fluid collection demonstrated. 4. Interval ureteral stent removal without evidence of ureteral obstruction. 5. No evidence of metastatic disease. Electronically Signed   By: Richardean Sale M.D.   On: 05/19/2015 19:29   Ct Abdomen Pelvis W Contrast  05/19/2015  CLINICAL DATA:  Possible sepsis. Radical cystectomy for urothelial carcinoma 04/01/2015. History of prostate cancer and prostatectomy. EXAM: CT CHEST, ABDOMEN, AND PELVIS WITH CONTRAST TECHNIQUE: Multidetector CT imaging of the chest, abdomen and pelvis was performed following the standard protocol during bolus administration of intravenous contrast. CONTRAST:  171mL OMNIPAQUE IOHEXOL 300 MG/ML  SOLN COMPARISON:  Abdominal pelvic CT 04/11/2015. FINDINGS: Mediastinum/Nodes: There are no enlarged mediastinal, hilar or axillary lymph nodes. The thyroid gland, trachea and esophagus demonstrate no significant findings. The heart size is normal. There is no pericardial effusion. There are no significant vascular findings. Lungs/Pleura: There is no pleural effusion.As demonstrated on earlier radiographs, there is patchy airspace disease at the left lung base suspicious for pneumonia or aspiration. There is minimal patchy airspace disease in the right lower lobe. At the right lung apex, there is a 4 mm nodule on image 8. No other pulmonary nodules are demonstrated. Musculoskeletal/Chest wall: No chest wall mass or suspicious osseous findings. There are several lower thoracic compression deformities which appear chronic and unchanged. CT ABDOMEN AND PELVIS FINDINGS Hepatobiliary: There is new periportal edema throughout the liver. The gallbladder is distended without wall thickening or calcified stones. There is no biliary dilatation. No suspicious liver lesions are identified. There is a questionable small low-density subcapsular  lesion right lobe measuring 6 mm on image 60, not clearly seen previously. Pancreas: Unremarkable. No pancreatic ductal dilatation or surrounding inflammatory changes. Spleen: Normal in size without focal abnormality. Adrenals/Urinary Tract: Both adrenal glands appear normal. The ureteral stents have been removed. Both kidneys demonstrate collecting system dilatation without evidence of ureteral obstruction on delayed images. There is no evidence of renal mass. There is perinephric soft tissue stranding bilaterally. No obstructing calculi are seen. There are postsurgical changes in pelvis consistent with cystectomy and urinary diversion. Delayed images through the ileal conduit were not obtained. Stomach/Bowel: There is progressive wall thickening of the distal stomach and duodenum. There is also wall thickening of the ascending and transverse colon. There are increased inflammatory changes throughout the upper mesentery and retroperitoneum. No focal extraluminal fluid collection identified. A small amount of gas superior to the transverse duodenum on  image 69 corresponds with diverticulum on the prior study. There is no drainable extraluminal fluid collection. There is no significant residual bowel distension. Vascular/Lymphatic: There are no enlarged abdominal or pelvic lymph nodes. There is mild aortoiliac atherosclerosis. Reproductive: Status post prostatectomy. A small amount of free pelvic fluid is present. Other: There are postsurgical changes within the anterior abdominal wall. There is a small fluid collection within the midline incision. There is some edema in the suprapubic region. Musculoskeletal: No acute or significant osseous findings. No evidence of osseous metastatic disease. IMPRESSION: 1. CT confirms the presence of new left lower lobe airspace disease suspicious for pneumonia or aspiration. There is also minimal right lower lobe involvement. 2. New periportal edema in the liver with gallbladder  distention, but no biliary dilatation or gallbladder wall thickening. 3. Increased inflammatory changes in the upper abdomen with mesenteric and retroperitoneal edema, wall thickening of the distal stomach, duodenum and transverse colon. A unifying explanation for these findings is not obvious. No significant residual bowel obstruction or drainable fluid collection demonstrated. 4. Interval ureteral stent removal without evidence of ureteral obstruction. 5. No evidence of metastatic disease. Electronically Signed   By: Richardean Sale M.D.   On: 05/19/2015 19:29    Microbiology: Recent Results (from the past 240 hour(s))  Urine culture     Status: None   Collection Time: 05/19/15  2:30 PM  Result Value Ref Range Status   Specimen Description URINE, CATHETERIZED  Final   Special Requests NONE  Final   Culture   Final    >=100,000 COLONIES/mL ESCHERICHIA COLI Confirmed Extended Spectrum Beta-Lactamase Producer (ESBL) Performed at [facility]    Report Status 05/22/2015 FINAL  Final   Organism ID, Bacteria ESCHERICHIA COLI  Final      Susceptibility   Escherichia coli - MIC*    AMPICILLIN >=32 RESISTANT Resistant     CEFAZOLIN >=64 RESISTANT Resistant     CEFTRIAXONE >=64 RESISTANT Resistant     CIPROFLOXACIN >=4 RESISTANT Resistant     GENTAMICIN <=1 SENSITIVE Sensitive     IMIPENEM <=0.25 SENSITIVE Sensitive     NITROFURANTOIN <=16 SENSITIVE Sensitive     TRIMETH/SULFA >=320 RESISTANT Resistant     AMPICILLIN/SULBACTAM 16 INTERMEDIATE Intermediate     PIP/TAZO <=4 SENSITIVE Sensitive     * >=100,000 COLONIES/mL ESCHERICHIA COLI  Culture, blood (routine x 2)     Status: None (Preliminary result)   Collection Time: 05/19/15  5:00 PM  Result Value Ref Range Status   Specimen Description BLOOD LEFT HAND  Final   Special Requests BOTTLES DRAWN AEROBIC ONLY 5CC  Final   Culture  Setup Time   Final    GRAM NEGATIVE RODS AEROBIC BOTTLE ONLY CRITICAL RESULT CALLED TO, READ BACK BY  AND VERIFIED WITH: C. HOEFLER,RN AT [place] [id number] BY S. YARBROUGH    Culture   Final    PASTEURELLA MULTOCIDA Usually susceptible to penicillin and other beta lactam agents,quinolones,macrolides and tetracyclines. CULTURE REINCUBATED FOR BETTER GROWTH Performed at [facility]    Report Status PENDING  Incomplete  Culture, blood (routine x 2)     Status: None (Preliminary result)   Collection Time: 05/19/15  5:02 PM  Result Value Ref Range Status   Specimen Description BLOOD BLOOD LEFT FOREARM  Final   Special Requests BOTTLES DRAWN AEROBIC AND ANAEROBIC 5 CC  Final   Culture  Setup Time   Final    GRAM NEGATIVE RODS IN BOTH AEROBIC AND ANAEROBIC BOTTLES CRITICAL  RESULT CALLED TO, READ BACK BY AND VERIFIED WITH: C. HOEFLER,RN AT 2774 ON [id number] BY S. YARBROUGH    Culture   Final    PASTEURELLA MULTOCIDA Usually susceptible to penicillin and other beta lactam agents,quinolones,macrolides and tetracyclines. CULTURE REINCUBATED FOR BETTER GROWTH Performed at [facility]    Report Status PENDING  Incomplete   Organism ID, Bacteria PASTEURELLA MULTOCIDA  Final      Susceptibility   Pasteurella multocida - MIC*    CEFAZOLIN <=4 SENSITIVE Sensitive     GENTAMICIN 4 SENSITIVE Sensitive     CIPROFLOXACIN <=0.25 SENSITIVE Sensitive     IMIPENEM <=0.25 SENSITIVE Sensitive     TRIMETH/SULFA <=20 SENSITIVE Sensitive     * PASTEURELLA MULTOCIDA  MRSA PCR Screening     Status: None   Collection Time: 05/19/15  8:00 PM  Result Value Ref Range Status   MRSA by PCR NEGATIVE NEGATIVE Final    Comment:        The GeneXpert MRSA Assay (FDA approved for NASAL specimens only), is one component of a comprehensive MRSA colonization surveillance program. It is not intended to diagnose MRSA infection nor to guide or monitor treatment for MRSA infections.   Culture, blood (routine x 2)     Status: None (Preliminary result)   Collection Time: 05/21/15  4:25 PM  Result Value Ref  Range Status   Specimen Description BLOOD LEFT HAND  Final   Special Requests BOTTLES DRAWN AEROBIC AND ANAEROBIC 5CC  Final   Culture   Final    NO GROWTH 2 DAYS Performed at [facility]    Report Status PENDING  Incomplete  Culture, blood (routine x 2)     Status: None (Preliminary result)   Collection Time: 05/21/15  4:40 PM  Result Value Ref Range Status   Specimen Description BLOOD LEFT HAND  Final   Special Requests BOTTLES DRAWN AEROBIC AND ANAEROBIC 5CC  Final   Culture   Final    NO GROWTH 2 DAYS Performed at [facility]    Report Status PENDING  Incomplete     Labs: Basic Metabolic Panel:  Recent Labs Lab 05/20/15 0410 05/21/15 0335 05/22/15 0427 05/23/15 0444 05/24/15 0441  NA 135 132* 132* 132* 134*  K 4.2 3.9 3.0* 3.8 3.7  CL 103 104 103 101 98*  CO2 23 22 22 25 25   GLUCOSE 108* 95 95 104* 92  BUN 7 7 <5* <5* <5*  CREATININE 0.86 0.73 0.63 0.54* 0.57*  CALCIUM 8.3* 7.8* 8.0* 8.3* 8.8*  MG  --  1.4* 1.7 1.7 1.6*   Liver Function Tests:  Recent Labs Lab 05/20/15 0410 05/21/15 0335 05/22/15 0427 05/23/15 0444 05/24/15 0441  AST 44* 38 23 22 23   ALT 38 29 23 21 21   ALKPHOS 613* 427* 385* 410* 432*  BILITOT 0.8 0.8 0.6 0.3 0.5  PROT 6.8 5.6* 5.8* 5.9* 6.4*  ALBUMIN 2.6* 2.3* 2.2* 2.5* 2.6*   No results for input(s): LIPASE, AMYLASE in the last 168 hours. No results for input(s): AMMONIA in the last 168 hours. CBC:  Recent Labs Lab 05/19/15 1623 05/20/15 0410 05/21/15 0335 05/22/15 0427 05/23/15 0444 05/24/15 0441  WBC 23.3* 27.7* 12.6* 12.1* 12.1* 12.2*  NEUTROABS 21.1*  --   --   --   --   --   HGB 10.3* 8.6* 8.0* 7.9* 8.1* 9.0*  HCT 32.5* 28.5* 25.5* 25.5* 26.3* 28.7*  MCV 87.6 88.5 88.5 86.1 85.4 85.9  PLT 440* 409*  344 381 394 438*   Cardiac Enzymes: No results for input(s): CKTOTAL, CKMB, CKMBINDEX, TROPONINI in the last 168 hours. BNP: BNP (last 3 results) No results for input(s): BNP in the last 8760  hours.  ProBNP (last 3 results) No results for input(s): PROBNP in the last 8760 hours.  CBG:  Recent Labs Lab 05/19/15 1436  GLUCAP 103*       Signed:  Anetha Slagel MD, PhD  [facility] 05/24/2015, 2:24 PM

## 2015-05-25 LAB — CULTURE, BLOOD (ROUTINE X 2)

## 2015-05-26 LAB — CULTURE, BLOOD (ROUTINE X 2)
CULTURE: NO GROWTH
Culture: NO GROWTH

## 2015-06-04 ENCOUNTER — Other Ambulatory Visit: Payer: Self-pay | Admitting: Physician Assistant

## 2015-06-04 DIAGNOSIS — R112 Nausea with vomiting, unspecified: Secondary | ICD-10-CM

## 2015-06-07 ENCOUNTER — Ambulatory Visit (HOSPITAL_COMMUNITY)
Admission: RE | Admit: 2015-06-07 | Discharge: 2015-06-07 | Disposition: A | Discharge status: Home / Self Care | Payer: BLUE CROSS/BLUE SHIELD | Source: Ambulatory Visit | Attending: Family Medicine | Admitting: Family Medicine

## 2015-06-07 ENCOUNTER — Other Ambulatory Visit (HOSPITAL_COMMUNITY): Payer: Self-pay | Admitting: Family Medicine

## 2015-06-07 ENCOUNTER — Ambulatory Visit
Admission: RE | Admit: 2015-06-07 | Discharge: 2015-06-07 | Disposition: A | Discharge status: Home / Self Care | Payer: BLUE CROSS/BLUE SHIELD | Source: Ambulatory Visit | Attending: Physician Assistant | Admitting: Physician Assistant

## 2015-06-07 DIAGNOSIS — E46 Unspecified protein-calorie malnutrition: Secondary | ICD-10-CM

## 2015-06-07 DIAGNOSIS — R748 Abnormal levels of other serum enzymes: Secondary | ICD-10-CM | POA: Diagnosis present

## 2015-06-07 DIAGNOSIS — K838 Other specified diseases of biliary tract: Secondary | ICD-10-CM | POA: Diagnosis not present

## 2015-06-07 DIAGNOSIS — R110 Nausea: Secondary | ICD-10-CM | POA: Diagnosis not present

## 2015-06-07 DIAGNOSIS — R935 Abnormal findings on diagnostic imaging of other abdominal regions, including retroperitoneum: Secondary | ICD-10-CM | POA: Insufficient documentation

## 2015-06-07 DIAGNOSIS — K2980 Duodenitis without bleeding: Secondary | ICD-10-CM | POA: Diagnosis not present

## 2015-06-07 DIAGNOSIS — K529 Noninfective gastroenteritis and colitis, unspecified: Secondary | ICD-10-CM | POA: Insufficient documentation

## 2015-06-07 DIAGNOSIS — N2889 Other specified disorders of kidney and ureter: Secondary | ICD-10-CM | POA: Diagnosis not present

## 2015-06-07 DIAGNOSIS — R188 Other ascites: Secondary | ICD-10-CM | POA: Insufficient documentation

## 2015-06-07 DIAGNOSIS — R601 Generalized edema: Secondary | ICD-10-CM | POA: Diagnosis not present

## 2015-06-07 DIAGNOSIS — K2950 Unspecified chronic gastritis without bleeding: Secondary | ICD-10-CM | POA: Diagnosis not present

## 2015-06-07 DIAGNOSIS — C679 Malignant neoplasm of bladder, unspecified: Secondary | ICD-10-CM | POA: Insufficient documentation

## 2015-06-07 DIAGNOSIS — R112 Nausea with vomiting, unspecified: Secondary | ICD-10-CM

## 2015-06-07 MED ORDER — GADOBENATE DIMEGLUMINE 529 MG/ML IV SOLN
12.0000 mL | Freq: Once | INTRAVENOUS | Status: AC | PRN
  Administered 2015-06-07: 12 mL via INTRAVENOUS

## 2015-06-17 ENCOUNTER — Other Ambulatory Visit: Payer: Self-pay | Admitting: Oncology

## 2015-06-18 ENCOUNTER — Telehealth: Payer: Self-pay | Admitting: Oncology

## 2015-06-18 NOTE — Telephone Encounter (Signed)
lvm for pt regardign to DEC appt.... °

## 2015-06-21 ENCOUNTER — Ambulatory Visit: Payer: Self-pay | Admitting: Oncology

## 2015-07-21 DEATH — deceased

## 2016-04-29 IMAGING — CR DG ABDOMEN 2V
2 series · 2 of 2 positions shown · non-contrast
Comparison: 03/12/15

CLINICAL DATA: s/p cystectomy and ileal conduit on 04/01/15
discharged 04/07/15. Had increasing trouble with PO intake over the
last 3 days.Stopped having flatus and BMs yesterday and his last BM
was about 4 days ago prior to discharge.concern for possible bowel
obstruction

EXAM:
ABDOMEN - 2 VIEW

[w abdomen decub]
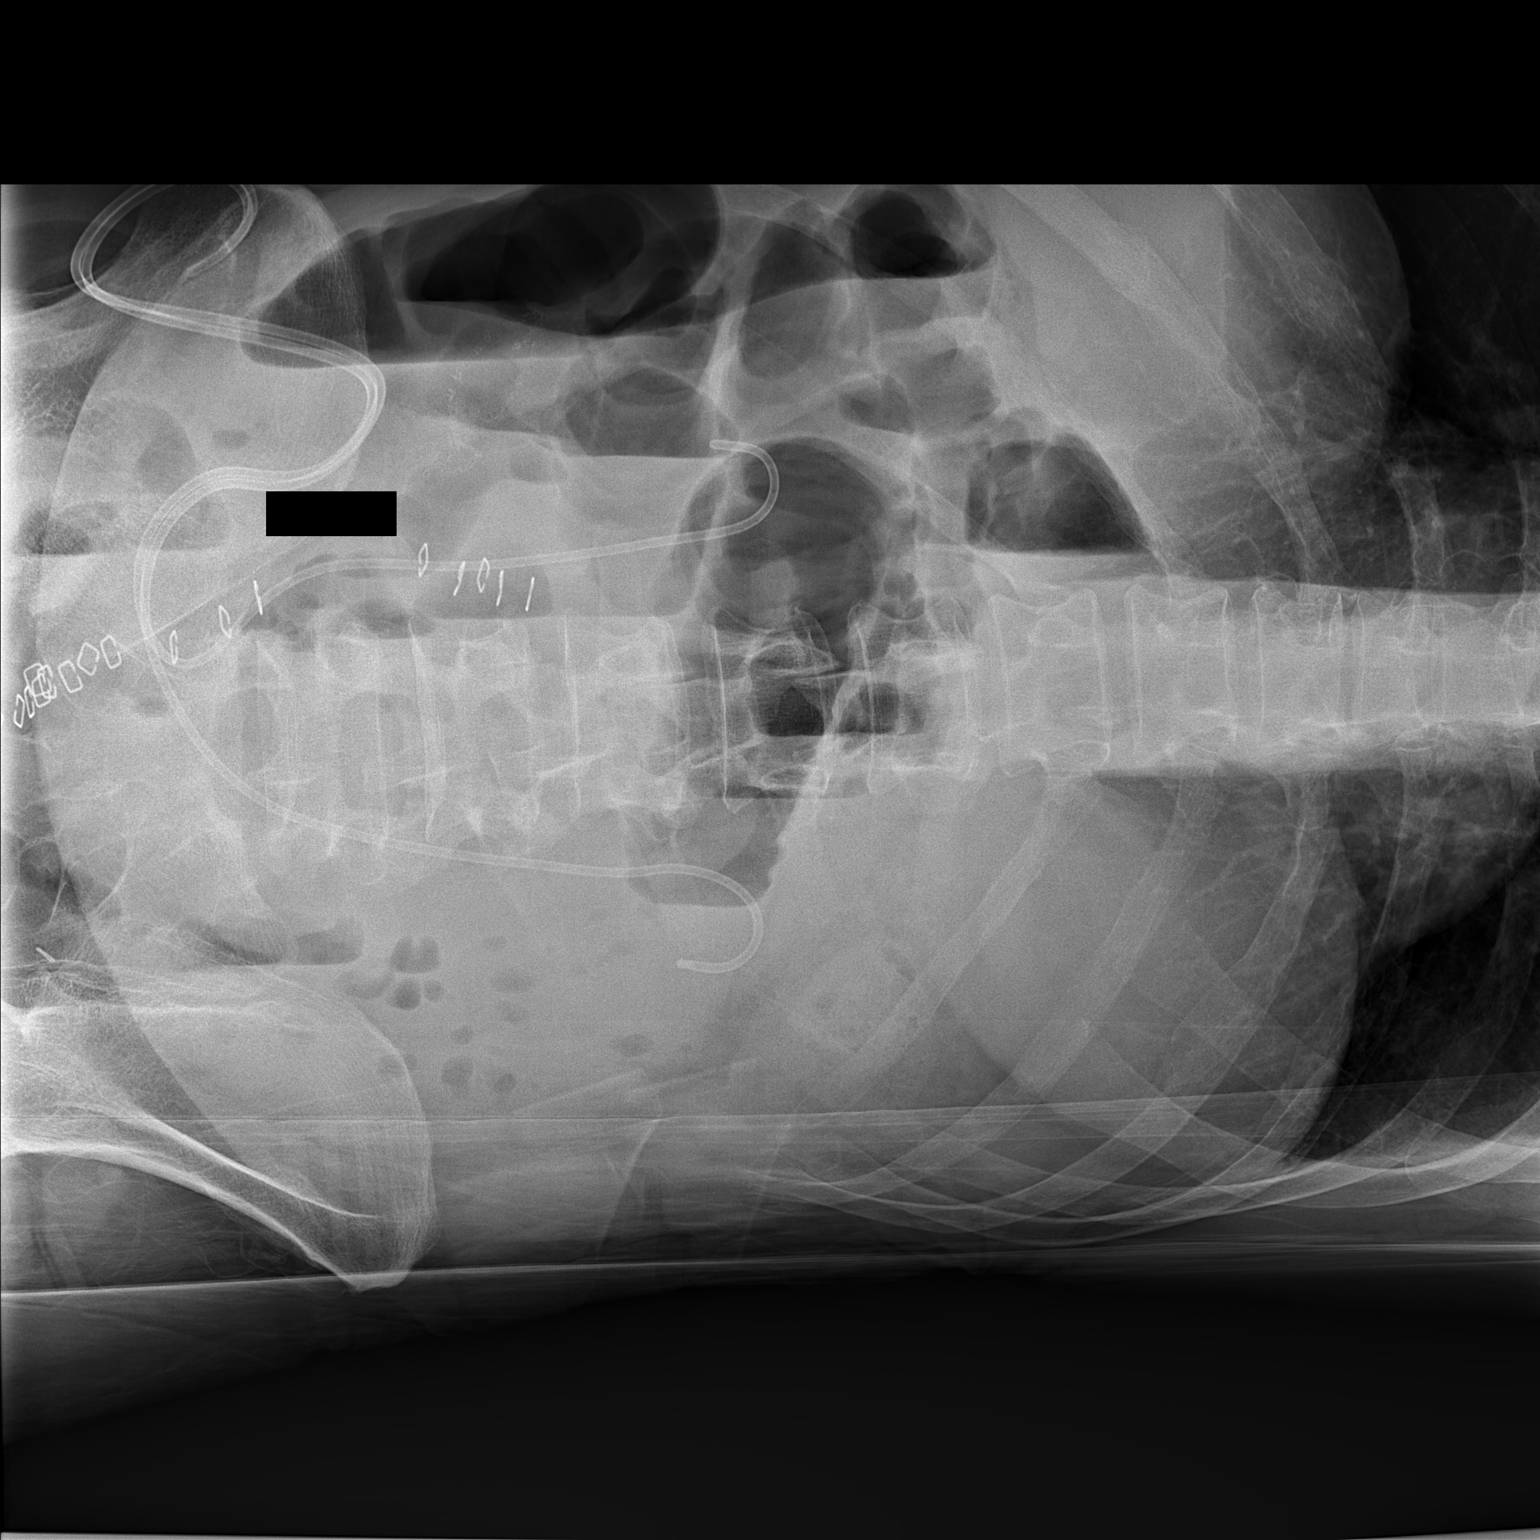

[x abdomen supine]
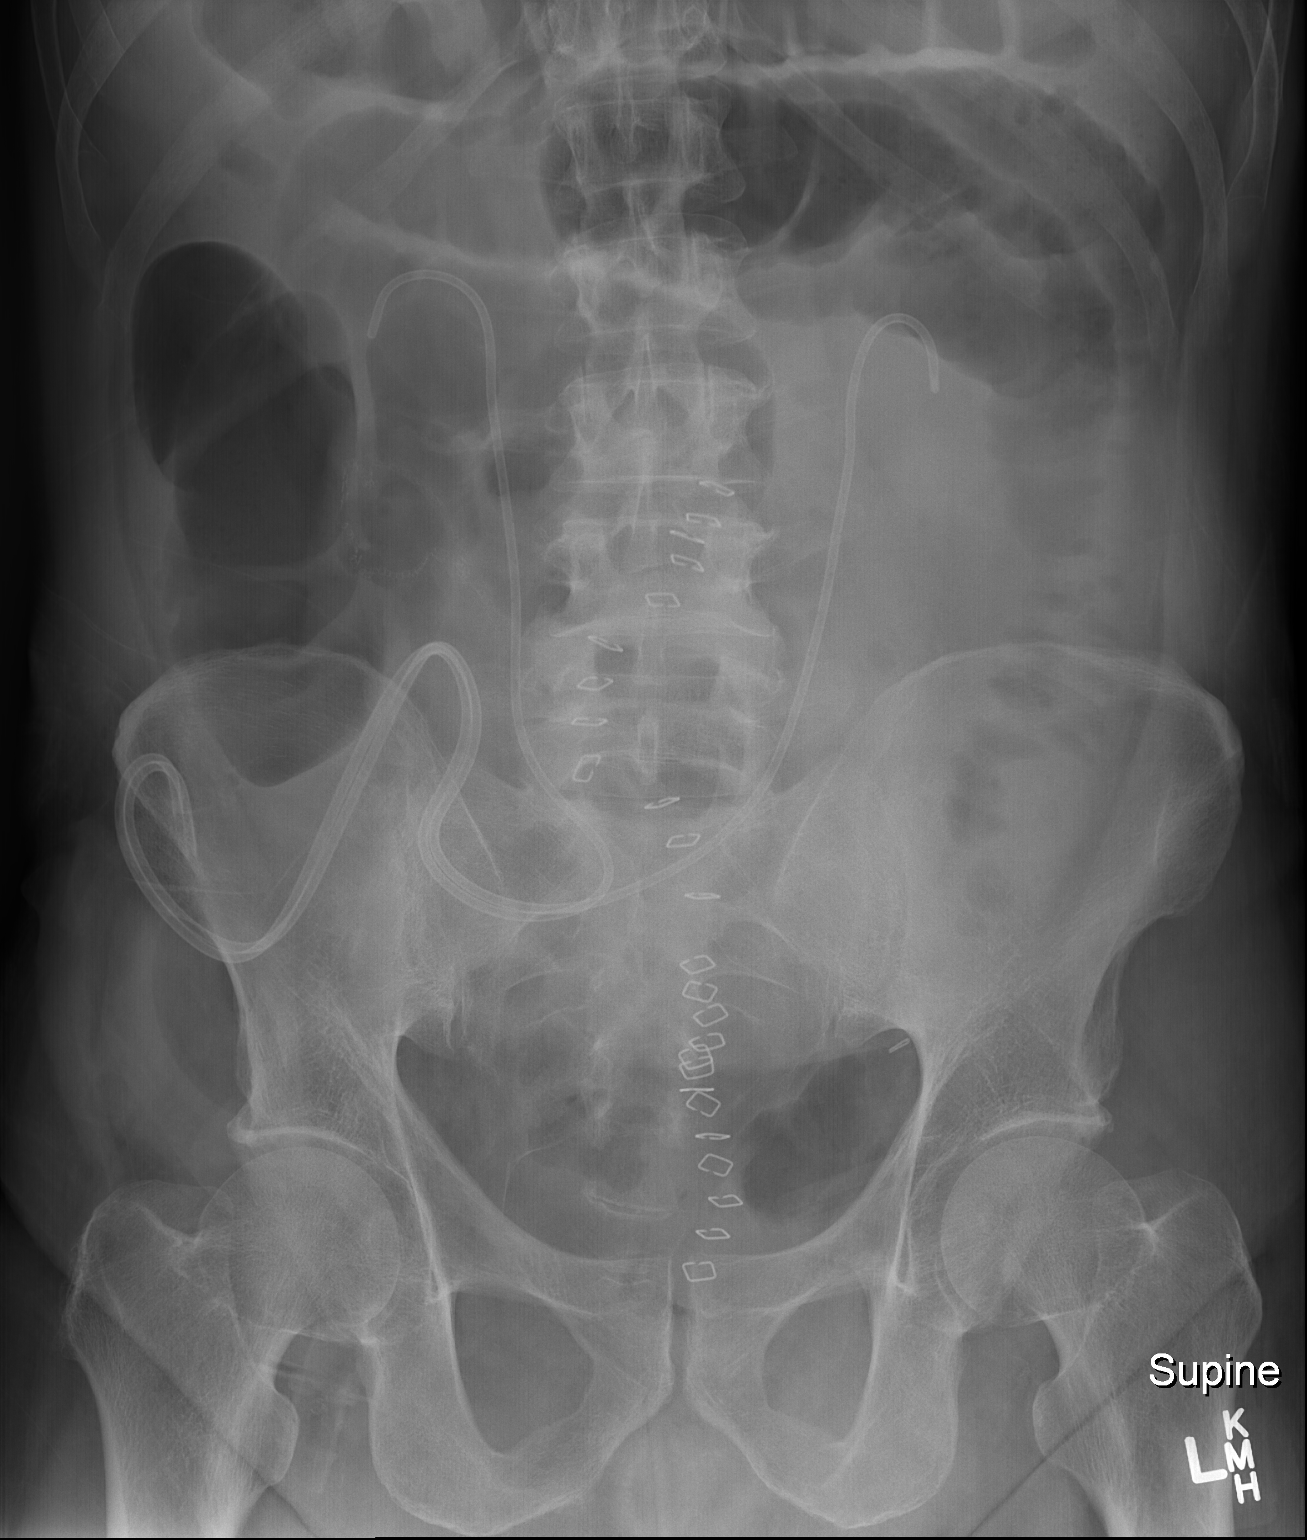

[2 of 2 positions shown; findings below may reference images not displayed]

FINDINGS: There are multiple air-fluid levels in small and large bowel. There
is no free air. Loops of small bowel are dilated to 5 cm and there
is little gas into the left colon. Bilateral percutaneous ureteral
stents are identified.
IMPRESSION: The findings are concerning for partial small bowel obstruction.
Consider CT of the abdomen and pelvis with contrast.

## 2016-06-07 IMAGING — CT CT CHEST W/ CM
2 of 5 series · 12 of 36 positions shown, 15 images · IV contrast (OMNIPAQUE 300)
Comparison: Abdominal pelvic CT 04/11/2015.

CLINICAL DATA: Possible sepsis. Radical cystectomy for urothelial
carcinoma 04/01/2015. History of prostate cancer and prostatectomy.

EXAM:
CT CHEST, ABDOMEN, AND PELVIS WITH CONTRAST
TECHNIQUE: Multidetector CT imaging of the chest, abdomen and pelvis was
performed following the standard protocol during bolus
administration of intravenous contrast.
CONTRAST:  100mL OMNIPAQUE IOHEXOL 300 MG/ML  SOLN

[Series 2: c/a/p with · axial · 0.74mm/px · z∈[-183,+317]mm · 9 of 116 slices shown, 12 images]
[im 8/116  mediastinal]
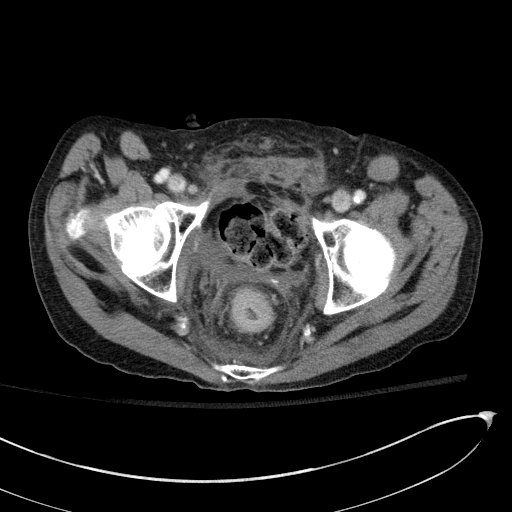
[im 8/116  lung]
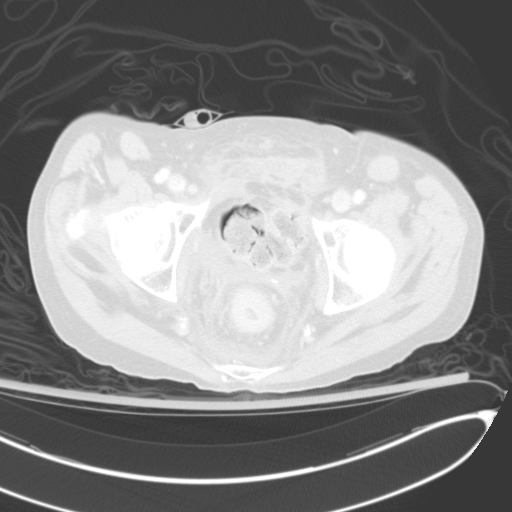
[im 24/116  lung]
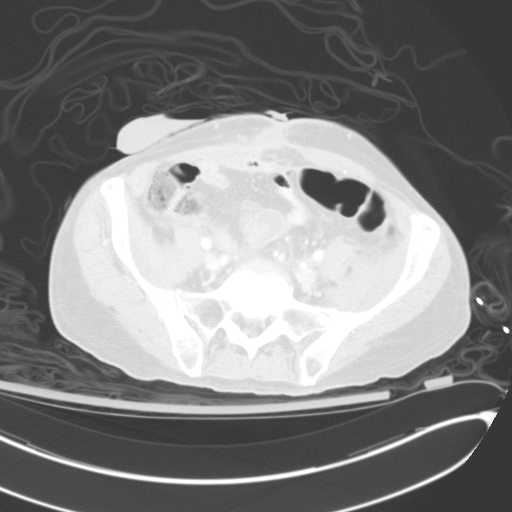
[im 31/116  lung]
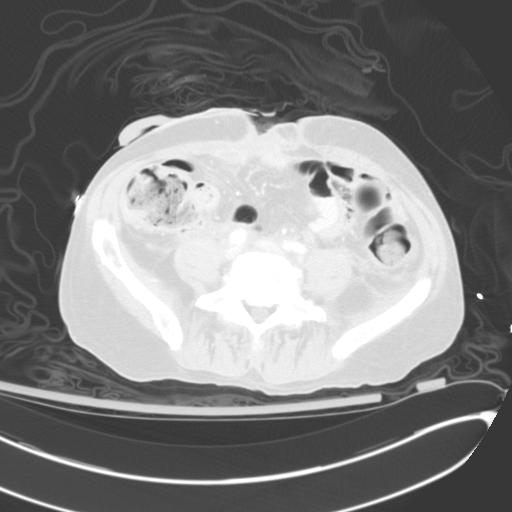
[im 47/116  lung]
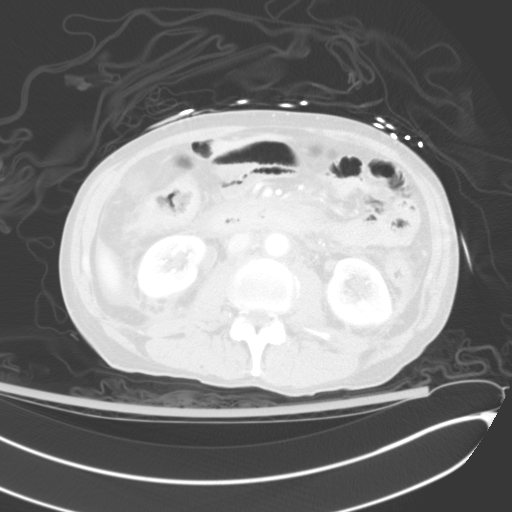
[im 62/116  mediastinal]
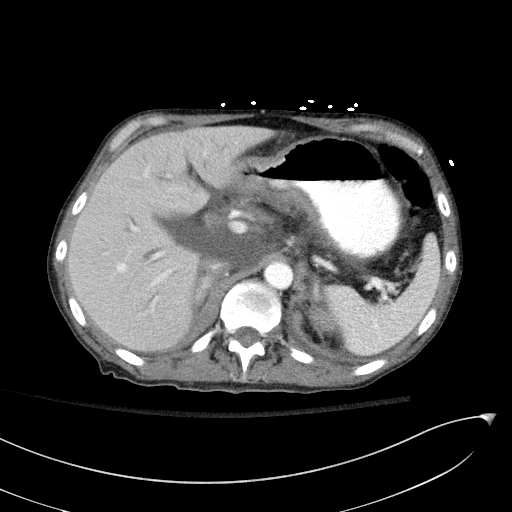
[im 62/116  lung]
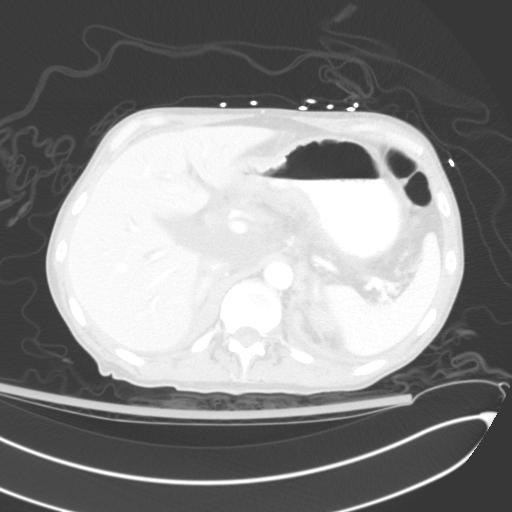
[im 70/116  lung]
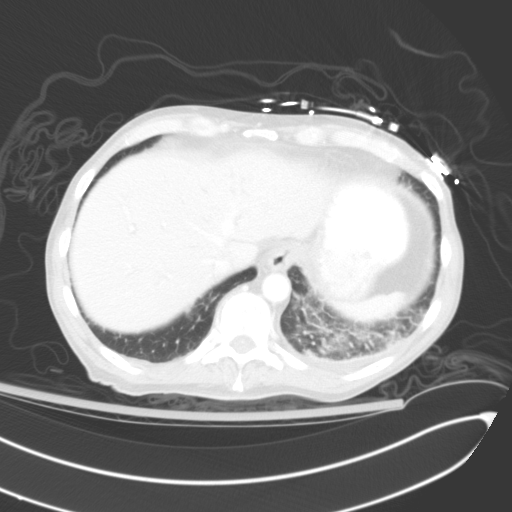
[im 85/116  lung]
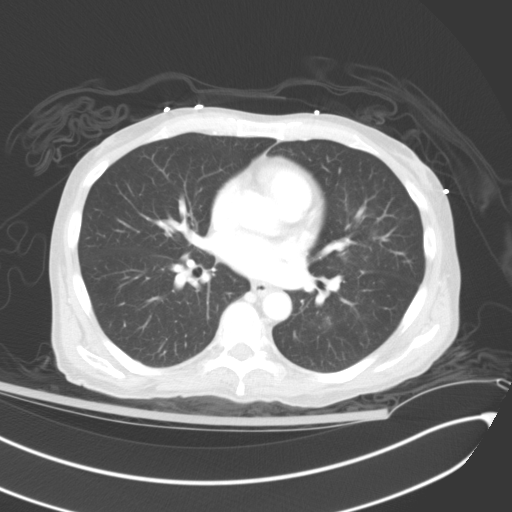
[im 93/116  lung]
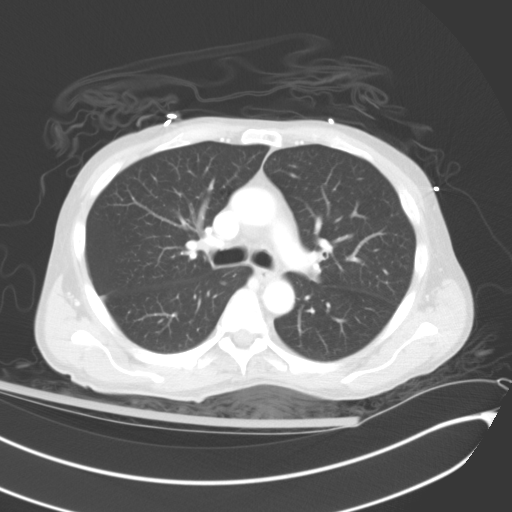
[im 108/116  mediastinal]
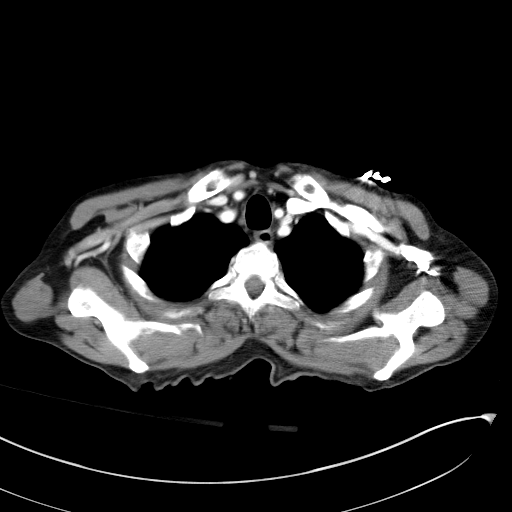
[im 108/116  lung]
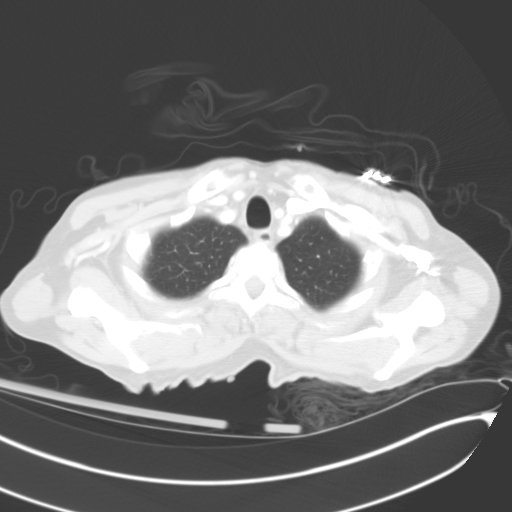

[Series 6: coronal · coronal · 0.74mm/px · 3 of 82 slices shown]
[im 17/82  lung]
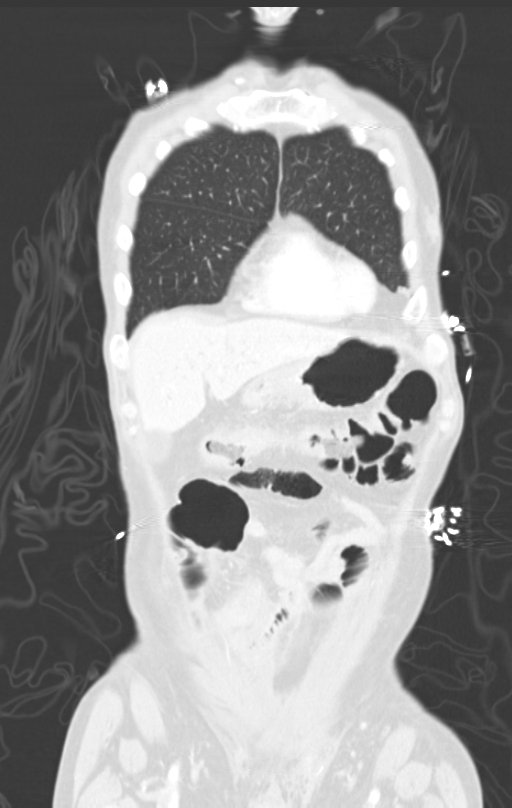
[im 33/82  lung]
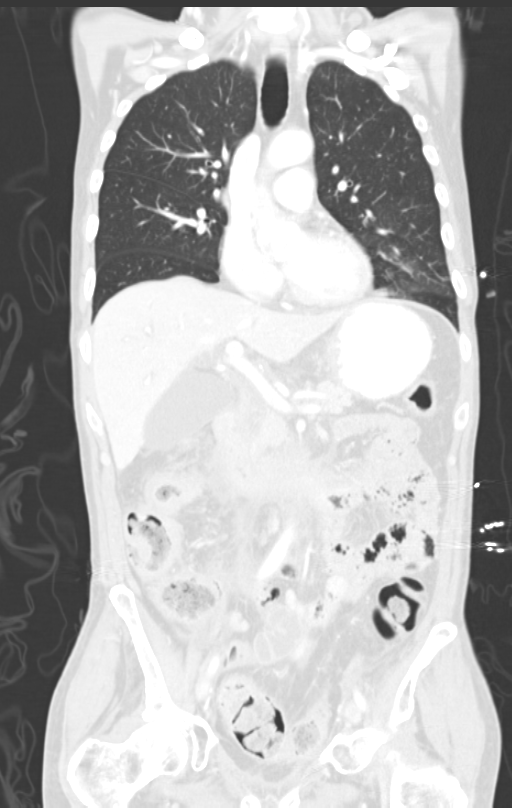
[im 49/82  lung]
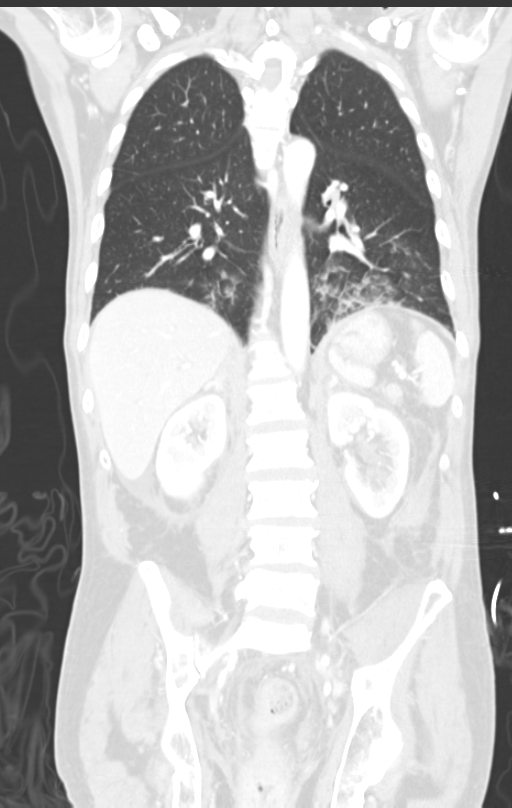

[12 of 36 positions shown; findings below may reference images not displayed]

FINDINGS: Mediastinum/Nodes: There are no enlarged mediastinal, hilar or
axillary lymph nodes. The thyroid gland, trachea and esophagus
demonstrate no significant findings. The heart size is normal. There
is no pericardial effusion. There are no significant vascular
findings.

Lungs/Pleura: There is no pleural effusion.As demonstrated on
earlier radiographs, there is patchy airspace disease at the left
lung base suspicious for pneumonia or aspiration. There is minimal
patchy airspace disease in the right lower lobe. At the right lung
apex, there is a 4 mm nodule on image 8. No other pulmonary nodules
are demonstrated.

Musculoskeletal/Chest wall: No chest wall mass or suspicious osseous
findings. There are several lower thoracic compression deformities
which appear chronic and unchanged.

CT ABDOMEN AND PELVIS FINDINGS

Hepatobiliary: There is new periportal edema throughout the liver.
The gallbladder is distended without wall thickening or calcified
stones. There is no biliary dilatation. No suspicious liver lesions
are identified. There is a questionable small low-density
subcapsular lesion right lobe measuring 6 mm on image 60, not
clearly seen previously.

Pancreas: Unremarkable. No pancreatic ductal dilatation or
surrounding inflammatory changes.

Spleen: Normal in size without focal abnormality.

Adrenals/Urinary Tract: Both adrenal glands appear normal. The
ureteral stents have been removed. Both kidneys demonstrate
collecting system dilatation without evidence of ureteral
obstruction on delayed images. There is no evidence of renal mass.
There is perinephric soft tissue stranding bilaterally. No
obstructing calculi are seen. There are postsurgical changes in
pelvis consistent with cystectomy and urinary diversion. Delayed
images through the ileal conduit were not obtained.

Stomach/Bowel: There is progressive wall thickening of the distal
stomach and duodenum. There is also wall thickening of the ascending
and transverse colon. There are increased inflammatory changes
throughout the upper mesentery and retroperitoneum. No focal
extraluminal fluid collection identified. A small amount of gas
superior to the transverse duodenum on image 69 corresponds with
diverticulum on the prior study. There is no drainable extraluminal
fluid collection. There is no significant residual bowel distension.

Vascular/Lymphatic: There are no enlarged abdominal or pelvic lymph
nodes. There is mild aortoiliac atherosclerosis.

Reproductive: Status post prostatectomy. A small amount of free
pelvic fluid is present.

Other: There are postsurgical changes within the anterior abdominal
wall. There is a small fluid collection within the midline incision.
There is some edema in the suprapubic region.

Musculoskeletal: No acute or significant osseous findings. No
evidence of osseous metastatic disease.
IMPRESSION: 1. CT confirms the presence of new left lower lobe airspace disease
suspicious for pneumonia or aspiration. There is also minimal right
lower lobe involvement.
2. New periportal edema in the liver with gallbladder distention,
but no biliary dilatation or gallbladder wall thickening.
3. Increased inflammatory changes in the upper abdomen with
mesenteric and retroperitoneal edema, wall thickening of the distal
stomach, duodenum and transverse colon. A unifying explanation for
these findings is not obvious. No significant residual bowel
obstruction or drainable fluid collection demonstrated.
4. Interval ureteral stent removal without evidence of ureteral
obstruction.
5. No evidence of metastatic disease.

## 2016-06-08 IMAGING — DX DG CHEST 2V
2 series · 2 of 2 positions shown · non-contrast
Comparison: CT 05/19/2015.

CLINICAL DATA: Dry cough.

EXAM:
CHEST  2 VIEW

[chest lat]
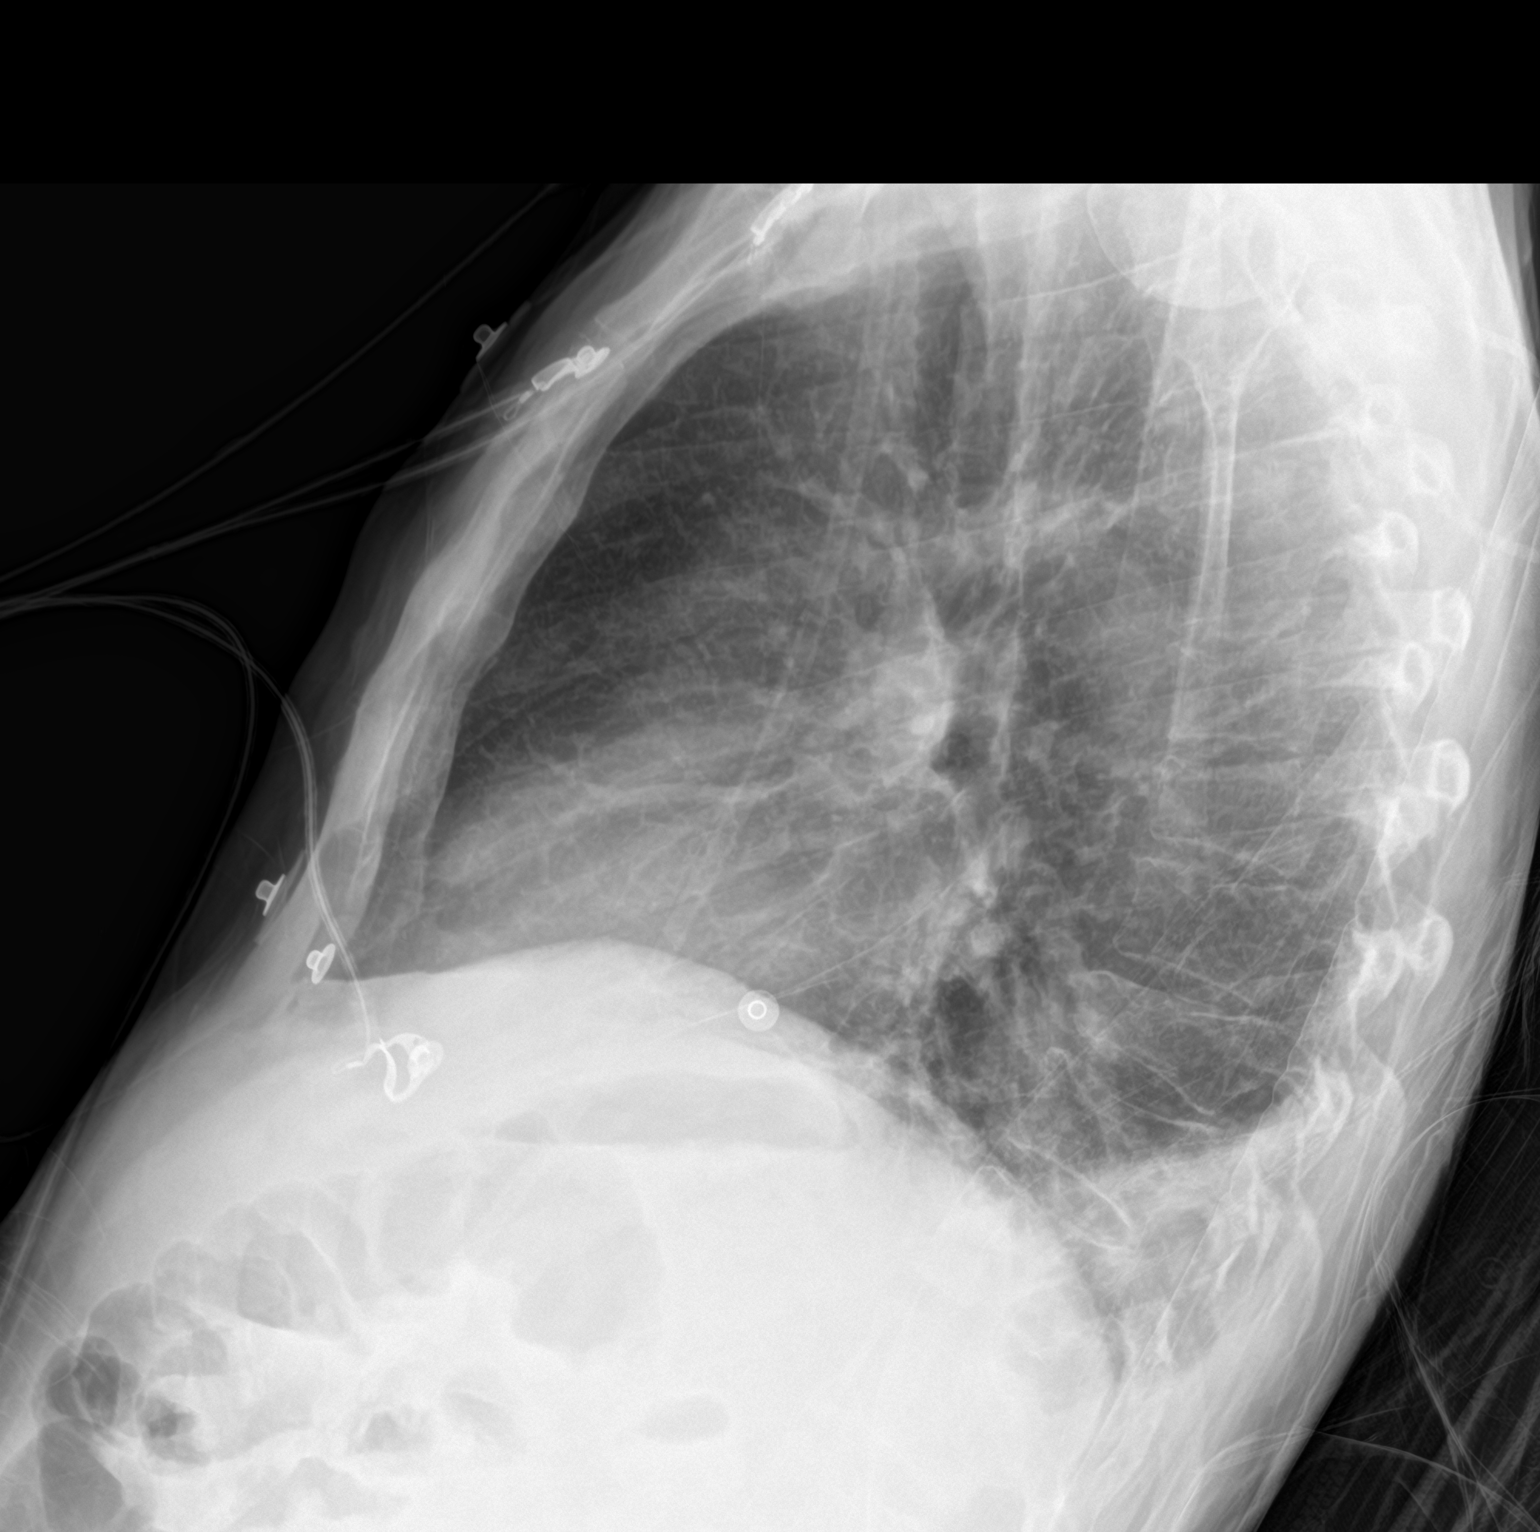

[chest ap]
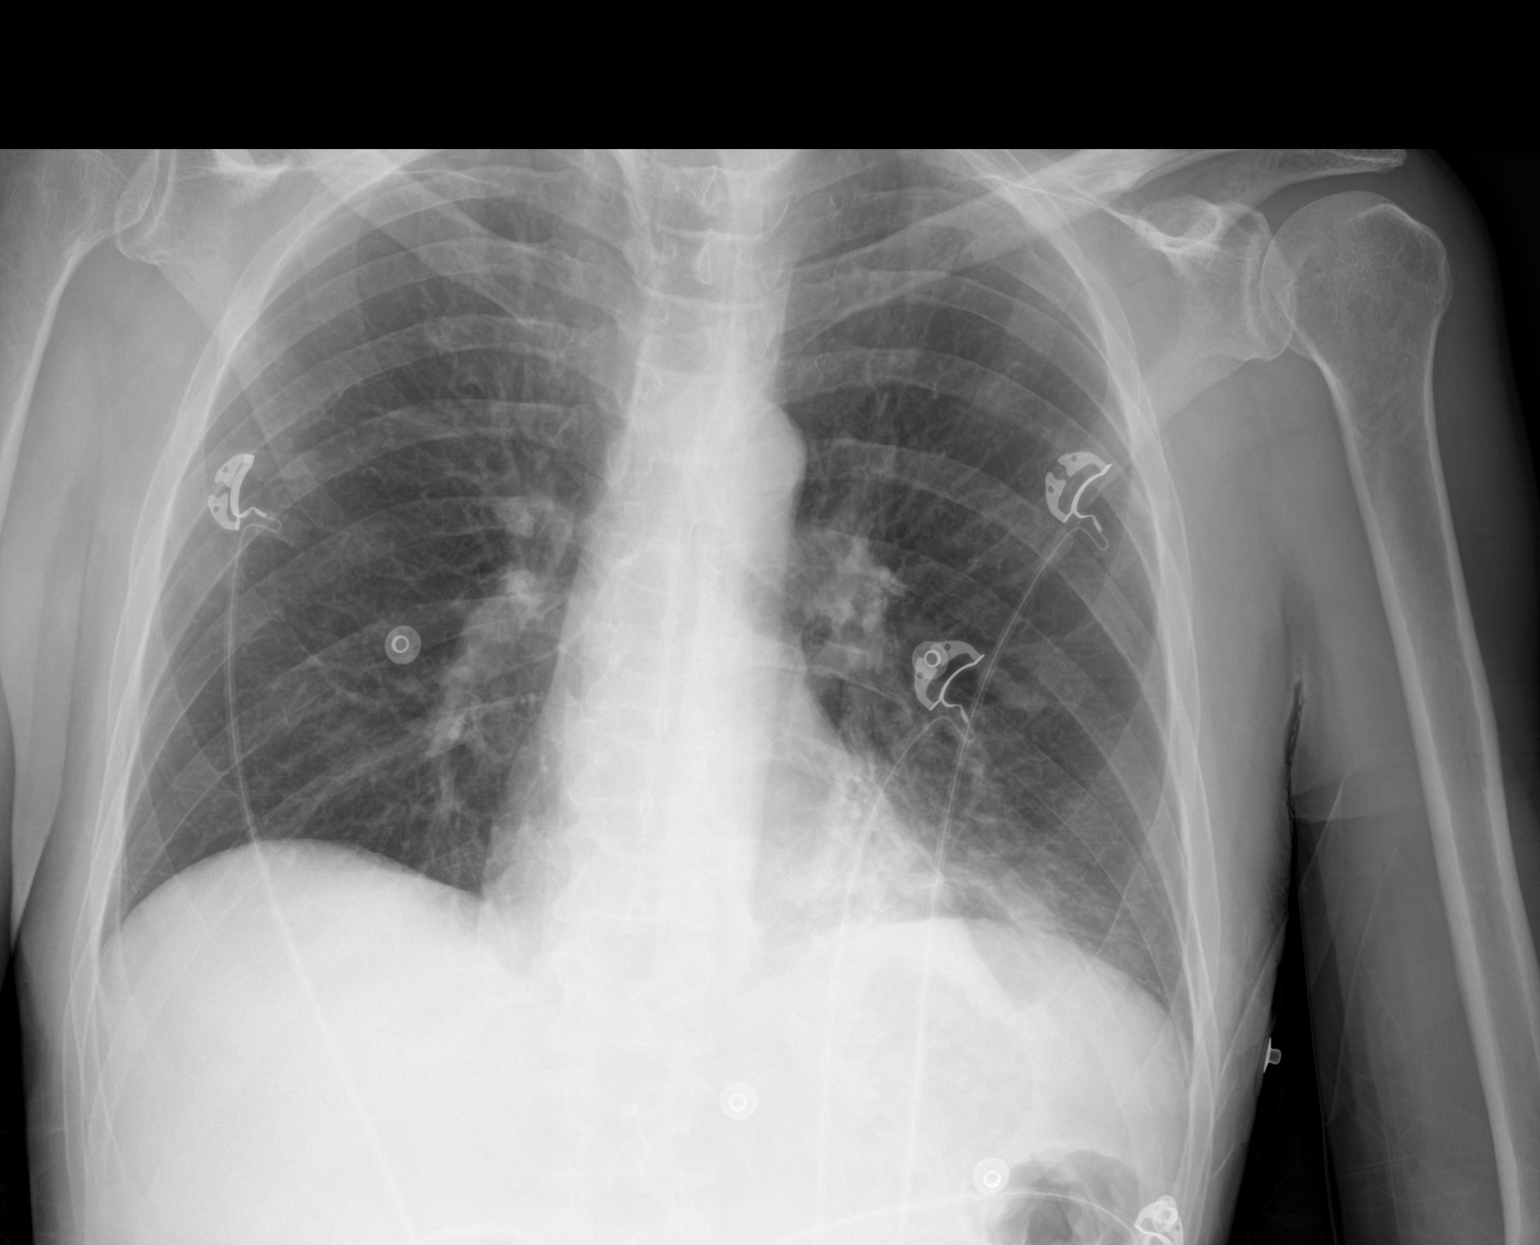

[2 of 2 positions shown; findings below may reference images not displayed]

FINDINGS: Mediastinum and hilar structures are normal. Persistent left lower
lobe infiltrate consistent pneumonia. No prominent pleural effusion
or pneumothorax. Heart size normal. Stable multiple thoracic spine
compression fractures.
IMPRESSION: Persistent left lower lobe infiltrate consistent pneumonia. No
definite interim change from prior CT of 05/19/2015 .
